# Patient Record
Sex: Female | Born: 1960 | Race: White | Hispanic: No | Marital: Single | Smoking: Light tobacco smoker
Health system: Southern US, Community
[De-identification: ages and names within clinical notes are randomized; demographics above are authoritative.]

## PROBLEM LIST (undated history)

## (undated) DIAGNOSIS — F329 Major depressive disorder, single episode, unspecified: Secondary | ICD-10-CM

## (undated) DIAGNOSIS — E785 Hyperlipidemia, unspecified: Secondary | ICD-10-CM

## (undated) DIAGNOSIS — H04129 Dry eye syndrome of unspecified lacrimal gland: Secondary | ICD-10-CM

## (undated) DIAGNOSIS — G40909 Epilepsy, unspecified, not intractable, without status epilepticus: Secondary | ICD-10-CM

## (undated) DIAGNOSIS — F319 Bipolar disorder, unspecified: Secondary | ICD-10-CM

## (undated) DIAGNOSIS — G43909 Migraine, unspecified, not intractable, without status migrainosus: Secondary | ICD-10-CM

## (undated) DIAGNOSIS — C801 Malignant (primary) neoplasm, unspecified: Secondary | ICD-10-CM

## (undated) DIAGNOSIS — R569 Unspecified convulsions: Secondary | ICD-10-CM

## (undated) DIAGNOSIS — I82409 Acute embolism and thrombosis of unspecified deep veins of unspecified lower extremity: Secondary | ICD-10-CM

## (undated) DIAGNOSIS — F419 Anxiety disorder, unspecified: Secondary | ICD-10-CM

## (undated) DIAGNOSIS — R238 Other skin changes: Secondary | ICD-10-CM

## (undated) DIAGNOSIS — R45851 Suicidal ideations: Secondary | ICD-10-CM

## (undated) DIAGNOSIS — I729 Aneurysm of unspecified site: Secondary | ICD-10-CM

## (undated) DIAGNOSIS — F32A Depression, unspecified: Secondary | ICD-10-CM

## (undated) DIAGNOSIS — R682 Dry mouth, unspecified: Secondary | ICD-10-CM

## (undated) DIAGNOSIS — K219 Gastro-esophageal reflux disease without esophagitis: Secondary | ICD-10-CM

## (undated) DIAGNOSIS — R233 Spontaneous ecchymoses: Secondary | ICD-10-CM

## (undated) DIAGNOSIS — I10 Essential (primary) hypertension: Secondary | ICD-10-CM

## (undated) DIAGNOSIS — I639 Cerebral infarction, unspecified: Secondary | ICD-10-CM

## (undated) DIAGNOSIS — R5383 Other fatigue: Secondary | ICD-10-CM

## (undated) DIAGNOSIS — E039 Hypothyroidism, unspecified: Secondary | ICD-10-CM

## (undated) HISTORY — PX: RADICAL HYSTERECTOMY: SHX2283

## (undated) HISTORY — PX: ABDOMINAL HYSTERECTOMY: SHX81

## (undated) HISTORY — DX: Gastro-esophageal reflux disease without esophagitis: K21.9

## (undated) HISTORY — DX: Anxiety disorder, unspecified: F41.9

## (undated) HISTORY — DX: Bipolar disorder, unspecified: F31.9

## (undated) HISTORY — DX: Epilepsy, unspecified, not intractable, without status epilepticus: G40.909

## (undated) HISTORY — DX: Hyperlipidemia, unspecified: E78.5

## (undated) HISTORY — DX: Depression, unspecified: F32.A

## (undated) HISTORY — DX: Migraine, unspecified, not intractable, without status migrainosus: G43.909

## (undated) HISTORY — DX: Hypothyroidism, unspecified: E03.9

## (undated) HISTORY — PX: CHOLECYSTECTOMY: SHX55

## (undated) HISTORY — DX: Major depressive disorder, single episode, unspecified: F32.9

## (undated) HISTORY — DX: Suicidal ideations: R45.851

---

## 1995-04-25 DIAGNOSIS — I639 Cerebral infarction, unspecified: Secondary | ICD-10-CM

## 1995-04-25 HISTORY — DX: Cerebral infarction, unspecified: I63.9

## 1995-04-25 HISTORY — PX: CEREBRAL ANEURYSM REPAIR: SHX164

## 2004-02-02 ENCOUNTER — Emergency Department: Payer: Self-pay | Admitting: Emergency Medicine

## 2004-02-03 ENCOUNTER — Ambulatory Visit: Payer: Self-pay | Admitting: Surgery

## 2004-02-26 ENCOUNTER — Other Ambulatory Visit: Payer: Self-pay

## 2004-02-26 ENCOUNTER — Emergency Department: Payer: Self-pay | Admitting: Internal Medicine

## 2004-03-03 ENCOUNTER — Emergency Department: Payer: Self-pay | Admitting: Emergency Medicine

## 2004-03-16 ENCOUNTER — Emergency Department: Payer: Self-pay | Admitting: Emergency Medicine

## 2004-03-16 ENCOUNTER — Other Ambulatory Visit: Payer: Self-pay

## 2006-08-20 ENCOUNTER — Ambulatory Visit: Payer: Self-pay | Admitting: *Deleted

## 2006-08-20 ENCOUNTER — Inpatient Hospital Stay (HOSPITAL_COMMUNITY): Admission: AD | Admit: 2006-08-20 | Discharge: 2006-08-27 | Payer: Self-pay | Admitting: *Deleted

## 2006-10-19 ENCOUNTER — Emergency Department (HOSPITAL_COMMUNITY): Admission: EM | Admit: 2006-10-19 | Discharge: 2006-10-19 | Payer: Self-pay | Admitting: Emergency Medicine

## 2006-10-22 ENCOUNTER — Emergency Department (HOSPITAL_COMMUNITY): Admission: EM | Admit: 2006-10-22 | Discharge: 2006-10-22 | Payer: Self-pay | Admitting: Emergency Medicine

## 2007-05-25 ENCOUNTER — Emergency Department (HOSPITAL_COMMUNITY): Admission: EM | Admit: 2007-05-25 | Discharge: 2007-05-25 | Payer: Self-pay | Admitting: Emergency Medicine

## 2007-08-10 ENCOUNTER — Emergency Department (HOSPITAL_COMMUNITY): Admission: EM | Admit: 2007-08-10 | Discharge: 2007-08-10 | Payer: Self-pay | Admitting: Emergency Medicine

## 2007-08-15 ENCOUNTER — Other Ambulatory Visit: Payer: Self-pay

## 2007-08-15 ENCOUNTER — Ambulatory Visit: Payer: Self-pay | Admitting: *Deleted

## 2007-08-16 ENCOUNTER — Inpatient Hospital Stay (HOSPITAL_COMMUNITY): Admission: RE | Admit: 2007-08-16 | Discharge: 2007-08-27 | Payer: Self-pay | Admitting: *Deleted

## 2007-09-20 ENCOUNTER — Emergency Department (HOSPITAL_COMMUNITY): Admission: EM | Admit: 2007-09-20 | Discharge: 2007-09-20 | Payer: Self-pay | Admitting: Emergency Medicine

## 2007-11-12 ENCOUNTER — Emergency Department (HOSPITAL_COMMUNITY): Admission: EM | Admit: 2007-11-12 | Discharge: 2007-11-12 | Payer: Self-pay | Admitting: Emergency Medicine

## 2007-11-22 ENCOUNTER — Emergency Department (HOSPITAL_COMMUNITY): Admission: EM | Admit: 2007-11-22 | Discharge: 2007-11-22 | Payer: Self-pay | Admitting: Occupational Medicine

## 2007-11-25 ENCOUNTER — Emergency Department (HOSPITAL_COMMUNITY): Admission: EM | Admit: 2007-11-25 | Discharge: 2007-11-25 | Payer: Self-pay | Admitting: Emergency Medicine

## 2007-12-19 ENCOUNTER — Inpatient Hospital Stay (HOSPITAL_COMMUNITY): Admission: EM | Admit: 2007-12-19 | Discharge: 2007-12-22 | Payer: Self-pay | Admitting: Emergency Medicine

## 2007-12-19 ENCOUNTER — Encounter (INDEPENDENT_AMBULATORY_CARE_PROVIDER_SITE_OTHER): Payer: Self-pay | Admitting: Neurology

## 2007-12-20 ENCOUNTER — Encounter (INDEPENDENT_AMBULATORY_CARE_PROVIDER_SITE_OTHER): Payer: Self-pay | Admitting: Neurology

## 2008-01-15 ENCOUNTER — Inpatient Hospital Stay (HOSPITAL_COMMUNITY): Admission: EM | Admit: 2008-01-15 | Discharge: 2008-01-16 | Payer: Self-pay | Admitting: Cardiovascular Disease

## 2008-01-28 ENCOUNTER — Emergency Department (HOSPITAL_COMMUNITY): Admission: EM | Admit: 2008-01-28 | Discharge: 2008-01-28 | Payer: Self-pay | Admitting: Emergency Medicine

## 2008-02-12 ENCOUNTER — Other Ambulatory Visit: Payer: Self-pay | Admitting: Emergency Medicine

## 2008-02-12 ENCOUNTER — Ambulatory Visit: Payer: Self-pay | Admitting: *Deleted

## 2008-02-12 ENCOUNTER — Inpatient Hospital Stay (HOSPITAL_COMMUNITY): Admission: AD | Admit: 2008-02-12 | Discharge: 2008-02-26 | Payer: Self-pay | Admitting: *Deleted

## 2008-02-17 ENCOUNTER — Encounter: Payer: Self-pay | Admitting: Emergency Medicine

## 2008-02-24 ENCOUNTER — Other Ambulatory Visit: Payer: Self-pay | Admitting: *Deleted

## 2008-03-06 ENCOUNTER — Other Ambulatory Visit: Payer: Self-pay | Admitting: Emergency Medicine

## 2008-03-07 ENCOUNTER — Other Ambulatory Visit: Payer: Self-pay | Admitting: Family Medicine

## 2008-03-07 ENCOUNTER — Inpatient Hospital Stay (HOSPITAL_COMMUNITY): Admission: EM | Admit: 2008-03-07 | Discharge: 2008-03-12 | Payer: Self-pay | Admitting: Psychiatry

## 2010-05-28 ENCOUNTER — Emergency Department (HOSPITAL_COMMUNITY)
Admission: EM | Admit: 2010-05-28 | Discharge: 2010-05-28 | Disposition: A | Discharge status: Home / Self Care | Payer: Medicare Other | Attending: Emergency Medicine | Admitting: Emergency Medicine

## 2010-05-28 DIAGNOSIS — H53149 Visual discomfort, unspecified: Secondary | ICD-10-CM | POA: Insufficient documentation

## 2010-05-28 DIAGNOSIS — G43909 Migraine, unspecified, not intractable, without status migrainosus: Secondary | ICD-10-CM | POA: Insufficient documentation

## 2010-05-28 DIAGNOSIS — R630 Anorexia: Secondary | ICD-10-CM | POA: Insufficient documentation

## 2010-05-28 DIAGNOSIS — R110 Nausea: Secondary | ICD-10-CM | POA: Insufficient documentation

## 2010-05-28 DIAGNOSIS — Z79899 Other long term (current) drug therapy: Secondary | ICD-10-CM | POA: Insufficient documentation

## 2010-05-28 DIAGNOSIS — G40909 Epilepsy, unspecified, not intractable, without status epilepticus: Secondary | ICD-10-CM | POA: Insufficient documentation

## 2010-06-02 ENCOUNTER — Inpatient Hospital Stay (HOSPITAL_COMMUNITY): Payer: Medicare Other

## 2010-06-02 ENCOUNTER — Inpatient Hospital Stay (HOSPITAL_COMMUNITY)
Admission: EM | Admit: 2010-06-02 | Discharge: 2010-06-04 | Disposition: A | Discharge status: Home / Self Care | Payer: Medicare Other | Source: Home / Self Care

## 2010-06-02 ENCOUNTER — Emergency Department (HOSPITAL_COMMUNITY): DRG: 313 | Payer: Medicare Other | Attending: Internal Medicine

## 2010-06-02 ENCOUNTER — Emergency Department (HOSPITAL_COMMUNITY): Payer: Medicare Other

## 2010-06-02 DIAGNOSIS — F319 Bipolar disorder, unspecified: Secondary | ICD-10-CM | POA: Diagnosis present

## 2010-06-02 DIAGNOSIS — T80818A Extravasation of other vesicant agent, initial encounter: Secondary | ICD-10-CM | POA: Diagnosis present

## 2010-06-02 DIAGNOSIS — G40909 Epilepsy, unspecified, not intractable, without status epilepticus: Secondary | ICD-10-CM | POA: Diagnosis present

## 2010-06-02 DIAGNOSIS — IMO0002 Reserved for concepts with insufficient information to code with codable children: Secondary | ICD-10-CM | POA: Diagnosis present

## 2010-06-02 DIAGNOSIS — Z8249 Family history of ischemic heart disease and other diseases of the circulatory system: Secondary | ICD-10-CM

## 2010-06-02 DIAGNOSIS — T50995A Adverse effect of other drugs, medicaments and biological substances, initial encounter: Secondary | ICD-10-CM | POA: Diagnosis present

## 2010-06-02 DIAGNOSIS — R0789 Other chest pain: Secondary | ICD-10-CM | POA: Diagnosis present

## 2010-06-02 DIAGNOSIS — E039 Hypothyroidism, unspecified: Secondary | ICD-10-CM | POA: Diagnosis present

## 2010-06-02 DIAGNOSIS — I10 Essential (primary) hypertension: Secondary | ICD-10-CM | POA: Diagnosis present

## 2010-06-02 DIAGNOSIS — E7800 Pure hypercholesterolemia, unspecified: Secondary | ICD-10-CM | POA: Diagnosis present

## 2010-06-02 DIAGNOSIS — Z86718 Personal history of other venous thrombosis and embolism: Secondary | ICD-10-CM

## 2010-06-02 HISTORY — DX: Essential (primary) hypertension: I10

## 2010-06-02 HISTORY — DX: Malignant (primary) neoplasm, unspecified: C80.1

## 2010-06-02 LAB — DIFFERENTIAL
Lymphocytes Relative: 26 % (ref 12–46)
Lymphs Abs: 2.1 10*3/uL (ref 0.7–4.0)
Neutrophils Relative %: 65 % (ref 43–77)

## 2010-06-02 LAB — CBC
HCT: 34.8 % — ABNORMAL LOW (ref 36.0–46.0)
MCV: 84.3 fL (ref 78.0–100.0)
Platelets: 275 10*3/uL (ref 150–400)
RBC: 4.13 MIL/uL (ref 3.87–5.11)
WBC: 8.1 10*3/uL (ref 4.0–10.5)

## 2010-06-02 LAB — BASIC METABOLIC PANEL
BUN: 10 mg/dL (ref 6–23)
Chloride: 103 mEq/L (ref 96–112)
Glucose, Bld: 97 mg/dL (ref 70–99)
Potassium: 3.1 mEq/L — ABNORMAL LOW (ref 3.5–5.1)

## 2010-06-02 LAB — POCT CARDIAC MARKERS
CKMB, poc: 1.3 ng/mL (ref 1.0–8.0)
Myoglobin, poc: 106 ng/mL (ref 12–200)
Troponin i, poc: <0.05 ng/mL (ref 0.00–0.09)

## 2010-06-02 LAB — PHENYTOIN LEVEL, TOTAL: Phenytoin Lvl: 6.2 ug/mL — ABNORMAL LOW (ref 10.0–20.0)

## 2010-06-03 ENCOUNTER — Observation Stay (HOSPITAL_COMMUNITY): Payer: Medicare Other

## 2010-06-03 ENCOUNTER — Encounter (HOSPITAL_COMMUNITY): Payer: Self-pay

## 2010-06-03 DIAGNOSIS — R072 Precordial pain: Secondary | ICD-10-CM

## 2010-06-03 LAB — LIPID PANEL
Cholesterol: 215 mg/dL — ABNORMAL HIGH (ref 0–200)
Total CHOL/HDL Ratio: 4.9 RATIO
VLDL: 60 mg/dL — ABNORMAL HIGH (ref 0–40)

## 2010-06-03 LAB — TSH: TSH: 1.703 u[IU]/mL (ref 0.350–4.500)

## 2010-06-03 LAB — CARDIAC PANEL(CRET KIN+CKTOT+MB+TROPI)
CK, MB: 1.4 ng/mL (ref 0.3–4.0)
Relative Index: INVALID (ref 0.0–2.5)
Relative Index: INVALID (ref 0.0–2.5)
Total CK: 120 U/L (ref 7–177)
Troponin I: 0.02 ng/mL (ref 0.00–0.06)

## 2010-06-03 MED ORDER — TECHNETIUM TO 99M ALBUMIN AGGREGATED
5.0000 | Freq: Once | INTRAVENOUS | Status: AC | PRN
  Administered 2010-06-03: 5.4 via INTRAVENOUS

## 2010-06-03 MED ORDER — XENON XE 133 GAS
10.0000 | GAS_FOR_INHALATION | Freq: Once | RESPIRATORY_TRACT | Status: AC | PRN
  Administered 2010-06-03: 16.1 via RESPIRATORY_TRACT

## 2010-06-04 LAB — DIFFERENTIAL
Basophils Absolute: 0 10*3/uL (ref 0.0–0.1)
Basophils Relative: 1 % (ref 0–1)
Neutro Abs: 2.3 10*3/uL (ref 1.7–7.7)
Neutrophils Relative %: 48 % (ref 43–77)

## 2010-06-04 LAB — CBC
Hemoglobin: 11.9 g/dL — ABNORMAL LOW (ref 12.0–15.0)
Platelets: 234 10*3/uL (ref 150–400)
RBC: 3.89 MIL/uL (ref 3.87–5.11)
WBC: 4.8 10*3/uL (ref 4.0–10.5)

## 2010-06-04 LAB — COMPREHENSIVE METABOLIC PANEL
ALT: 44 U/L — ABNORMAL HIGH (ref 0–35)
AST: 37 U/L (ref 0–37)
Albumin: 3.4 g/dL — ABNORMAL LOW (ref 3.5–5.2)
CO2: 27 mEq/L (ref 19–32)
Chloride: 109 mEq/L (ref 96–112)
GFR calc Af Amer: >60 mL/min (ref >60)
GFR calc non Af Amer: 53 mL/min — ABNORMAL LOW (ref >60)
Sodium: 145 mEq/L (ref 135–145)
Total Bilirubin: 0.4 mg/dL (ref 0.3–1.2)

## 2010-06-05 NOTE — Discharge Summary (Signed)
[place], [place] Jensen            ACCOUNT NO.:  [id number]  MEDICAL RECORD NO.:  [id number]           PATIENT TYPE:  I  LOCATION:  A310                          FACILITY:  [facility]  PHYSICIAN:  Wilson Singer, M.D.DATE OF BIRTH:  [birth year]  DATE OF ADMISSION:  06/02/2010 DATE OF DISCHARGE:  02/11/2012LH                              DISCHARGE SUMMARY   FINAL DISCHARGE DIAGNOSES: 1. Atypical chest pain, cardiac ischemia ruled out. 2. No evidence of pulmonary embolism.  CONDITION ON DISCHARGE:  Stable.  MEDICATIONS ON DISCHARGE:  Continue home medications which include Risperdal 1 mg daily, TriCor 145 mg daily, verapamil SR 360 mg daily, Celexa 40 mg  nightly, spironolactone 100 mg daily, labetalol 200 mg b.i.d., Lipitor 20 mg nightly, Lasix 40 mg daily, Ambien 10 mg nightly, Synthroid 75 mcg daily, and Dilantin 200 mg b.i.d.  NEW MEDICATION:  Levaquin 5 mg daily for 5 days.  HISTORY:  This 50 year old lady was admitted to the hospital with complaints of substernal chest pain and pressure.  She described the pain as sharp in nature but not necessarily pleuritic.  Please see initial history and physical examination by Dr. Marguerite Olea.  HOSPITAL PROGRESS:  The patient was admitted and serial cardiac enzymes were done which were negative.  Electrocardiogram showed normal sinus rhythm with incomplete right bundle-branch block, nonspecific T-wave changes.  The patient had recently had a cardiac stress test within the last couple of months ago, and this was negative.  When the patient was reviewed in the emergency room, she had an elevated D-dimer at 0.86.  A CT angiogram was attempted but the dye that was used extravasated into the left forearm, and therefore the procedure was abandoned.  Subsequently, the patient underwent a V/Q scan which showed a very low probability of an acute pulmonary embolism.  In fact her history is not consistent with this either.  She also underwent  an echocardiogram while in the hospital and this showed that she had normal ejection fraction and normal systolic function with ejection fraction of 60-65%.  Chest x-ray was also done which showed no evidence of cardiopulmonary disease.  The patient's chest pain settled down and on the day of discharge she was stable.  She did not have any further complaints of chest pain.  PHYSICAL EXAMINATION:  VITAL SIGNS:  On physical examination, temperature 98.2, blood pressure 142/75, pulse 82, saturation 96% on room air. CARDIOVASCULAR:  Heart sounds are present and normal with no pericardial rub. CHEST:  Lung fields were clear with no evidence of pleural rub. ABDOMEN:  Soft and nontender with no hepatosplenomegaly.  LABORATORY DATA:  Lab work showed TSH in the normal range of 1.703.  A lipid panel did show elevation of triglycerides at 299.  As mentioned before, cardiac markers were negative.  Electrolytes showed a normal BUN of 10 and creatinine of 1.03, hemoglobin 12.6, white blood cell count 8.1, and platelets 275.  DISPOSITION:  The patient is stable to be discharged home and examination of the left elbow area and forearm area where dye was given for the CT angiogram did not show any convincing evidence of cellulitis, but I  suppose there may be mild infection, and therefore I am going to send her home on a 5-day course of Levaquin.  She does not have a primary care physician in town at the present time because she just moved here from [place].  We will give her name to follow up with as an unassigned patient and hopefully she will do this.     Wilson Singer, M.D.     NCG/MEDQ  D:  06/04/2010  T:  06/04/2010  Job:  [id number]  Electronically Signed by Lilly Cove M.D. on 06/05/2010 06:51:19 AM

## 2010-06-14 NOTE — H&P (Signed)
NAME:  Robin Jensen, Robin Jensen NO.:  [id number]  MEDICAL RECORD NO.:  [id number]           PATIENT TYPE:  E  LOCATION:  APED                          FACILITY:  [facility]  PHYSICIAN:  Houston Siren, MD           DATE OF BIRTH:  1961-02-13  DATE OF ADMISSION:  06/02/2010 DATE OF DISCHARGE:  LH                             HISTORY & PHYSICAL   PRIMARY CARE PHYSICIAN:  Unassigned to hospitalist.  REASON FOR ADMISSION:  Chest pain.  Advance directive is full code.  HISTORY OF PRESENT ILLNESS:  This is a 50 year old female with history of bipolar disorder and other psychiatric disorders with prior suicide attempt, hypothyroidism, seizure disorder, hypercholesterolemia, polysubstance abuse, presented to the emergency room with 1-2 hours of substernal chest pain and pressure.  She had some nausea but no vomiting and her pain is nonpleuritic.  She denies any diaphoresis, shortness of breath, fever, chills or cough..  Of note, 1 week ago she was admitted to [facility] for chest pain and was seen in consultation with Cardiology who felt that her pain was noncardiac.  She also reports having a "negative stress test" several months ago.  I do not have a copy of the stress test to verify this.  She stated her pain came at rest and had no radiation.  Workup in the emergency room included a D-dimer which was slightly elevated at 0.86.  EKG showed nonspecific ST-T changes in the precordial leads.  Her chest x-ray is clear and her cardiac markers were negative.  She had normal white count and normal hemoglobin of 12.6.  She stated she had 2 episodes of what sound like a seizure where she lost her bladder and was tremulous, followed by feeling somnolence, but she said that she never lost consciousness.  Her Dilantin level was low at 6.2 and she was given 500 mg IV bolus.  She was not able to get a CT pulmonary angiogram because she has dye extravasation in her left arm.   Surgeon Dr. Lovell Sheehan was consulted.  Currently, she is comfortable, not short of breath and hospitalist was asked to admit the patient because of her chest pain and recent questionable seizure.  PAST MEDICAL HISTORY:  Bipolar disorder, posttraumatic stress syndrome, depression, hypothyroidism, history of cerebral aneurysm, cerebral clipping in 1996, hypertension, seizure disorder, history of lower extremity DVT.  SOCIAL HISTORY:  She denied current use of tobacco or alcohol products. She denied cocaine and marijuana use but it should be noted that her UDS showed presence of THC.  FAMILY HISTORY:  Father died of coronary artery disease in his 80.  She has hypertension and diabetes.  MEDICATIONS: 1. Dilantin 200 mg b.i.d. 2. Synthroid 75 mcg per day. 3. Ambien. 4. Lasix 40 mg per day. 5. Lipitor 20 mg per day. 6. Labetalol 200 mg b.i.d. 7. Spironolactone 100 mg daily. 8. Celexa 40 mg nightly. 9. Verapamil 300 mg daily. 10.TriCor 145 mg per day. 11.Risperdal 1 mg daily.  REVIEW OF SYSTEMS:  Otherwise unremarkable.  PHYSICAL EXAMINATION:  VITAL SIGNS:  Blood pressure 130/80, pulse of 80, respiratory rate  of 18, temperature 98.4. GENERAL:  She is alert and oriented, is in no apparent distress.  She appears slightly lethargic but answers questions appropriately.  HEENT: She has facial symmetry and her speech is fluent.  Tongue is midline. NECK:  Supple.  No stridor. CARDIAC:  S1 and S2, regular.  I did not hear any murmur, rub or gallop. LUNGS:  Clear.  No wheezes, rales or any evidence of consolidation. ABDOMEN:  Soft, nondistended, nontender. EXTREMITIES:  No edema.  No calf tenderness.  Good distal pulses bilaterally. SKIN:  Warm and dry. NEUROLOGIC:  Unremarkable. PSYCHIATRIC:  Unremarkable as well.  OBJECTIVE FINDINGS:  EKG; sinus rhythm at 80, axis is 0 degrees.  She has ST depression in the precordial leads from V1-V5 but no acute ST-T changes.  D-dimer 0.86.  Chest  x-ray is clear.  White count of 8.1 thousand, hemoglobin of 12.6, potassium of 3.1, creatinine 1.03, Dilantin 6.2.  IMPRESSION:  This is a 50 year old female presented with recurrent atypical chest pains.  She does have significantly increased risks for coronary artery disease.  We will admit her for rule out.  Because she has history of deep vein thrombosis, and the fact that she has elevated D- dimer albeit slightly, we will put her on full-dose Lovenox, and we will get a VQ scan tomorrow.  With respect to her cardiac medication, we will place her on aspirin, continue her beta-blocker and continue her statin. Differentials would include esophageal spasm and even Prinzmetal angina. I do not think she has acute coronary artery syndrome.  Having said that, recurrent atypical chest pains is certainly an indication for cardiac catheterization to define anatomy as well.  With this thought in mind, please consult Cardiology tomorrow.  She did have a questionable seizure today and her Dilantin level is low at 6.2.  She was given 500 mg IV bolus, and I would like to increase her total daily Dilantin from 400 to 450 mg in divided dosages.  She is otherwise stable.  I will continue all of her other medications as well.  We will give her 1 inch of Nitropaste to chest wall.  Because she has hypothyroidism, we will continue Synthroid and check a TSH.  She is a full code, will be admitted to [facility].  PROBLEM LIST: 1. Atypical chest pain, recurrent 2. Family history of coronary artery disease. 3. Seizure with history of seizure, subtherapeutic Dilantin, bipolar     disorder and other psychiatric problems, hypercholesterolemia,     hypothyroidism, history of polysubstance abuse, rule out pulmonary     embolism, left arm dye extravasation, full code.     Houston Siren, MD     PL/MEDQ  D:  06/02/2010  T:  06/02/2010  Job:  [id number]  Electronically Signed by Houston Siren  on 06/14/2010  03:09:25 AM

## 2010-07-14 ENCOUNTER — Observation Stay (HOSPITAL_COMMUNITY)
Admission: EM | Admit: 2010-07-14 | Discharge: 2010-07-16 | Disposition: A | Discharge status: Home / Self Care | Payer: Medicare Other | Attending: Internal Medicine | Admitting: Internal Medicine

## 2010-07-14 ENCOUNTER — Emergency Department (HOSPITAL_COMMUNITY): Payer: Medicare Other

## 2010-07-14 DIAGNOSIS — F319 Bipolar disorder, unspecified: Secondary | ICD-10-CM | POA: Insufficient documentation

## 2010-07-14 DIAGNOSIS — E039 Hypothyroidism, unspecified: Secondary | ICD-10-CM | POA: Insufficient documentation

## 2010-07-14 DIAGNOSIS — F3289 Other specified depressive episodes: Secondary | ICD-10-CM | POA: Insufficient documentation

## 2010-07-14 DIAGNOSIS — F411 Generalized anxiety disorder: Secondary | ICD-10-CM | POA: Insufficient documentation

## 2010-07-14 DIAGNOSIS — R0789 Other chest pain: Principal | ICD-10-CM | POA: Insufficient documentation

## 2010-07-14 DIAGNOSIS — Z79899 Other long term (current) drug therapy: Secondary | ICD-10-CM | POA: Insufficient documentation

## 2010-07-14 DIAGNOSIS — F329 Major depressive disorder, single episode, unspecified: Secondary | ICD-10-CM | POA: Insufficient documentation

## 2010-07-14 DIAGNOSIS — Z23 Encounter for immunization: Secondary | ICD-10-CM | POA: Insufficient documentation

## 2010-07-14 DIAGNOSIS — I10 Essential (primary) hypertension: Secondary | ICD-10-CM | POA: Insufficient documentation

## 2010-07-14 DIAGNOSIS — E785 Hyperlipidemia, unspecified: Secondary | ICD-10-CM | POA: Insufficient documentation

## 2010-07-14 LAB — POCT CARDIAC MARKERS
CKMB, poc: <1 ng/mL — ABNORMAL LOW (ref 1.0–8.0)
CKMB, poc: <1 ng/mL — ABNORMAL LOW (ref 1.0–8.0)
Myoglobin, poc: 144 ng/mL (ref 12–200)
Myoglobin, poc: 153 ng/mL (ref 12–200)

## 2010-07-14 LAB — BASIC METABOLIC PANEL
Calcium: 9.5 mg/dL (ref 8.4–10.5)
Creatinine, Ser: 1.01 mg/dL (ref 0.4–1.2)
GFR calc Af Amer: >60 mL/min (ref >60)
Sodium: 136 mEq/L (ref 135–145)

## 2010-07-14 LAB — URINALYSIS, ROUTINE W REFLEX MICROSCOPIC
Bilirubin Urine: NEGATIVE
Glucose, UA: NEGATIVE mg/dL
Hgb urine dipstick: NEGATIVE
Ketones, ur: NEGATIVE mg/dL
Protein, ur: NEGATIVE mg/dL

## 2010-07-14 LAB — DIFFERENTIAL
Basophils Absolute: 0 10*3/uL (ref 0.0–0.1)
Basophils Relative: 0 % (ref 0–1)
Eosinophils Absolute: 0 10*3/uL (ref 0.0–0.7)
Monocytes Relative: 8 % (ref 3–12)
Neutro Abs: 5.4 10*3/uL (ref 1.7–7.7)
Neutrophils Relative %: 64 % (ref 43–77)

## 2010-07-14 LAB — HEPATIC FUNCTION PANEL
AST: 27 U/L (ref 0–37)
Bilirubin, Direct: 0.1 mg/dL (ref 0.0–0.3)
Indirect Bilirubin: 0.3 mg/dL (ref 0.3–0.9)
Total Bilirubin: 0.4 mg/dL (ref 0.3–1.2)

## 2010-07-14 LAB — CBC
MCH: 29.7 pg (ref 26.0–34.0)
MCHC: 34.5 g/dL (ref 30.0–36.0)
Platelets: 271 10*3/uL (ref 150–400)
RBC: 4.31 MIL/uL (ref 3.87–5.11)

## 2010-07-14 LAB — RAPID URINE DRUG SCREEN, HOSP PERFORMED
Amphetamines: NOT DETECTED
Benzodiazepines: POSITIVE — AB
Cocaine: NOT DETECTED
Opiates: NOT DETECTED
Tetrahydrocannabinol: NOT DETECTED

## 2010-07-14 LAB — LIPASE, BLOOD: Lipase: 23 U/L (ref 11–59)

## 2010-07-15 ENCOUNTER — Observation Stay (HOSPITAL_COMMUNITY): Payer: Medicaid Other

## 2010-07-15 ENCOUNTER — Encounter (HOSPITAL_COMMUNITY): Payer: Self-pay

## 2010-07-15 ENCOUNTER — Observation Stay (HOSPITAL_COMMUNITY): Payer: Medicare Other

## 2010-07-15 DIAGNOSIS — R079 Chest pain, unspecified: Secondary | ICD-10-CM

## 2010-07-15 LAB — CARDIAC PANEL(CRET KIN+CKTOT+MB+TROPI)
Relative Index: 1.3 (ref 0.0–2.5)
Relative Index: 1.3 (ref 0.0–2.5)
Troponin I: 0.02 ng/mL (ref 0.00–0.06)
Troponin I: 0.02 ng/mL (ref 0.00–0.06)
Troponin I: 0.03 ng/mL (ref 0.00–0.06)

## 2010-07-15 MED ORDER — TECHNETIUM TC 99M TETROFOSMIN IV KIT
30.0000 | PACK | Freq: Once | INTRAVENOUS | Status: AC | PRN
  Administered 2010-07-15: 31 via INTRAVENOUS

## 2010-07-15 MED ORDER — TECHNETIUM TC 99M TETROFOSMIN IV KIT
10.0000 | PACK | Freq: Once | INTRAVENOUS | Status: AC | PRN
  Administered 2010-07-15: 11 via INTRAVENOUS

## 2010-07-15 MED ORDER — TECHNETIUM TC 99M MEBROFENIN IV KIT: 5.0000 | PACK | Freq: Once | INTRAVENOUS | Status: AC | PRN

## 2010-07-15 NOTE — Consult Note (Signed)
[place]  ADEA, GEISEL NO.:  [id number]  MEDICAL RECORD NO.:  [id number]           PATIENT TYPE:  LOCATION:                                 FACILITY:  PHYSICIAN:  Jonelle Sidle, MD DATE OF BIRTH:  [birth year]  DATE OF CONSULTATION: DATE OF DISCHARGE:                                CONSULTATION   ADDENDUM:  REQUESTING PHYSICIAN:  Corinna L. Lendell Caprice, MD with the [facility].  REASON FOR CONSULTATION:  Chest pain.  Please refer to the dictated Cardiology consultation note by Ms. Joni Reining, Nurse Practitioner.  In summary, Ms. [place] is a 50 year old obese woman with history of hyperlipidemia; hypothyroidism; psychiatric illness including borderline personality disorder as well as post-traumatic stress disorder, bipolar disorder and psychosis; prior documentation of polysubstance abuse; and recurring episodes of chest pain based on record review.  She has been seen at [facility] over the years based on history and physical, and the patient reports stress testing done at that facility back in 2010, reportedly nondiagnostic based on a description in the history and physical, actual report not available to me for review. She has no clearly documented history of obstructive coronary artery disease or myocardial infarction.  She is now admitted to the hospital describing chest discomfort, specifically a pressure, with radiation to the left arm, also mild shortness of breath, weakness, and nausea.  This occurred while she was sitting on her couch.  She has used sublingual nitroglycerin, not specifically with relief; however, although at this point she is symptom free.  She has been admitted for observation and cardiac markers have been normal with a peak troponin-I level of 0.02 and a peak CK-MB relative index of only 1.3.  Electrocardiogram shows somewhat low voltage with decreased anterior R-wave progression.   Urine drug screen is positive for benzodiazepines, otherwise negative.  Her chest x-ray reports no active lung disease with normal mediastinal contours and normal heart size.  We have been asked to evaluate her further.  PHYSICAL EXAMINATION:  VITAL SIGNS:  Temperature is 97.8 degrees, heart rate 77 in sinus rhythm, respirations 18, blood pressure is 132/85, oxygen saturation is 97% on 2 L nasal cannula.  Weight not recorded. GENERAL:  This is an obese woman in no acute distress, presently without chest pain. HEENT:  Conjunctivae and lids are normal.  Oropharynx with moist mucosa. NECK:  Supple.  No elevated JVP or carotid bruits.  No thyromegaly. LUNGS:  Clear without labored breathing at rest. CARDIAC:  Regular rate and rhythm.  No S3, gallop or pericardial rub. No significant systolic murmur. ABDOMEN:  Obese, nontender.  Bowel sounds present.  No guarding or rebound. EXTREMITIES:  Exhibit no frank pitting edema.  Distal pulses are 1-2+. SKIN:  Warm and dry. MUSCULOSKELETAL:  No kyphosis is noted. NEUROPSYCHIATRIC:  The patient is alert and oriented x3, affect is somewhat flat.  Moves all extremities equally.  LABORATORY DATA:  WBC is 8.5, hemoglobin 12.8, platelets 271.  Potassium 3.5, BUN 9, creatinine 1.0, AST 27, ALT 20, amylase 75, lipase 23. Urine hCG negative.  Phenytoin level low at 7.0.  Urinalysis essentially normal.  IMPRESSION: 1. Chest pain syndrome as outlined, some described features typical for angina,     however, objectively with normal cardiac markers and a nonspecific     electrocardiogram.  Limited history indicates prior workup for     chest pain, principally at other facilities, reportedly with a     nondiagnostic stress test of some type at [facility] in 2010.  We have been asked to evaluate the patient,     specifically to arrange followup stress testing.  An echocardiogram     has also been ordered. 2. Obesity. 3.  Hyperlipidemia. 4. History of psychiatric illness as outlined above. 5. Hypothyroidism, recent TSH within normal limits. 6. Reported history of hypertension. 7. Prior history of substance abuse.  RECOMMENDATIONS:  Plan was discussed with the patient.  We are scheduling an inpatient Lexiscan Myoview for today, and will also follow up on the 2-D echocardiogram that has been ordered.  Further recommendations to follow.     Jonelle Sidle, MD     SGM/MEDQ  D:  07/15/2010  T:  07/15/2010  Job:  [id number]  Electronically Signed by Nona Dell MD on 07/15/2010 03:02:03 PM

## 2010-07-16 ENCOUNTER — Observation Stay (HOSPITAL_COMMUNITY): Payer: Medicaid Other

## 2010-07-21 ENCOUNTER — Emergency Department (HOSPITAL_COMMUNITY): Payer: Medicare Other

## 2010-07-21 ENCOUNTER — Inpatient Hospital Stay (HOSPITAL_COMMUNITY)
Admission: EM | Admit: 2010-07-21 | Discharge: 2010-07-22 | DRG: 556 | Disposition: A | Discharge status: Home / Self Care | Payer: Medicare Other | Attending: Internal Medicine | Admitting: Internal Medicine

## 2010-07-21 DIAGNOSIS — K219 Gastro-esophageal reflux disease without esophagitis: Secondary | ICD-10-CM | POA: Diagnosis present

## 2010-07-21 DIAGNOSIS — G458 Other transient cerebral ischemic attacks and related syndromes: Secondary | ICD-10-CM | POA: Diagnosis present

## 2010-07-21 DIAGNOSIS — R29898 Other symptoms and signs involving the musculoskeletal system: Principal | ICD-10-CM | POA: Diagnosis present

## 2010-07-21 DIAGNOSIS — Z882 Allergy status to sulfonamides status: Secondary | ICD-10-CM

## 2010-07-21 DIAGNOSIS — F1210 Cannabis abuse, uncomplicated: Secondary | ICD-10-CM | POA: Diagnosis present

## 2010-07-21 DIAGNOSIS — E039 Hypothyroidism, unspecified: Secondary | ICD-10-CM | POA: Diagnosis present

## 2010-07-21 DIAGNOSIS — G43109 Migraine with aura, not intractable, without status migrainosus: Secondary | ICD-10-CM | POA: Diagnosis present

## 2010-07-21 DIAGNOSIS — G4700 Insomnia, unspecified: Secondary | ICD-10-CM | POA: Diagnosis present

## 2010-07-21 DIAGNOSIS — J309 Allergic rhinitis, unspecified: Secondary | ICD-10-CM | POA: Diagnosis present

## 2010-07-21 DIAGNOSIS — F064 Anxiety disorder due to known physiological condition: Secondary | ICD-10-CM | POA: Diagnosis present

## 2010-07-21 DIAGNOSIS — R0789 Other chest pain: Secondary | ICD-10-CM | POA: Diagnosis present

## 2010-07-21 DIAGNOSIS — E7800 Pure hypercholesterolemia, unspecified: Secondary | ICD-10-CM | POA: Diagnosis present

## 2010-07-21 DIAGNOSIS — R4789 Other speech disturbances: Secondary | ICD-10-CM | POA: Diagnosis present

## 2010-07-21 DIAGNOSIS — E785 Hyperlipidemia, unspecified: Secondary | ICD-10-CM | POA: Diagnosis present

## 2010-07-21 DIAGNOSIS — Z91040 Latex allergy status: Secondary | ICD-10-CM

## 2010-07-21 DIAGNOSIS — Z87891 Personal history of nicotine dependence: Secondary | ICD-10-CM

## 2010-07-21 DIAGNOSIS — F1411 Cocaine abuse, in remission: Secondary | ICD-10-CM | POA: Diagnosis present

## 2010-07-21 DIAGNOSIS — I10 Essential (primary) hypertension: Secondary | ICD-10-CM | POA: Diagnosis present

## 2010-07-21 DIAGNOSIS — G40909 Epilepsy, unspecified, not intractable, without status epilepticus: Secondary | ICD-10-CM | POA: Diagnosis present

## 2010-07-21 DIAGNOSIS — F319 Bipolar disorder, unspecified: Secondary | ICD-10-CM | POA: Diagnosis present

## 2010-07-21 DIAGNOSIS — Z880 Allergy status to penicillin: Secondary | ICD-10-CM

## 2010-07-21 DIAGNOSIS — Z7982 Long term (current) use of aspirin: Secondary | ICD-10-CM

## 2010-07-21 LAB — COMPREHENSIVE METABOLIC PANEL
ALT: 44 U/L — ABNORMAL HIGH (ref 0–35)
AST: 48 U/L — ABNORMAL HIGH (ref 0–37)
Albumin: 3.5 g/dL (ref 3.5–5.2)
Albumin: 3.3 g/dL — ABNORMAL LOW (ref 3.5–5.2)
Alkaline Phosphatase: 58 U/L (ref 39–117)
Alkaline Phosphatase: 56 U/L (ref 39–117)
CO2: 26 mEq/L (ref 19–32)
Calcium: 9 mg/dL (ref 8.4–10.5)
Chloride: 105 mEq/L (ref 96–112)
Creatinine, Ser: 0.93 mg/dL (ref 0.4–1.2)
GFR calc Af Amer: >60 mL/min (ref >60)
GFR calc non Af Amer: >60 mL/min (ref >60)
Potassium: 3.8 mEq/L (ref 3.5–5.1)
Potassium: 3.3 mEq/L — ABNORMAL LOW (ref 3.5–5.1)
Sodium: 136 mEq/L (ref 135–145)
Total Bilirubin: 0.3 mg/dL (ref 0.3–1.2)
Total Protein: 6.6 g/dL (ref 6.0–8.3)

## 2010-07-21 LAB — POCT I-STAT, CHEM 8
BUN: 7 mg/dL (ref 6–23)
Chloride: 103 mEq/L (ref 96–112)
Creatinine, Ser: 1 mg/dL (ref 0.4–1.2)
Potassium: 3.9 mEq/L (ref 3.5–5.1)
Sodium: 138 mEq/L (ref 135–145)
TCO2: 24 mmol/L (ref 0–100)

## 2010-07-21 LAB — CBC
HCT: 36.1 % (ref 36.0–46.0)
Hemoglobin: 12.4 g/dL (ref 12.0–15.0)
MCH: 29.6 pg (ref 26.0–34.0)
MCHC: 34.3 g/dL (ref 30.0–36.0)
MCV: 86.2 fL (ref 78.0–100.0)

## 2010-07-21 LAB — DIFFERENTIAL
Lymphocytes Relative: 25 % (ref 12–46)
Lymphs Abs: 1.9 10*3/uL (ref 0.7–4.0)
Monocytes Absolute: 0.6 10*3/uL (ref 0.1–1.0)
Monocytes Relative: 9 % (ref 3–12)
Neutro Abs: 4.7 10*3/uL (ref 1.7–7.7)

## 2010-07-21 LAB — PHOSPHORUS: Phosphorus: 3.1 mg/dL (ref 2.3–4.6)

## 2010-07-21 LAB — CK TOTAL AND CKMB (NOT AT ARMC)
CK, MB: 2 ng/mL (ref 0.3–4.0)
Relative Index: 0.5 (ref 0.0–2.5)
Total CK: 397 U/L — ABNORMAL HIGH (ref 7–177)

## 2010-07-22 DIAGNOSIS — R209 Unspecified disturbances of skin sensation: Secondary | ICD-10-CM

## 2010-07-22 LAB — CBC
MCV: 87.8 fL (ref 78.0–100.0)
Platelets: 233 10*3/uL (ref 150–400)
RBC: 4.03 MIL/uL (ref 3.87–5.11)
RDW: 12.8 % (ref 11.5–15.5)
WBC: 5.4 10*3/uL (ref 4.0–10.5)

## 2010-07-22 LAB — URINE DRUGS OF ABUSE SCREEN W ALC, ROUTINE (REF LAB)
Amphetamine Screen, Ur: NEGATIVE
Benzodiazepines.: POSITIVE — AB
Creatinine,U: 68.1 mg/dL
Ethyl Alcohol: <10 mg/dL (ref <10)
Marijuana Metabolite: NEGATIVE
Opiate Screen, Urine: NEGATIVE
Propoxyphene: NEGATIVE

## 2010-07-22 LAB — BASIC METABOLIC PANEL
BUN: 4 mg/dL — ABNORMAL LOW (ref 6–23)
CO2: 23 mEq/L (ref 19–32)
Chloride: 112 mEq/L (ref 96–112)
GFR calc non Af Amer: >60 mL/min (ref >60)
Glucose, Bld: 108 mg/dL — ABNORMAL HIGH (ref 70–99)
Potassium: 3.6 mEq/L (ref 3.5–5.1)
Sodium: 142 mEq/L (ref 135–145)

## 2010-07-22 LAB — LIPID PANEL
HDL: 37 mg/dL — ABNORMAL LOW (ref >39)
Total CHOL/HDL Ratio: 6 RATIO
Triglycerides: 388 mg/dL — ABNORMAL HIGH (ref <150)
VLDL: 78 mg/dL — ABNORMAL HIGH (ref 0–40)

## 2010-07-26 NOTE — Consult Note (Signed)
NAME:  Rob[place], [place] NO.:  [id number]  MEDICAL RECORD NO.:  [id number]           PATIENT TYPE:  I  LOCATION:  3015                         FACILITY:  [facility]  PHYSICIAN:  Thana Farr, MD    DATE OF BIRTH:  January 11, 1961  DATE OF CONSULTATION: DATE OF DISCHARGE:                                CONSULTATION   HISTORY:  Robin Jensen is a 50 year old female that reports that she awakened this morning not feeling well.  Was found by family members at approximately 11 o'clock with no speech.  She was having weakness on the right side as well.  EMS was called.  The patient was so weak on the right that she was unable to ambulate to the ambulance per her report. Code stroke was called.  The patient was brought in for evaluation.  The patient last seen normal time was 7 o'clock this morning.  PAST MEDICAL HISTORY: 1. Hypertension. 2. Seizure disorder. 3. Aneurysm clipping. 4. Bipolar disease. 5. Hypercholesterolemia.6. Hypothyroidism. 7. Migraines.  MEDICATIONS:  Verapamil, spironolactone, Imitrex, Zolpidem, albuterol, fenofibrate, oxcarbazepine, atorvastatin, Lasix, phenantoin, vitamin D, labetalol, polyethylene glycol, citalopram, levothyroxine and Risperdal.  SOCIAL HISTORY:  The patient smoked in the past but is no longer a smoker.  There is no history of alcohol or illicit drug abuse.  PHYSICAL EXAMINATION:  VITAL SIGNS:  Blood pressure 159/109, heart rate 78, respiratory rate 16, temperature 98.2, O2 sat 98% on room air. MENTAL STATUS TESTING:  The patient is alert and awake.  She can follow commands without difficulty.  Speech is stuttering but fluent.  On cranial nerve testing, visual fields are grossly intact.  II, III, IV and VI extraocular movements intact.  V and VII smile symmetric.  VIII grossly intact.  IX and VII positive gag.  XI bilateral shoulder shrug and XII midline tongue extension.  On motor exam, the patient is 5/5 on the left.  She is  very difficult to examine on the right.  She reports to be unable to lift either the right upper or lower extremity, yet when I attempt to lift either she is forcibly pushing the extremity down to the bed.  When I finally was able to break her strength and did ask her to extend the leg or the arm she was able to do so without difficulty. She will not resist any pressure on either extremities.  On sensory testing, the patient reports decreased pinprick and light touch on the right that is at the midline.  Deep tendon reflexes are 2+ throughout. Plantars are mute bilaterally.  On cerebellar testing, finger-to-nose and heel-to-shin is intact on the left.  The patient will not attempt on the right.  LABORATORY DATA:  Sodium 138, potassium 3.9, chloride 103, bicarb 24, BUN and creatinine 7 and 1.3 respectively.  Glucose 98.  Hemoglobin and hematocrit 12.4 and 36.1 respectively.  White blood cell count 7.4, platelet count 252.  PT/INR and PTT 11.8, 0.85 and 23 respectively.  CT shows artifact in the patient's aneurysmal clipping but no acute changes.  ASSESSMENT:  Robin Jensen is a 50 year old female that presents with initial complaints difficulty with speech and  right-sided weakness and numbness.  Symptoms have improved since their initial onset.  There is stuttering speech on exam at this point that seems functional in nature. She is outside of the time window for t-PA and therefore will not receive t-PA today.  Further workup is indicated to determine if she has truly had a stroke.  Cannot rule the possibility if this may have been a transient ischemic event.  PLAN: 1. The patient had MRI of the brain without contrast. 2. BP control. 3. We will start 81 mg aspirin daily. 4. Echo and carotid has been performed recently.  The patient was seen     at [facility] and worked up for chest pain.  The patient's presentation suggest that she is critical and that there is high probability of  further neurologic decompensation.  The patient requires high complexity decision making.  Approximately 60 minutes was spent in face-to-face care and management of the patient.          ______________________________ Thana Farr, MD     LR/MEDQ  D:  07/21/2010  T:  07/22/2010  Job:  [id number]  Electronically Signed by Thana Farr MD on 07/26/2010 06:21:54 PM

## 2010-07-27 LAB — BENZODIAZEPINE, QUANTITATIVE, URINE
Alprazolam (GC/LC/MS), ur confirm: NEGATIVE NG/ML
Lorazepam UR QT: NEGATIVE NG/ML
Nordiazepam GC/MS Conf: NEGATIVE NG/ML

## 2010-07-28 NOTE — H&P (Signed)
NAME:  Robin Jensen, Robin Jensen NO.:  [id number]  MEDICAL RECORD NO.:  [id number]           PATIENT TYPE:  I  LOCATION:  3015                         FACILITY:  [facility]  PHYSICIAN:  Rosanna Randy, MDDATE OF BIRTH:  04/03/1961  DATE OF ADMISSION:  07/21/2010 DATE OF DISCHARGE:                             HISTORY & PHYSICAL   PRIMARY CARE PHYSICIAN:  Dr. Paulita Cradle.  CHIEF COMPLAINT:  Right-sided weakness and slurred speech.  The patient is going to be followed by triad hospitalist team 4.  HISTORY OF PRESENT ILLNESS:  The patient is a 50 year old female with extensive and significant past medical history for hypertension, depression and anxiety, history of migraines, bipolar disorder, nonepileptic seizure, hypothyroidism, and hyperlipidemia who came into the hospital complaining of right side weakness and also difficulty speaking.  The patient reports that this morning when she woke up she was having a right side headache.  She was now feeling okay or not really having any difficulty.  Then when she actually went into the kitchen to start preparing breakfast for her family, she notices that she had some numbness sensation on the right side and then according to the patient, she becomes blank.  The patient's daughter-in-law who was at that moment at the house noticing that the patient was having right- sided facial droop and was also having slurred speech.  At that time, EMS was called.  Due to the patient's risk factors and the history, she was transferred to the emergency department.  The total onset of symptoms and transferring times into the emergency department was more than 5 hours.  So even the patient was initially call a code stroke, she was already out of the therapeutic window for tPA.  Nonetheless, Neurology examined on the patient.  A CT and also an MRI of the head were done, both been negative for any acute findings with mild improvement of the  patient's symptoms after about 2 hours in the emergency department.  Concerns for TIA, even that they are really low according to stroke team.  Made triad hospitalist to being contacted for admission and further evaluation/workup.  While talking with the patient, while she was admitted into the hospital, she reported that she had been having a lot of stress at home, but denies missing any of her medications.  The patient was not complaining of any headache.  She was not having chest pain.  She was not having any visual disturbance or any shortness of breath during my interview and evaluation.  The patient reports having some nausea, but denies any vomiting.  The patient also denies having any fever.  ALLERGIES:  THE PATIENT IS ALLERGIC TO PENICILLIN.  SHE IS ALLERGIC TO SULFA AND ALSO ALLERGIC TO LATEX.  RASH, SWELLING, AND ITCHING ARE THE ALLERGY REACTIONS.  PAST MEDICAL HISTORY:  Significant for hypertension, gastroesophageal reflux disease, depression, anxiety, hyperlipidemia, hypothyroidism, nonepileptic seizure disorder, allergic rhinitis, insomnia, obesity, history of cerebral aneurysm repair in 1997, migraines, noncardiac chest pain with a negative Lexiscan Myoview stress test on July 15, 2010.  MEDICATIONS: 1. Trihexyphenidyl 2 mg 1 tablet by mouth twice a day. 2. Gabapentin  300 mg one to two capsule, one in the morning, one in     noon, and 2 capsules in the evening. 3. Zyrtec 10 mg 1 tablet by mouth daily. 4. Zolpidem 5 mg 1 tablet by mouth at bedtime as needed to fall     asleep. 5. Verapamil 300 mg 1 tablet by mouth daily. 6. Valium 5 mg 1 tablet by mouth three times a day for anxiety. 7. Tylenol Extra Strength 500 mg 1 tablet by mouth every 8 hours as     needed for pain. 8. Sumatriptan injection subcutaneously 1 shot daily as needed for     headache. 9. Spironolactone 100 mg 1 tablet by mouth daily. 10.MiraLax 17 grams daily as needed for constipation. 11.Phenytoin  100 mg 2 capsules by mouth twice a day. 12.Pantoprazole 40 mg 1 tablet by mouth twice a day. 13.Levothyroxine 75 mcg 1 tablet by mouth daily. 14.Labetalol 200 mg 1 tablet by mouth daily. 15.Haldol 5 mg 1 tablet by mouth daily at bedtime. 16.Lasix 40 mg 1 tablet by mouth daily. 17.Fenofibrate 145 mg 1 tablet by mouth daily. 18.Celexa 40 mg 1 tablet by mouth daily at bedtime. 19.Vitamin D 1000 units by mouth 1 tablet by mouth daily. 20.Lipitor 120 mg 1 tablet by mouth daily. 21.Aspirin 81 mg 1 tablet by mouth daily. 22.Albuterol inhaler 1 puff daily as needed for shortness of breath.  SOCIAL HISTORY:  The patient lives in [place] with her fiancee.  She is denying any smoking or alcohol.  The patient had a history of cocaine abuse.  She is having pain for the last 18 months.  The patient reports been using marijuana intermittently.  FAMILY HISTORY:  Noncontributory.  REVIEW OF SYSTEMS:  Negative except as mentioned on HPI.  PHYSICAL EXAM:  VITAL SIGNS:  Demonstrated a temperature of 98.2, blood pressure 178/104, heart rate 82, respiratory rate 26 on labor with oxygen saturation of 99% on room air. GENERAL:  The patient was lying in bed, well-developed, well-nourished, well-hydrated, not in any acute distress. HEENT:  Head and face, normocephalic and atraumatic, symmetrical on exam.  Eyes, normal in appearance.  Extraocular movements intact. PERRLA.  No scleral icterus.  Ear, nose, mouth, and throat were normal on exam.  Mouth and pharynx, normal moist, no erythema or exudate.  Fair dentition. NECK:  Supple.  No bruits. CARDIOVASCULAR:  Regular rate and rhythm. RESPIRATORY SYSTEM:  Clear to auscultation bilaterally. ABDOMEN:  Nontender and nondistended.  Positive bowel sounds. EXTREMITIES:  No edema. NEUROLOGIC:  The patient was alert, awake, and oriented x3.  Her cranial nerves II through XII were grossly intact on my evaluation.  The patient have a muscle strength 5/5 on the  left side of her body with 2/5 on her right side.  The patient refused to elevate her right leg.  According to her, she was unable to move it.  At the moment, I actually leave her right leg.  The patient was pushing against me.  The patient have a decreased right proprioception and also decreased light touch especially on her right foot.  Rest of her neurologic exam was normal.  LABORATORY DATA:  Pertinent on this admission, the patient has bicarb of 24, ionized calcium of 114, hemoglobin 13.3, sodium 138, potassium 3.9, chloride 103, glucose 98, BUN 7, creatinine 1.0.  The patient's hematocrit was 39.  The patient also had a white blood cells demonstrated now 7.4, hemoglobin 12.4, hematocrit 36.1, platelet 252, absolute neutrophils 4.7.  PT 11.8, INR 0.85, PTT  23.  The patient had a formal BMET that actually show a sodium of 136, potassium 3.8, chloride 103, bicarb 25, blood sugar 99, BUN 7, creatinine 0.96, bilirubin 0.3, alkaline phosphatase 58, AST 49, ALT 44.  The patient have an elevated CK total of 397, normal troponin, CK-MB 2.0, relative index 0.5.  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 Taking this limitation into account, there was no evidence of intracranial hemorrhage or CT evidence of large acute infarct.  The patient also had an MRI of the brain.  Again artifact caused by the aneurysm clip in the aneurysm clip in the  right cavernous sinus region taking limitation into account, there was no acute infarct or intracranial hemorrhage noted.  There was a slight increase in number of minimal punctate nonspecific white matter type changes as noted above, paranasal sinus mucosal thickening was also seen.  ASSESSMENT AND PLAN: 1. Right-sided weakness and difficulty speaking, most likely with     these findings of negative CT and negative MRI due to bipolar     disorder and/or  complicated migraines, due to patient's risk factor     less likely diagnosis might be a transient ischemic attack.  Plan     is to admit the patient.  Continue her on aspirin.  Order PT/OT and     will also go ahead and have 2-D echo and carotid Doppler done.     Stroke Team will be following along. 2. Hypertension, which was uncontrolled after not been taking her     medication throughout the whole day.  We are going to restart her     blood pressure medications and follow response, then we will     readjust these hypertensive drugs as needed. 3. Gastroesophageal reflux disease.  Plan is to continue with PPI. 4. Depression/anxiety.  We are going to continue her home medications. 5. Hyperlipidemia.  We are going to check a lipid profile and continue     statins. 6. Hypothyroidism.  We are going to check a TSH and we are going to     continue the patient's Synthroid. 7. Nonepileptic seizure.  We are going to check a Dilantin level and     we are going to continue the patient on her phenytoin. 8. Allergic rhinitis.  We are going to continue Zyrtec. 9. Insomnia.  We are going to continue p.r.n. zolpidem.  The rest of     the patient's problems and treatments will be adjusted on daily     basis depending on the abnormalities and findings of the test that     has been ordered.     Rosanna Randy, MD     CEM/MEDQ  D:  07/21/2010  T:  07/21/2010  Job:  [id number]  cc:   Dr. Paulita Cradle  Electronically Signed by Vassie Loll MD on 07/28/2010 04:02:02 PM

## 2010-07-28 NOTE — Discharge Summary (Signed)
NAME:  Robin Jensen, ADDUCI NO.:  [id number]  MEDICAL RECORD NO.:  [id number]           PATIENT TYPE:  I  LOCATION:  3015                         FACILITY:  [facility]  PHYSICIAN:  Rosanna Randy, MDDATE OF BIRTH:  1961-01-02  DATE OF ADMISSION:  07/21/2010 DATE OF DISCHARGE:  07/22/2010                              DISCHARGE SUMMARY   PRIMARY CARE PHYSICIAN:  Dr. Paulita Cradle.  DISCHARGE DIAGNOSES: 1. Right-sided weakness and a slurred speech, questionable transient     attack versus complicated migraine. 2. Hypertension. 3. Gastroesophageal reflux disease. 4. Hyperlipidemia. 5. Hypothyroidism. 6. Bipolar disorder. 7. Allergic rhinitis. 8. Seizure disorder. 9. Depression/anxiety. 10.Migraine. 11.Chest pain, noncardiac. 12.History of cerebral aneurysm repair in 1997. 13.Insomnia.  DISCHARGE MEDICATIONS: 1. Albuterol 1 puff by mouth daily as needed for shortness of breath. 2. Lipitor 20 mg 1 tablet by mouth daily. 3. Aspirin 81 mg 1 tablet by mouth daily. 4. Vitamin D3, 1000 units 1 tablet by mouth daily. 5. Celexa 40 mg 1 tablet by mouth daily at bed time. 6. Fenofibrate 145 mg 1 tablet by mouth daily. 7. Furosemide 40 mg 1 tablet by mouth daily. 8. Gabapentin 300 mg 1 tablet by mouth in the morning, one at noon and     2 capsules in the evening. 9. Haldol 5 mg 1 tablet by mouth daily at bedtime. 10.Labetalol 200 mg 1 tablet by mouth twice a day. 11.Levothyroxine 75 mcg 1 tablet by mouth daily. 12.Phenytoin 100 mg 2 capsules by mouth twice a day. 13.MiraLax 17 g by mouth daily as needed for constipation. 14.Protonix 40 mg 1 tablet by mouth twice a day. 15.Spironolactone 100 mg 1 tablet by mouth daily. 16.Sumatriptan injection one subcutaneously daily as needed for     headaches. 17.Trihexyphenidyl 2 mg 1 tablet by mouth twice a day. 18.Tylenol Extra Strength 500 mg tablets 1 tablet by mouth every day 8     hours as needed for headache. 19.Valium  5 mg 1 tablet by mouth 3 times a day. 20.Verapamil 300 mg extended release 1 tablet by mouth daily. 21.Zolpidem 5 mg 1 tablet by mouth daily at bedtime. 22.Zyrtec 1 tablet by mouth daily.  DISPOSITION AND FOLLOWUP:  The patient is going to be discharged in stable and improved condition.  Currently, not having any difficulty speaking and also not complaining of any weakness on her right side. The patient was evaluated by Neurologic Stroke Team during this hospitalization, who in fact rule out that the patient have any stroke. The patient is going to follow with primary care physician to continue controlling her chronic problems and she had been instructed to follow a heart healthy diet to be compliant with her statins and to take an aspirin every day.  The patient is going also to be followed by the psychiatrist group in [place].  Currently, they are totally broke up, but they are planning to contact the patient on August 01, 2010, in order to schedule an appointment at that time, in order to go over patient's psychiatric illness and to adjust her medications.  The patient had received at least of the agencies that provide psychological counseling  at [facility] in order to arrange followup and to follow with them in order to help with the patient's current living stress and to assist as much as possible with everything that they can help her with.  PROCEDURE PERFORMED DURING THIS ADMISSION:  The patient had CT of the head without contrast that demonstrated no evidence of intracranial hemorrhage or evidence of large acute infarct on the CT.  The patient had also an MRI of the brain that demonstrated no acute infarct or intracranial hemorrhage.  There was a slight increase in the number of minimal punctate nonspecific white matter type changes as noted above and there was also paranasal sinus mucosal thickening.  For full reports please refer to radiology dictation of MRI of the brain and  CT of the head done on July 21, 2010.  Both studies were limited because of artifact caused by the aneurysm clip.  The patient also had carotid Dopplers performed on July 22, 2010, with no ICA stenosis and vertebral arteries with antegrade flow.  No other studies were performed during this admission.  Neurology specifically Stroke Team was consulted.  HISTORY OF PRESENT ILLNESS:  Robin Jensen is a 50 year old female with significant past medical history for hypertension, depression and anxiety, history of migraines, bipolar disorder, nonepileptic seizure, hypothyroidism, hyperlipidemia, who came into the hospital complaining of a right-sided weakness and also difficulty speaking.  The patient reports that the morning when she woke up she was having a right-sided headache and eventually failed numbness on her right side and was found by one of her family members to have a slurred speech.  The patient was transferred into the hospital for further evaluation.  A Code Stroke was called, but the patient was brought in 5 hours away from initial symptoms presentation, so she was out of her therapeutic window for TPA, at that moment, CT of the head, MRI of the head were also done and the patient was admitted for further evaluation and treatment.  On admission, the patient's vital signs demonstrated a temperature of 98.2, blood pressure 178/104, heart rate 82, respiratory rate 26, nonlabored with an oxygen saturation 99% on room air.  PERTINENT LABORATORY DATA:  Demonstrated a TSH of 2.015.  Phosphorus 3.1, magnesium 1.9, a Dilantin level of 5.4.  CMP with a sodium of 137, potassium 3.6, chloride 105, bicarb 26, blood sugar 139, BUN 4, creatinine 0.93.  Normal liver function tests.  The patient also had a lipid profile with a total cholesterol 222, triglycerides 388, HDL 37, LDL 107.  The rest of the patient's blood work done on admission were unremarkable.  HOSPITAL COURSE BY PROBLEM: 1.  The patient's questionable TIA versus complicated migraine.  Since     she had the history of migraines and woke up having headaches prior     to developing her symptoms of right-sided weakness and slurred     speech, unfortunately we are medically unable to differentiate     between 2 diagnosis and because the patient had the risk factors     for stroke, specifically hypertension and hyperlipidemia.  The plan     after reviewing the CT, MRI and carotid Dopplers is to start the     patient on aspirin 81 mg by mouth daily to have a good control of     the patient's blood pressure and also to continue statins.  The     patient is going to follow with primary care physician in order to  continue adjusting her medications for better control of this     chronic conditions. 2. The patient's hypertension.  At this point, we are going to give a     prescription of labetalol and verapamil, 2 medications that she ran     out prior coming to the hospital and because she has not seen her     primary care physician yet she had not been able to get that.  The     patient has just relocated from the [facility] and is planning     to return to a primary care physician that she had seen in the past     Dr. Bevelyn Buckles.  She is going to be following a low-sodium diet and     is going to continue taking her medications for blood pressure. 3. Gastroesophageal reflux disease.  Plan is to continue PPI. 4. Hyperlipidemia, plan is to continue statins. 5. Hypothyroidism.  Plan is to continue Synthroid. 6. The patient's bipolar disorder.  No changes have been made to her     psychiatric medications.  The patient is going to be followed up by     a psychiatrist in [place], meanwhile is going to continue taking     her medication as prescribed. 7. The patient's allergic rhinitis, we are going to continue Zyrtec. 8. The patient's nonepileptic seizure disorder.  Plan is to continue     Dilantin, even though she  is subtherapeutic on her Dilantin level     which was checked during this admission and demonstrated to be 5.4,     the decision by the neurologist is not to loading her or increased     the doses of her Dilantin at this point.  The patient is currently     not having any seizure activity. 9. The patient's depression and anxiety,  the plan is to continue home     medications. 10.Migraines.  At this point, plan is to continue using p.r.n. Tylenol     and also sumatriptan. 11.The patient noncardiac chest pain with a recent Myoview Lexiscan     and a stress test done on July 15, 2010, that demonstrated no     abnormalities on her heart, the plan is for the patient to continue     using her PPI. 12.History of cerebral aneurysm repair in 1997, is stable, no changes     seen on CT and MRI.  No further workup or treatment needed at this     point for this problem.  At discharge, the patient's vital signs, temperature 98.1, heart rate 78, respiratory rate 20, blood pressure 130/85, oxygen saturation 98% on room air.  At discharge, the patient had a CBC showing white blood cell of 5.4, hemoglobin 12.0, platelet 233,000.  Also, had a BMET with a sodium of 142, potassium 3.6, chloride 112, bicarb 23, BUN 4, creatinine 0.81, and blood sugar 108.     Rosanna Randy, MD     CEM/MEDQ  D:  07/22/2010  T:  07/23/2010  Job:  [id number]  cc:   Dr. Paulita Cradle  Electronically Signed by Vassie Loll MD on 07/28/2010 04:02:25 PM
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
## 2010-08-16 NOTE — Consult Note (Signed)
NAME:  Robin Jensen, Robin Jensen NO.:  [id number]  MEDICAL RECORD NO.:  [id number]           PATIENT TYPE:  O  LOCATION:  A316                          FACILITY:  [facility]  PHYSICIAN:  Bettey Mare. Lawrence, NPDATE OF BIRTH:  15-Aug-1960  DATE OF CONSULTATION:  07/15/2010 DATE OF DISCHARGE:                                CONSULTATION   PRIMARY CARDIOLOGIST:  Dr. Simona Huh which will be new.  PRIMARY CARE PHYSICIAN:  She does not have one.  REASON FOR CONSULTATION:  Chest pain.  HISTORY OF PRESENT ILLNESS:  This is a 50 year old obese Caucasian female with multiple admissions for chest pain at various hospitals. She has been admitted also for psychiatric admissions for suicidal ideation and depression.  The patient was most recently seen in 2009 through [facility]; however, she has been seen at [facility] most recently when she lived in [place].  The patient states that she was sitting watching television when she had substernal chest pain described as severe pressure radiating to the left arm and shoulder lasting approximately 5 minutes going away on its own.  The pain returned 30 minutes later lasting 15 minutes same radiation with diaphoresis, mild shortness of breath, and lightheadedness.  The patient called EMS who arrived and brought her to ER.  On admission to ER, the patient was found to be hypertensive with a blood pressure of 165/110. The patient was given sublingual nitroglycerin.  She also was given Zofran, morphine, and Toradol with relief of pain.  The patient's EKG did not show any acute ST-T wave elevations.  She had normal sinus rhythm with poor R-wave progression noted.  The patient has been admitted to rule out myocardial infarction, and we are asked to make further recommendations.  REVIEW OF SYSTEMS:  Positive for chest pain, shortness of breath, pain radiating to the left shoulder and down the left arm with  associated diaphoresis.  All other systems have been reviewed and are found to be negative.  Code status is full.  PAST MEDICAL HISTORY:  Obesity, recurrent chest pain with multiple admissions for same at various hospitals, nonepileptic seizures bipolar disorder, suicide attempt, psychosis, prior cocaine use, hypertension, hyperlipidemia, and hypothyroidism.  PAST SURGICAL HISTORY:  Cholecystectomy and right brain aneurysm.  SOCIAL HISTORY:  She lives in [place] times 1 month with her son. She is not employed.  She is divorced.  She quit smoking 24 years ago. She had been heavy alcohol and cocaine user and has been sober for 5 years.  FAMILY HISTORY:  Mother with hypertension.  Father deceased from an MI at age 81 with EtOH abuse.  Siblings are in good health.  CURRENT MEDICATIONS:  Albuterol 108 mcg p.r.n., atorvastatin 20 mg daily, bupropion 150 mg daily, vitamin D 1000 units daily, citalopram 40 mg nightly, fenofibrate 145 mcg daily, furosemide 40 mg day, labetalol 20 mg b.i.d., levothyroxine 75 mcg daily, oxcarbazepine 150 mg t.i.d., phenytoin, polyethylene 3350 mg p.r.n., risperidone 1 mg at bedtime, spirolactone 100 mg daily, sumatriptan p.r.n., and verapamil 300 mg daily.  ALLERGIES:  SULFA, PENICILLIN, and LASIX.  LABORATORY DATA:  Sodium 136, potassium 3.5, chloride 101,  CO2 25, BUN 9, creatinine 1.0, glucose 12.8, hematocrit 37.1, white blood cells 8.5, polys 105, troponin 0.02, 0.02 respectively.  EKG normal sinus rhythm rate of 84 beats per minute with poor R-wave progression.  Chest x-ray revealing no active disease.  PHYSICAL EXAMINATION:  VITAL SIGNS:  Blood pressure 132/85, pulse 77, respirations 18, temperature 97.8, and O2 sat 99% on 2 liters. GENERAL:  She is awake, alert, oriented, kind of listless. HEENT:  Head is normocephalic and atraumatic.  Eyes:  PERRLA. NECK:  Supple without thyromegaly.  No JVD or no carotid bruit.  The neck is  obese. CARDIOVASCULAR:  Regular rate and rhythm without murmurs, rubs, or gallops.  Pulses are 2+ and equal without bruits. LUNGS:  Clear to auscultation. ABDOMEN:  Obese, nontender with 2+ bowel sounds. EXTREMITIES:  Without clubbing, cyanosis, or edema. MUSCULOSKELETAL:  No joint deformity or effusions. NEURO:  Cranial nerves II through XII are grossly intact.  IMPRESSION: 1. Chest pain, typical features of cardiac etiology but no acute ST-T     wave changes.  Negative cardiac enzymes, pain is relieved with     morphine, Vicodin, and nitroglycerin.  Lexiscan scan Myoview if     negative could discharge home.  She has multiple cardiovascular     risk factors to include thyroid disease, hypertension, and     hypercholesterolemia along with family history. 2. Hypertension controlled on current medications. 3. Hyperlipidemia.  We will check lipids for evaluation.  PLAN:  This is a 50 year old obese Caucasian female.  We are asked to see secondary to recurrent chest pain typical for cardiac etiology concerning her symptoms expression; however, objective data does not show any acute changes or ischemic concerns, the patient is complaining of generalized weakness and fatigue.  Therefore, we will check a Lexiscan Myoview versus a walking stress test for continued assessment and evaluation.  If positive, we will plan to transfer to [facility] for cardiac catheterization.  If negative, the patient would be discharged home.  We will make further recommendations based on her test results.  On behalf the physicians and providers of [facility], we would like to thank [facility] for allowing [name] to participate in the care of this patient.     Bettey Mare. Lyman Bishop, NP     KML/MEDQ  D:  07/15/2010  T:  07/15/2010  Job:  [id number]  cc:   [facility]  Electronically Signed by Joni Reining NP on 07/25/2010 08:00:45 AM Electronically Signed by Nona Dell  MD on 08/16/2010 04:11:11 PM

## 2010-09-06 NOTE — Discharge Summary (Signed)
NAMELARINE, FIELDING NO.:  [id number]   MEDICAL RECORD NO.:  [id number]          PATIENT TYPE:  INP   LOCATION:  2901                         FACILITY:  [facility]   PHYSICIAN:  Vesta Mixer, M.D. DATE OF BIRTH:  05/20/1960   DATE OF ADMISSION:  01/15/2008  DATE OF DISCHARGE:  01/16/2008                               DISCHARGE SUMMARY   DISCHARGE DIAGNOSES:  1. Noncardiac chest pain.  2. Hypertension.  3. Anxiety.  4. Hypothyroidism.  5. Depression.   DISCHARGE MEDICATIONS:  1. Topamax 50 mg a day.  2. Cymbalta 60 mg a day.  3. Trazodone 150 mg a day.  4. Valium 10 mg three times a day as needed.  5. Synthroid 75 mcg a day.  6. Exforge 5/320 once a day.  7. Clonidine 0.1 mg twice a day.  8. Neurontin 300 mg three times a day.  9. Fioricet as needed.  10.We will add Prevacid 30 mg a day.   DISPOSITION:  The patient will see Dr. Paulita Cradle as needed.  She  likely needs a GI evaluation.  She will see Dr. Elease Hashimoto in 1-2 weeks as  needed.  We have asked her to call for an appointment.   HISTORY:  Generally, she is a 50 year old female who was transferred  from [facility] for episodes of chest pain and some EKG  changes.  Please see dictated H and P for further details.   HOSPITAL COURSE PER PROBLEMS:  1. Chest pain.  The patient had negative cardiac enzymes despite      having 12-14 hours of chest pain when she arrived.  She continued      to have negative enzymes.  Her pain was not relieved with      nitroglycerin.  Her pain was completely relieved with GI cocktail.      The patient did not have any further problems.  She does have T-      wave inversions in the anterolateral leads and it is quite possible      that these were just due to her hypertension.  We do not have an      old EKG for comparison.   The patient is stable from a cardiac standpoint.  We will see her in the  office as needed and will likely do a stress test.  We will  have her  follow up with Dr. Bevelyn Buckles.  We will start her on Prevacid 30 mg a day  for what appears to be GI-type chest pain.  All of her other medical  problems were stable.      Vesta Mixer, M.D.  Electronically Signed     PJN/MEDQ  D:  01/16/2008  T:  01/16/2008  Job:  [id number]   cc:   Paulita Cradle

## 2010-09-06 NOTE — Discharge Summary (Signed)
NAMEMarland Kitchen  Jensen, Robin Jensen NO.:  [id number]   MEDICAL RECORD NO.:  [id number]          PATIENT TYPE:  INP   LOCATION:  3305                         FACILITY:  [facility]   PHYSICIAN:  Theodosia Paling, MD    DATE OF BIRTH:  Jan 30, 1961   DATE OF ADMISSION:  02/17/2008  DATE OF DISCHARGE:                               DISCHARGE SUMMARY   ADDENDUM   HOSPITAL COURSE:  1. Altered mentation, most likely secondary to psychotropic drugs.      Higher dose of Seroquel was held, sedatives were avoided and      patient's mentation got back to her normal baseline.  Patient is      alert, oriented x3, and functioning fine at this time.  2. Hallucinations.  Patient was complaining of auditory and visual      hallucinations.  This can be addressed at [facility] by on-      call psychiatrist there.  3. UTI.  Patient was found to have urine microscopy suggestive of UTI      which she has no leukocytosis, hypertension, fever, or chills.      Therefore, patient is getting discharged on ciprofloxacin 500 mg      p.o. every 12 hours to complete 7-day course of antibiotics.   Thank you so much for this dictation.      Theodosia Paling, MD  Electronically Signed     NP/MEDQ  D:  02/17/2008  T:  02/17/2008  Job:  [id number]   cc:   Paulita Cradle, M.D.
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
## 2010-09-06 NOTE — Group Therapy Note (Signed)
NAME:  Robin Jensen, COLEBANK NO.:  [id number]   MEDICAL RECORD NO.:  [id number]          PATIENT TYPE:  OBV   LOCATION:  A308                          FACILITY:  [facility]   PHYSICIAN:  Dorris Singh, DO    DATE OF BIRTH:  November 25, 1960   DATE OF PROCEDURE:  03/07/2008  DATE OF DISCHARGE:                                 PROGRESS NOTE   HISTORY OF PRESENT ILLNESS:  The patient seen today.  States she feels  better.  She still is concerned about her safety was suicidal ideation.  ACT team has seen her and awaiting possible transfer.  T   PHYSICAL EXAMINATION:  VITAL SIGNS:  Temperature 98.6, pulse 70,  respirations 20, blood pressure 160/106.  GENERAL:  The patient is well-developed, well-nourished.  HEART:  Regular rhythm.  LUNGS:  Clear to auscultation bilaterally.  ABDOMEN:  Soft, nontender.  EXTREMITIES:  Positive pulses.   LABORATORY DATA:  Today, white count 6.8, hemoglobin 12.3, hematocrit  35.4 plate count 161, INR is normal.  Sodium 141, potassium 3.9,  chloride 108, CO2 26, glucose 102, BUN 9 and creatinine 0.75.  Her  cardiac enzymes are negative.   ASSESSMENT/PLAN:  1. Drug overdose with Valium and Seroquel.  2. Elevated blood pressure.  3. Major depression.   Will go ahead and continue current therapy.  Will review patient's blood  pressure medications and if not add amlodipine due to her elevated  diastolic pressure.  Will continue to monitor and await placement.  The  ACT team has seen her and we placed the patient in Behavioral Health.      Dorris Singh, DO  Electronically Signed     CB/MEDQ  D:  03/07/2008  T:  03/07/2008  Job:  [id number]

## 2010-09-06 NOTE — Discharge Summary (Signed)
NAMEMarland Jensen  Robin, Jensen NO.:  [id number]   MEDICAL RECORD NO.:  [id number]          PATIENT TYPE:  INP   LOCATION:  3305                         FACILITY:  [facility]   PHYSICIAN:  Theodosia Paling, MD    DATE OF BIRTH:  [birth year]   DATE OF ADMISSION:  02/17/2008  DATE OF DISCHARGE:  02/17/2008                               DISCHARGE SUMMARY   PRIMARY CARE PHYSICIAN:  Dr. Paulita Cradle.   ADMITTING HISTORY:  Please refer to the admission note dictated by Dr.  Della Goo on February 16, 2008.   DISCHARGE DIAGNOSES:  1. Altered mentation.  2. Hallucination and psychotic disorder.  3. Urinary tract infection.   DISCHARGE MEDICATIONS:  Consist of:  1. Levothyroxine 75 mcg p.o. daily.  2. Clonidine 1 mg p.o. every 12 hours, hold if systolic blood pressure      less than 120.  3. Norvasc 10 mg p.o. daily, hold for systolic blood pressure less      than 120.  4. Benazepril 10 mg p.o. daily, hold if SBP less than 100.  5. Topamax 50 mg p.o. daily.  6. Neurontin 300 mg p.o. every 8 hours.  7. Dilantin 100 mg p.o. every 12 hours.  8. Thiamine 100 mg p.o. daily.  9. Multivitamin 1 tab p.o. daily.  10.Trazodone 150 mg p.o. q.h.s. p.r.n.  11.Cymbalta 30 mg p.o. daily until February 20, 2008, then discontinue.  12.Risperdal 1 mg p.o. every 6 hours as needed for agitation.  13.Fluoxetine 20 mg p.o. daily.   MEDICATION MODIFICATION:  Recommended is for:  1. Valium 10 mg p.o. every 8 hours.  2. Seroquel 900 mg p.o. q.h.s.  3. Recommend to modify psychotropic medications as patient's altered      mentation is presumed to be secondary to above.   NEW MEDICATION ADDED:  Ciprofloxacin 500 mg p.o. every 12 hours.   IMAGING PERFORMED:  CT of the head without contrast showing no acute  intracranial event.   CONSULTATION OBTAINED:  None.   PROCEDURE PERFORMED:  None.   DISPOSITION:  Patient is going back to [facility] for management  of her psychotic disorders.   Her medications for hallucination and  psychotic disorder need to be readjusted according to the on-call  psychiatrist there.   Thank you so much for this dictation.   TOTAL TIME SPENT ON DISCHARGE OF THIS PATIENT:  35 minutes.      Theodosia Paling, MD  Electronically Signed     NP/MEDQ  D:  02/17/2008  T:  02/17/2008  Job:  [id number]   cc:   Paulita Cradle, M.D.
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
## 2010-09-06 NOTE — Discharge Summary (Signed)
NAME:  Robin Jensen, Robin Jensen            ACCOUNT NO.:  [id number]   MEDICAL RECORD NO.:  [id number]          PATIENT TYPE:  INP   LOCATION:  3710                         FACILITY:  [facility]   PHYSICIAN:  Pramod P. Pearlean Brownie, MD    DATE OF BIRTH:  [birth year]   DATE OF ADMISSION:  12/19/2007  DATE OF DISCHARGE:  12/22/2007                               DISCHARGE SUMMARY   ADMISSION DIAGNOSIS:  Stroke.   DISCHARGE DIAGNOSES:  1. Complicated migraine episode.  2. 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  3. Remote history of left Bell's palsy.  4. History of migraine headaches.   HOSPITAL COURSE:  Robin Jensen is a 50 year old Caucasian lady who was  admitted with symptoms of 3 days of severe headache, blurred vision in  the right eye, and some left facial droop.  Robin Jensen also had some right-  sided weakness, which was transient.  Robin Jensen is a poor historian and exact  course of Robin Jensen symptoms progression was unclear.  Robin Jensen was admitted for  workup of possible TIA.  Admission CT scan showed no acute abnormality.  It showed aneurysm clip in the right carotid area.  An MRI scan of the  brain was subsequently obtained, which did not show an acute stroke but  showed significant susceptibility artifact in the right terminal carotid  region, which  masked the appearance of the distal right internal  carotid artery and proximal middle cerebral artery on the MRA films.  CT  angiogram was then subsequently done to clarify this, which showed  persistent flow in the terminal ICA and proximal middle cerebral artery  with an aneurysm clip, which was then placed without any regrowth of  aneurysm in that area.  The patient's headache was initially treated  with IV Depacon without much relief.  Subsequently, Robin Jensen was treated with  Toradol, which seemed to help better.  Robin Jensen was started on Depakote ER  for headache prophylaxis.  The patient on the day of discharge felt much  better.   Robin Jensen was headache-free but Robin Jensen did inform me that Robin Jensen had tried  Depakote in the past for headache prophylaxis without much relief, and  this was changed to Topamax.  Robin Jensen was being followed by a neurologist,  Dr. Gerilyn Pilgrim for Robin Jensen migraines in [place],  hence Robin Jensen was advised to  follow up with him for further instructions.  Rest of home medications  were unchanged.  At the time of discharge, Robin Jensen was stable and symptom  free.   MEDICATIONS:  1. Fioricet 1 or 2 tablets as needed for headaches.  2. Topamax 50 mg a day.  3. Cymbalta 60 mg a day.  4. Dilantin 400 mg a day.  5. Trazodone 150 at bedtime.  6. Valium 10 mg 2 times a day.  7. Levothyroxine 50 mcg daily.  8. Exforge 350 mg daily.  9. Clonidine 0.1 mg twice a day.  10.Neurontin 300 three time a day.   Rest of the lab work done in the hospital stay included normal metabolic  panel.  Hemoglobin A1c was normal at 5.3.  Lipid profile showed  cholesterol 206, triglycerides 186, and HDL of 35, and LDL of 134.  Homocysteine was normal at 5.7.  UA showed no evidence of infection.   The patient was discharged home in a stable condition and advised to  follow up with Robin Jensen primary physician, Paulita Cradle in [place],  in  a few weeks and with Robin Jensen neurologist, Dr. Gerilyn Pilgrim in a month.           ______________________________  Sunny Schlein. Pearlean Brownie, MD     PPS/MEDQ  D:  12/22/2007  T:  12/23/2007  Job:  [id number]
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
## 2010-09-06 NOTE — Consult Note (Signed)
NAME:  Robin Jensen, Robin Jensen NO.:  [id number]   MEDICAL RECORD NO.:  [id number]          PATIENT TYPE:  OBV   LOCATION:  3710                         FACILITY:  [facility]   PHYSICIAN:  Melvyn Novas, M.D.  DATE OF BIRTH:  [birth year]   DATE OF CONSULTATION:  12/19/2007  DATE OF DISCHARGE:                                 CONSULTATION   Code Stroke evaluation in the [facility].   This is a 50 year old female patient, Caucasian, right-handed, who was  seen by Dr. Preston Fleeting here in the ER, and complains about vision deficits,  numbness, headache, and a variety of nonneurologic symptoms as well.  She noted headaches which she attributes to cause her insomnia, and she  has had trouble with bladder spasms and burning urination.  The patient is fully oriented.  Also, she does vary with the answers  during the stroke exam quite frequently.  The chief complaint of focal  weakness could not be confirmed on this exam.   The patient has the following past medical illness history.  She  presented with 3-4 days of severe headaches, has taken 4 Tylenol up to 6  times a day and after 3 days of not finding relief, got Percocet or  Vicodin through a friend, which helped her to sleep a couple of hours.  She states that she has not slept well, that she has not eaten well, and  that she felt her vision changing temporarily yesterday and today, awoke  with blurring.  While she was noted here to have complained initially about a visual  field cut, to me, she only reports right-sided visual blurring while her  left eye seems to work fine and she does have saccadic eye movements.  She denies any field cut, diplopia.  Now, she has no rigor.  No Kernig  or Brudzinski sign.  No fever.   PHYSICAL EXAMINATION:  GENERAL:  The patient is well-developed, well-  nourished, well-hydrated in no acute distress.  HEENT:  Presents normocephalic and atraumatic but with a poor dental  status.  Her lower teeth  are lost.  Her eyes show pupils that react  equal to light, but she has a slightly more pronounced left-sided  ptosis.  No scleral icterus.  Her extraocular movements are saccadic in  the horizontal plane.  Up and downward gaze are not impaired.  She  complains of no ear pain, does not respond to pressure on the tragus.  There is no nasal discharge.  NECK:  She does not have lymph node swelling or goiter.  Her neck is  supple.  There is no carotid bruit.  CARDIAC:  No cardiac murmur.  She has a regular rate and rhythm.  LUNGS:  Her respiratory shows bilaterally wheezing and mild rhonchi.  She does not cooperate fully with the exam, does not take a deep breath  on command, but her chest movements appear normal.  ABDOMEN:  She has tenderness in the suprapubic area and complains about  a distended bladder and states that she feels constantly as if she has  to urinate.  There is also a burning  and a spastic component to her  lower abdominal pain.  EXTREMITIES:  The patient provided full range of motion.  No edema.  She  has multiple tattoos on her extremities.  She appears poorly groomed.   SENSORY EXAMINATION:  The patient had, since her aneurysm bleeds in 1997  or 1996, had some trouble with on and off numbness in the contralateral  right body.  This by itself would not be unusual for her, she states.   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  She  states she is not allowed to have an MRI.  Stroke risk factors include  further hypertension, obesity.  She has gained significant weight on  psychotropic drugs.  She has a history of previous aneurysm and  therefore, is more prone to have other vascular malformations.  She has  complained about obstructive sleep apnea, and she has a sedentary  lifestyle.   MEDICATIONS:  1.  Cymbalta 60 mg once a day.  2. Dilantin 100 mg capsules 2 twice a day.  3. Trazodone 150 mg at bedtime.  4. Diazepam 10 mg 3 times a day.  5. Invega oral 6 mg every morning.  6. Levothyroxine 50 mcg once a day.  7. Exforge oral 250 mg once a day.  8. Clonidine 0.1 mg twice a day by mouth.  9. Gabapentin by mouth 300 mg 3 times a day.  10.Fioricet oral as needed.  11.The patient has recently started on VESIcare by her primary care      physician, Dr. Christell Constant at [facility] and Paulita Cradle, his nurse practitioner.   The patient is cooperating with the exam excellently, but her  information remains spotty.  Her review of systems was given, and she  repeated multiple times the same complaints during her review of systems  obtainment.   ALLERGIES:  She is allergic to PENICILLIN and SULFA.   FAMILY HISTORY:  She has a cancer history in her family but does not  state what kind of tumors, female cancer.   SOCIAL HISTORY:  The patient is widowed since 60.  She is a former substance abuser but is not using alcohol or drugs now.  She lives with her son and apparently with her son's wife and children.  Tobacco use is still endorsed.  She is not a drinker, and she denies any illegal drug use in the last 6  years.   Temperature 97 degrees Fahrenheit, blood pressure 180/100, heart rate is  71, respiratory rate is 19-20 and varies, O2 saturation is 95% on room  air.  The patient's height and weight were not measured but followed her  instructions, she is supposedly 5 feet 6 inches tall and has a weight of  170 pounds.   The patient underwent multiple tests including a CT scan of the brain,  which showed the aneurysm clip but no other abnormalities.  Certainly,  no evidence of a stroke at this time.  Her laboratory results for the  stroke panel came back.  Prothrombin time 12.5, INR 0.9, hemoglobin  12.4, hematocrit 36.8, MCV 87.6, INR 0.9, lymphocytes 51, monocytes  11,  eosinophil 1%, platelet count 171,000.  The patient's blood chemistry  has not been printed or has not returned yet.   CONCLUSION:  I suspect that the patient, in spite  of a low white blood  cell count, has a urinary tract infection.  Her severe headaches are not  associated with the bleed.  There is no meningeal sign, and I doubt that  she had a new stroke.  I would like to see if we can contact her  previous neurosurgeon's office and see if the aneurysm clip is truly MRI  incompatible.  At this time, I would like to discharge the patient, who  feels that she cannot go home because of:  1. A possible urinary tract infection.  2. Because her vision is still not back to normal, and she has      headache.   She certainly does not need to be on the Stroke Service but arrived at  the ER as a Code Stroke.  We will request the Hospitalist Service  working with the [facility] in [place] to admit the  patient and follow as consultants.  At this time, I just recommend  treatment for acute headaches.  I will give the patient 2 g of Depakote  IV rapid infusion.  She will get an extra Valium for complaint of  anxiety and spasms.  I have also asked for old medical records and for  the [facility] medical records if they still exist.  Again, the patient  was very helpful in telling me that Dr. Deatra Canter from [facility] operated on her in 1967 and that the surgery was done at  [facility], that her physician was a member of the  [facility].  She agreed to have further neurological  care through that office.      Melvyn Novas, M.D.  Electronically Signed     CD/MEDQ  D:  12/19/2007  T:  12/20/2007  Job:  [id number]

## 2010-09-06 NOTE — H&P (Signed)
NAME:  Robin Jensen, Robin Jensen NO.:  [id number]   MEDICAL RECORD NO.:  [id number]          PATIENT TYPE:  OBV   LOCATION:  A308                          FACILITY:  [facility]   PHYSICIAN:  Dorris Singh, DO    DATE OF BIRTH:  January 08, 1961   DATE OF ADMISSION:  03/06/2008  DATE OF DISCHARGE:  LH                              HISTORY & PHYSICAL   CHIEF COMPLAINT:  Suicide attempt.   PRIMARY CARE PHYSICIAN:  Dr. Rudi Jensen.   HISTORY OF PRESENTING ILLNESS:  She was brought in via Patent examiner.  Basically, the patient stated that she was just discharged from  [facility] in [place] where she has a psychiatrist.  She woke  up this morning and did not want to live, so she went ahead and took up  to 5 Seroquel and 6 Valium.  At this point in time, 911 was called.  She  states an extensive history of depression and several multiple  hospitalizations regarding depression as well, and she is seeking  inpatient treatment today.  There is some concern due to the Seroquel  and some changes on EKG and will monitor for up to 24 hours.   Her past medical history is significant for bipolar disorder, cerebral  aneurysm, GERD,  hyperlipidemia, hypertension, hypothyroidism, migraine  headaches, schizoaffective disorder and seizure disorder.   SURGICAL HISTORY:  She has had a partial hysterectomy, gallbladder  surgery and cerebral aneurysm clipped in 1997.  Former drug user,  nonsmoker, nondrinker.   Her medications are as follows:  1. Topamax 50 mg once a day.  2. Levothyroxine 75 mcg once a day.  3. Clonidine 0.1 mg twice a day.  4. Neurontin 30 mg 3 times a day.  5. Fioricet as needed.  6. Benazepril 10 mg p.o. once daily.  7. Seroquel XR 300 mg; the patient takes 2 at bedtime.  Seroquel 100      mg 1 p.o. at bedtime.  8. Protonix 40 mg once a day.  9. Norvasc 10 mg once a day.  10.Dilantin Kapseal oral 100 mg twice a day.  11.Multivitamins 1 tablet once a day.  12.Thiamine HCl 100 mg once a day.  13.Librium specialized dosing.  14.Trazodone 150 mg as needed.  15.Tylenol as needed.  16.Maalox as needed.  17.Milk of magnesia as needed.  18.Dulcolax as needed.  19.Librium 25 mg as needed.  20.Phenergan 25 mg as needed p.o. and injection as needed.  21.Diazepam 10 mg 3 times a day.  22.Phenytoin 200 mg twice a day.  23.Fluoxetine 20 mg once a day.  24.She also takes glycerin suppositories as needed.  25.Imodium as needed.   REVIEW OF SYSTEMS:  CONSTITUTIONAL:  Negative.  EYES:  No changes in  vision.  EARS, NOSE, MOUTH AND THROAT:  No changes in hearing or tasting  or sore throat.  HEART:  No chest pain or palpitations.  RESPIRATORY:  No shortness of breath.  GASTROINTESTINAL:  No nausea, vomiting,  abdominal pain.  GENITOURINARY:  No dysuria or hesitancy.  MUSCULOSKELETAL:  No myalgias or arthralgias.  PSYCHIATRIC:  Positive  depression.  HEMATOLOGIC:  No anemia.  NEUROLOGIC:  No loss of  consciousness.   Her vitals are as follows:  Blood pressure 120/60, pulse rate 78,  respirations 20, pulse oximetry 100%.  GENERAL:  The patient is a well-developed, well-nourished Caucasian  female who is in no acute distress.  EYES:  Are PERRL.  EOMI.  NOSE:  Turbinates are moist.  MOUTH:  Teeth  are in poor repair.  There is positive charcoal remnants.  NECK:  Supple.  No lymphadenopathy.  HEART:  Regular rate and rhythm.  S1-S2 present.  LUNGS:  Clear to auscultation bilaterally.  ABDOMEN:  Soft, nontender, nondistended.  EXTREMITIES:  Positive pulses.  No ecchymosis, edema or cyanosis.   LABORATORY DATA:  She has a Dilantin level of 6.9.  She also has alcohol  level less than 5.  Her metabolic panel:  Sodium 140, potassium 3.6,  chloride 107, carbon dioxide 25, glucose 97, BUN 12, creatinine 0.79.  She is only positive for benzodiazepines.  Her urine drug screen:  White  count 8.5, hemoglobin 15.5, hematocrit 38.9, platelet count of 185.    ASSESSMENT/PLAN:  1. Intentional overdose of Seroquel and Valium.  2. Major depression.   PLAN:  Will be to admit the patient to the service of [facility].  Will  place her on general medicine floor for observation.  Will repeat her  EKG in the morning.  She seems stable now and will ask the ACT team to  see.  The patient is interested in inpatient therapy.  Will place her on  her medications except for benzodiazepines and Seroquel.  Will continue  to monitor her progress.  I suspect the patient should be able to be  discharged within the next 23 hours.  We will change therapy as needed  if it needs to change.      Dorris Singh, DO  Electronically Signed     CB/MEDQ  D:  03/06/2008  T:  03/06/2008  Job:  [id number]

## 2010-09-06 NOTE — Discharge Summary (Signed)
NAME:  Robin Jensen, Robin Jensen NO.:  [id number]   MEDICAL RECORD NO.:  [id number]          PATIENT TYPE:  OBV   LOCATION:  A308                          FACILITY:  [facility]   PHYSICIAN:  Dorris Singh, DO    DATE OF BIRTH:  May 05, 1960   DATE OF ADMISSION:  03/06/2008  DATE OF DISCHARGE:  11/14/2009LH                               DISCHARGE SUMMARY   ADMISSION DIAGNOSES:  1. Intentional overdose of Seroquel and Valium.  2. Major depression.   DISCHARGE DIAGNOSES:  1. Intentional overdose of Seroquel and Valium resolved.  2. Major depression.  3. Suicide ideation.   CONSULTS:  ACT team.   TESTING THAT WAS DONE:  Include none.  No radiology testing.   Her H and P was done by Dr. Elige Radon.  Please refer to history of present  illness.  The patient was admitted to the service of [facility] and she  was monitored and given IV fluids.  A repeat of all blood work was done  this morning.  The patient was deemed to be stable.  Her EKG that had  changes yesterday, the previous EKG had some ST changes and today those  ST changes are within normal limits.  Also, the patient has had an issue  of elevated blood pressure.  Her blood pressures have been taken prior  to her antihypertensive administration.   PHYSICAL EXAMINATION:  VITAL SIGNS:  Her temperature is 98.6, pulse is  70, respirations 20, blood pressure 118/86.  GENERAL:  The patient is well-developed, well-nourished, in no acute  distress.  HEART:  Regular rate and rhythm.  LUNGS:  Clear to auscultation bilaterally.  ABDOMEN:  Soft, nontender, distended.   CURRENT LABS:  Values are as follows:  White count of 6.8, hemoglobin  12.3, hematocrit 35.4, platelet count 173, INR is normal.  Sodium 141,  potassium 3.9, chloride 108, CO2 26, glucose 102, BUN 9 and creatinine  0.75.  Her cardiac enzymes are within normal limits.   The medications that she will be sent to [facility] on  include:   1. Topamax 50 mg  once a day.  2. Levothyroxine 75 mg once a day.  3. Codeine 0.1 mg twice a day.  4. Neurontin 300 mg three times a day.  5. Fioricet, there is no dose given.  6. Benazepril 10 mg once a day.  7. Seroquel we have held that, however, she is on 300 mg two at      bedtime and 100 mg one at bedtime.  8. Protonix 40 mg once a day.  9. Norvasc 10 mg once a day.  10.Dilantin capsule 100 mg twice a day.  11.Multivitamin.  12.Also, thiamine 100 mg once a day.  13.Librium 25 mg as needed.  14.Trazodone 150 mg as needed.  15.Tylenol 650 mg needed.  16.Maalox 30 mL as needed.  17.Milk of Mag 30 mL as needed.  18.Dulcolax 5 mg as needed.  19.Dulcolax rectal 10 mg as needed.  20.Laxative rectal as needed.  21.Phenergan injection 25 mg as needed.  22.Phenergan 25 mg oral as needed.  23.Imodium 2-4 mg as needed.  24.Diazepam 10 mg 3 times a day.  25.Ventolin 200 mg twice a day.  26.Fluoxetine 20 mg once a day.   While the patient has been here we actually have withheld her Seroquel  and her Valium that she has been admitted.   CONDITION:  Is stable.   DISPOSITION:  Will be to [facility].  ACT  team has seen her and they have recommended inpatient psychiatric  services for her which the patient is also requesting.  I spent greater  than 30 minutes on this discharge summary.      Dorris Singh, DO  Electronically Signed     CB/MEDQ  D:  03/07/2008  T:  03/07/2008  Job:  [id number]
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
## 2010-09-06 NOTE — H&P (Signed)
NAME:  Robin Jensen, SHAMBAUGH NO.:  [id number]   MEDICAL RECORD NO.:  [id number]          PATIENT TYPE:  IPS   LOCATION:  0300                          FACILITY:  [facility]   PHYSICIAN:  Della Goo, M.D. DATE OF BIRTH:  1960-07-18   DATE OF ADMISSION:  02/17/2008  DATE OF DISCHARGE:                              HISTORY & PHYSICAL   PRIMARY CARE PHYSICIAN:  Unassigned.   CHIEF COMPLAINT:  Decreased responsiveness   HISTORY OF PRESENT ILLNESS:  This is a 50 year-old female who was sent  to the emergency department for evaluation from the [facility] secondary to decreased responsiveness.  She was admitted to  [facility] on October 21 for a voluntary admission for increased  auditory hallucinations and agitation for over 30 days.  Her symptoms  have been worsening and she was also having suicidal thoughts at that  time.  The patient [facility] and was sent from the center on October 25  secondary to decreasing responsiveness and an increased somnolence.   In the emergency department.  The patient was found to be obtunded.  She  also initially was checked and with mild hypotension.  Blood pressure 92  systolic.  She underwent an initial evaluation by the emergency  department physician which included laboratory studies and CT scanning  of the head.  Results of the CT scan of the head revealed no acute  disease process or acute intracranial abnormality.  A urinalysis also  had been sent and was found to be positive for leukocyte esterase and  nitrites.  The patient was started on antibiotic therapy and referred  for admission.   PAST MEDICAL HISTORY:  1. Hypertension.  2. Seizure disorder.  3. Migraine headaches.  4. History of severe depression.  5. Bipolar disorder.  6. History of a cerebral aneurysm repair in 1997.  7. Hypothyroidism.   MEDICATIONS AT THE TIME OF TRANSFER:  1.  Seroquel XR 900 mg p.o. q.h.s.  2. Cymbalta 30 mg p.o. daily.  3. Topamax 50 mg p.o. daily.  4. Valium 10 mg p.o. t.i.d.  5. Gabapentin 300 mg p.o. t.i.d.  6. Levothyroxine 75 mcg one p.o. daily.  7. Norvasc 10 mg one p.o. daily.  8. Clonidine 0.1 mg p.o. b.i.d.  9. Fioricet one tablet p.o. q.4 h p.r.n. severe migraine headaches.  10.Benazepril 10 mg p.o. daily.  11.Prozac 20 mg p.o. daily as the Cymbalta is weaned off, the Cymbalta      will be 30 mg daily for one week then discontinued.  12.Risperdal 1 mg p.o. q.6 h p.r.n.  13.Dilantin 100 mg p.o. b.i.d.  14.Trazodone 150 mg q.h.s.   ALLERGIES:  SULFA, PENICILLIN AND LATEX.   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  Currently a nonsmoker, nondrinker, former history of drug abuse.   FAMILY HISTORY:  Negative for coronary artery disease, type 2 diabetes  mellitus, hypertension or cancer.   REVIEW OF SYSTEMS:  Pertinents are mentioned above.   PHYSICAL EXAMINATION FINDINGS:  GENERAL:  This is  a morbidly obese 89-  year-old female who is currently obtunded but in no acute distress.  VITAL SIGNS:  Temperature 98.0, blood pressure 131/70, heart rate 76,  respirations 16, O2 saturation 95-97%.  HEENT:  Examination normocephalic, atraumatic.  Pupils are sluggish but  reactive to light.  Extraocular movements are intact.  Funduscopic  benign.  Oropharynx is clear.  NECK:  Supple, full range of motion.  No thyromegaly, adenopathy or  jugular venous distention.  CARDIOVASCULAR:  Regular rate and rhythm.  No murmurs, gallops or rubs.  LUNGS:  Clear to auscultation bilaterally.  ABDOMEN:  Positive bowel sounds, soft, nontender, nondistended.  EXTREMITIES: Without cyanosis, clubbing or edema.  NEUROLOGIC:  The patient is obtunded.  Her speech is slurred, however  there are no focal deficits on examination.  She is able to move all  four extremities.   LABORATORY STUDIES:  White blood cell count 5.2,  hemoglobin 11.4,  hematocrit 36.7, platelets 167.  Urinalysis positive urine nitrites,  large leukocyte esterase.  Sodium 138, potassium 3.9, chloride 110,  carbon dioxide 25, BUN 21, creatinine 0.87, glucose 110.  Lithium level  less than 0.25.  CT scan of the head without contrast negative for any  acute intracranial abnormalities.  Urine drug screen positive for  barbiturates and benzodiazepines.   ASSESSMENT:  A 50 year old female being admitted with:  1. Altered mental status most probably secondary to overmedication.  2. Urinary tract infection and possible early sepsis.  3. Transient hypotension.  4. Psych disorder.  5. Hypothyroidism.   PLAN:  The patient will be admitted to an area for cardiac monitoring.  The patient will be placed on IV antibiotic therapy of ciprofloxacin  along with IV fluids for fluid resuscitation.  Her psychiatric  medications will be placed on hold until and psychiatric evaluation has  been ordered for further adjustment in her medications.  Of note, the  patient  began to improve in her awareness and alertness as time went on in the  emergency department.  The patient will be placed on DVT and GI  prophylaxis and her electrolytes will be monitored and a TSH level will  be checked.  Further workup will ensue pending the patient's clinical  course.      Della Goo, M.D.  Electronically Signed     HJ/MEDQ  D:  02/18/2008  T:  02/18/2008  Job:  [id number]
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
## 2010-09-09 NOTE — Discharge Summary (Signed)
NAME:  Robin Jensen, KIGER NO.:  [id number]   MEDICAL RECORD NO.:  [id number]          PATIENT TYPE:  IPS   LOCATION:  0403                          FACILITY:  [facility]   PHYSICIAN:  [name], M.D. DATE OF BIRTH:  1960/10/27   DATE OF ADMISSION:  08/20/2006  DATE OF DISCHARGE:  08/27/2006                               DISCHARGE SUMMARY   The patient was on the adult unit.   IDENTIFYING INFORMATION:  [name] who was  admitted on an involuntary basis on August 20, 2006.   HISTORY OF PRESENT ILLNESS:  The patient's history was mainly gathered  from the petition papers.  The paper states she was suicidal with a  history of past attempts.  They state she has been off her meds for 2  weeks, recently in the hospital for 11 days for suicidal thoughts and  hallucinations.  She has a history of drug and alcohol use.  She has  been sober times 1 month according to her.  She had a plan to overdose.  This is the first [facility] admission for this patient. She has a history of  overdose on Seroquel and pain meds in the past.  She has a history of  depression.  She has seizure disorder, hypertension and hypothyroidism.  She is on numerous meds (see hospital course).  She is allergic to  penicillin and sulfa drugs and latex.   PHYSICAL FINDINGS:  The patient was a middle-aged female in no acute  medical or physical distress.  The patient's lithium level was less than  0.25.  CBC was within normal limits, CMET within normal limits, TSH  7.884, phenytoin level was 8(10 to 20).  Urine drug screen was negative.   HOSPITAL COURSE:  Upon admission, the patient was placed on Norvasc 10  mg p.o. daily, Deplin 7.5 mg p.o. daily, Crestor 10 mg p.o. daily,  Dilantin 300 mg p.o. q.h.s., hydrochlorothiazide 25 mg p.o. daily,  Seroquel 600 mg p.o. q.h.s., lithium carbonate 300 mg p.o. b.i.d.,  Cymbalta 60 mg p.o. daily. She was also placed on Ambien 10 mg p.o.  q.h.s.  p.r.n., may repeat times one if needed.   On August 21, 2006, the patient was placed on Seroquel 100 mg p.o. q.4  hours p.r.n. and Ativan 2 mg p.o. q.6 hours p.r.n.  On Aug 24, 2006,  lithium carbonate was increased to 300 mg p.o. b.i.d.  A repeat lithium  level done on Aug 26, 2006 was 0.6 and (0.8 to 1.4) an a.m. phenytoin  level was 5.4 (10 to 20).  The Ambien was discontinued and she was  started on Vistaril 50 mg p.o. q.h.s., may repeat times one p.r.n..  On  Aug 25, 2006 the Vistaril was discontinued and she was started on  trazodone 50 mg p.o. q.h.s. p.r.n.  On Aug 26, 2006, the patient was  started on Cogentin 1 mg now and q.8 hours p.r.n. EPS.  The patient  tolerated these medications well with no significant side effects.   Upon first meeting the patient on August 21, 2006, she admitted to a  history of alcohol and cocaine/crack abuse but none since November 7th.  She does state that she uses marijuana occasionally.  She is supposed to  be on Dilantin 300 mg q. a.m. as well as Seroquel, lithium, Depakote and  Cymbalta.  She had not stayed on these.  Her medications were continued  at Seroquel 600 mg p.o. q.h.s., lithium carbonate 300 mg p.o. b.i.d.,  Cymbalta 60 mg daily, Deplin 7.5 mg daily, Ambien 10 mg p.o. q.h.s.  p.r.n.  On Aug 23, 2006, the patient stated that she was anxious and  depressed because she could not go home.  She states suicidal ideation  comes and goes.  She appeared to be responding to internal stimuli and  was distracted as we talked.  There was no change in her medications.  On Aug 24, 2006, the patient discussed the family conflict that has been  bothering her at home.  She states she also has to take care of one  grandchild who lives with her.  Her appetite was poor, sleep was poor.  Lithium carbonate was increased from 300 mg p.o. daily to 300 mg p.o.  b.i.d.  Ambien was discontinued and Vistaril started due to continued  insomnia. On Aug 25, 2006, the patient  continued to have trouble  sleeping.  The Vistaril was discontinued and she was placed on trazodone  50 mg p.o. q.h.s.  On Aug 25, 2005, the patient was found to have a  dilantin level of 5.4 (10 to 20) and a lithium level of 0.6 (0.8 to  1.4).   On Aug 27, 2006, the patient's mental status had improved markedly.  She  had good eye contact.  Speech was still somewhat soft and slow but  improved from admission status.  Psychomotor activity was within normal  limits.  Mood was euthymic.  Affect wide range.  There was no suicidal  or homicidal ideation.  No thoughts of self-injurious behavior.  No  paranoia or delusions.  Thoughts were logical and goal-directed.  Thought content:  No predominant theme.  The cognitive was grossly  within normal limits.  The patient was felt safe to be discharged home  today.  Her husband had also agreed that he felt she was ready.   DISCHARGE DIAGNOSES:  AXIS I:  Mood disorder NOS.  Polysubstance  dependence.  AXIS II:  None.  AXIS III:  Hypertension, seizures, hypothyroidism.  AXIS IV:  Severe (problems with primary support group, problems related  to social environment.  Housing problem.  Other psychosocial problems -  burden of psychiatric illness and medical problems).  AXIS V:  GAF upon discharge was 48.  GAF upon admission was 30.  GAF  highest past year 24.   DISCHARGE/PLAN:  There were no specific activity level or dietary  restrictions.  Post hospital care plans:  The patient will be seen by  [facility] on Tuesday May 6th at 9 o'clock a.m. for  medication management.  She will be seen by her counselor, Garnet Koyanagi, on  Tuesday May 6th at 11 o'clock a.m.   DISCHARGE MEDICATIONS:  Norvasc 10 mg daily, Crestor 10 mg daily,  Dilantin 300 mg p.o. q.h.s.,. hydrochlorothiazide 25 mg daily, Seroquel  300 mg 2 pills at bedtime, lithium carbonate 300 mg twice daily, Cymbalta 60 mg daily, Deplin 7.5 mg daily, trazodone 50 mg at bedtime,  may  repeat times one if needed.      [name], M.D.  Electronically Signed  BHS/MEDQ  D:  08/27/2006  T:  08/28/2006  Job:  [id number]

## 2010-10-25 ENCOUNTER — Encounter (HOSPITAL_COMMUNITY): Payer: Self-pay

## 2011-01-12 LAB — BASIC METABOLIC PANEL
BUN: 6
CO2: 28
Chloride: 105
Potassium: 4

## 2011-01-12 LAB — LITHIUM LEVEL: Lithium Lvl: 1.48 — ABNORMAL HIGH

## 2011-01-12 LAB — CBC
HCT: 34.7 — ABNORMAL LOW
MCHC: 34.5
MCV: 88.5
Platelets: 245
RBC: 3.92
WBC: 7.1

## 2011-01-17 LAB — RAPID URINE DRUG SCREEN, HOSP PERFORMED
Amphetamines: NOT DETECTED
Cocaine: NOT DETECTED
Tetrahydrocannabinol: POSITIVE — AB

## 2011-01-17 LAB — CBC
HCT: 33.3 — ABNORMAL LOW
Hemoglobin: 11.8 — ABNORMAL LOW
MCHC: 35.3
RDW: 13.1

## 2011-01-17 LAB — DIFFERENTIAL
Basophils Absolute: 0
Basophils Relative: 1
Eosinophils Relative: 3
Lymphocytes Relative: 32
Monocytes Absolute: 0.5
Monocytes Relative: 6

## 2011-01-17 LAB — TSH
TSH: 20.927 — ABNORMAL HIGH
TSH: 26.806 — ABNORMAL HIGH

## 2011-01-17 LAB — BASIC METABOLIC PANEL
CO2: 28
Glucose, Bld: 86
Potassium: 3.6
Sodium: 135

## 2011-01-17 LAB — COMPREHENSIVE METABOLIC PANEL
BUN: 11
Calcium: 9.6
Glucose, Bld: 154 — ABNORMAL HIGH
Sodium: 136
Total Protein: 6.3

## 2011-01-17 LAB — LITHIUM LEVEL
Lithium Lvl: 1.06
Lithium Lvl: 1.64 — ABNORMAL HIGH

## 2011-01-17 LAB — T4, FREE: Free T4: 0.71 — ABNORMAL LOW

## 2011-01-17 LAB — T3, FREE: T3, Free: 2.3 (ref 2.3–4.2)

## 2011-01-17 LAB — PHENYTOIN LEVEL, TOTAL: Phenytoin Lvl: 5 — ABNORMAL LOW

## 2011-01-18 LAB — CBC
MCV: 88.1
Platelets: 185
RBC: 4.45
WBC: 6.7

## 2011-01-18 LAB — BASIC METABOLIC PANEL
Chloride: 105
Creatinine, Ser: 0.79
GFR calc Af Amer: >60
GFR calc non Af Amer: >60

## 2011-01-20 LAB — RAPID URINE DRUG SCREEN, HOSP PERFORMED
Amphetamines: NOT DETECTED
Amphetamines: NOT DETECTED
Barbiturates: POSITIVE — AB
Cocaine: NOT DETECTED
Cocaine: NOT DETECTED
Opiates: NOT DETECTED
Opiates: NOT DETECTED
Tetrahydrocannabinol: NOT DETECTED
Tetrahydrocannabinol: POSITIVE — AB

## 2011-01-20 LAB — DIFFERENTIAL
Basophils Absolute: 0
Eosinophils Relative: 1
Lymphocytes Relative: 47 — ABNORMAL HIGH
Lymphocytes Relative: 37
Lymphs Abs: 3.1
Lymphs Abs: 3
Monocytes Absolute: 0.5
Monocytes Absolute: 0.5
Monocytes Relative: 8
Monocytes Relative: 6
Neutro Abs: 2.8
Neutro Abs: 4.6
Neutrophils Relative %: 43

## 2011-01-20 LAB — CBC
HCT: 41
Hemoglobin: 12
Hemoglobin: 13.7
RBC: 4.05
RBC: 4.71
RDW: 13.7
WBC: 6.5
WBC: 8.2

## 2011-01-20 LAB — URINALYSIS, ROUTINE W REFLEX MICROSCOPIC
Bilirubin Urine: NEGATIVE
Bilirubin Urine: NEGATIVE
Glucose, UA: NEGATIVE
Hgb urine dipstick: NEGATIVE
Hgb urine dipstick: NEGATIVE
Protein, ur: NEGATIVE
Specific Gravity, Urine: <1.005 — ABNORMAL LOW
Urobilinogen, UA: 0.2
pH: 5.5

## 2011-01-20 LAB — HEPATIC FUNCTION PANEL
ALT: 26
AST: 25
Albumin: 4
Total Bilirubin: 0.5

## 2011-01-20 LAB — ETHANOL
Alcohol, Ethyl (B): <5
Alcohol, Ethyl (B): <5

## 2011-01-20 LAB — BASIC METABOLIC PANEL
Calcium: 9.4
GFR calc Af Amer: >60
GFR calc non Af Amer: >60
Potassium: 3.7
Sodium: 139

## 2011-01-20 LAB — PHENYTOIN LEVEL, TOTAL: Phenytoin Lvl: <2.5 — ABNORMAL LOW

## 2011-01-23 LAB — DIFFERENTIAL
Basophils Relative: 1
Eosinophils Absolute: 0.1
Eosinophils Relative: 1
Lymphocytes Relative: 42
Lymphs Abs: 2.3
Monocytes Absolute: 0.5
Monocytes Relative: 8
Neutro Abs: 2.6
Neutrophils Relative %: 47

## 2011-01-23 LAB — CBC
HCT: 37.8
Hemoglobin: 12.8
Hemoglobin: 13.2
MCHC: 34.8
MCV: 87.7
Platelets: 164
Platelets: 183
RBC: 4.29
RBC: 4.32
WBC: 9.7
WBC: 5.5

## 2011-01-23 LAB — BASIC METABOLIC PANEL
BUN: 19
CO2: 31
Calcium: 9.3
Chloride: 103
Creatinine, Ser: 0.9
Creatinine, Ser: 0.96
GFR calc Af Amer: >60
GFR calc non Af Amer: >60
Glucose, Bld: 87
Potassium: 3.7
Sodium: 139

## 2011-01-23 LAB — COMPREHENSIVE METABOLIC PANEL
AST: 57 — ABNORMAL HIGH
Albumin: 3.5
Chloride: 105
Creatinine, Ser: 0.83
GFR calc Af Amer: >60
Potassium: 3.9
Sodium: 137
Total Bilirubin: 0.6

## 2011-01-23 LAB — POCT CARDIAC MARKERS: Troponin i, poc: <0.05

## 2011-01-23 LAB — RAPID URINE DRUG SCREEN, HOSP PERFORMED
Benzodiazepines: POSITIVE — AB
Cocaine: NOT DETECTED
Tetrahydrocannabinol: NOT DETECTED

## 2011-01-23 LAB — CK TOTAL AND CKMB (NOT AT ARMC)
Relative Index: 1
Relative Index: INVALID
Total CK: 86

## 2011-01-23 LAB — APTT: aPTT: 27

## 2011-01-23 LAB — PHENYTOIN LEVEL, TOTAL
Phenytoin Lvl: 7.4 — ABNORMAL LOW
Phenytoin Lvl: 8.7 — ABNORMAL LOW

## 2011-01-23 LAB — ETHANOL: Alcohol, Ethyl (B): <5

## 2011-01-24 LAB — RAPID URINE DRUG SCREEN, HOSP PERFORMED
Amphetamines: NOT DETECTED
Barbiturates: NOT DETECTED
Benzodiazepines: POSITIVE — AB
Benzodiazepines: POSITIVE — AB
Opiates: NOT DETECTED
Opiates: NOT DETECTED
Tetrahydrocannabinol: NOT DETECTED

## 2011-01-24 LAB — CBC
HCT: 33.7 — ABNORMAL LOW
HCT: 33.9 — ABNORMAL LOW
HCT: 38.9
Hemoglobin: 11.8 — ABNORMAL LOW
Hemoglobin: 12.3
Hemoglobin: 13.5
MCHC: 34.6
MCHC: 34.8
MCV: 88.2
MCV: 87.8
Platelets: 167
Platelets: 189
RBC: 4.02
RBC: 4.43
RDW: 14
RDW: 14
RDW: 13.7
WBC: 5.2

## 2011-01-24 LAB — CK TOTAL AND CKMB (NOT AT ARMC)
CK, MB: 1.4
Total CK: 75

## 2011-01-24 LAB — DIFFERENTIAL
Eosinophils Absolute: 0.2
Eosinophils Absolute: 0.1
Eosinophils Relative: 3
Lymphocytes Relative: 34
Lymphocytes Relative: 46
Lymphs Abs: 2.7
Lymphs Abs: 2.9
Monocytes Absolute: 0.6
Monocytes Absolute: 0.7
Monocytes Relative: 11
Neutro Abs: 2.7
Neutro Abs: 4.7
Neutrophils Relative %: 56

## 2011-01-24 LAB — COMPREHENSIVE METABOLIC PANEL
ALT: 23
BUN: 12
CO2: 25
Calcium: 9.3
Creatinine, Ser: 0.79
GFR calc non Af Amer: >60
Glucose, Bld: 97
Total Protein: 6.8

## 2011-01-24 LAB — BASIC METABOLIC PANEL
BUN: 21
BUN: 9
CO2: 26
Calcium: 8.6
Calcium: 8.7
GFR calc non Af Amer: >60
Glucose, Bld: 102 — ABNORMAL HIGH
Potassium: 3.9
Sodium: 141

## 2011-01-24 LAB — CARDIAC PANEL(CRET KIN+CKTOT+MB+TROPI)
CK, MB: 1.1
CK, MB: 1.1
Total CK: 74
Total CK: 91
Troponin I: 0.01

## 2011-01-24 LAB — URINALYSIS, ROUTINE W REFLEX MICROSCOPIC
Glucose, UA: NEGATIVE
Hgb urine dipstick: NEGATIVE
Ketones, ur: NEGATIVE
Nitrite: POSITIVE — AB
Protein, ur: NEGATIVE
Specific Gravity, Urine: 1.019
Specific Gravity, Urine: 1.007
Urobilinogen, UA: 0.2
pH: 6
pH: 7

## 2011-01-24 LAB — SALICYLATE LEVEL: Salicylate Lvl: <4

## 2011-01-24 LAB — CULTURE, BLOOD (ROUTINE X 2)

## 2011-01-24 LAB — PHENYTOIN LEVEL, TOTAL: Phenytoin Lvl: 6.9 — ABNORMAL LOW

## 2011-01-24 LAB — TROPONIN I: Troponin I: 0.01

## 2011-01-24 LAB — URINE MICROSCOPIC-ADD ON

## 2011-01-24 LAB — APTT: aPTT: 34

## 2012-07-07 ENCOUNTER — Other Ambulatory Visit: Payer: Self-pay | Admitting: *Deleted

## 2012-07-07 DIAGNOSIS — Z780 Asymptomatic menopausal state: Secondary | ICD-10-CM

## 2012-07-11 ENCOUNTER — Telehealth: Payer: Self-pay

## 2012-07-11 NOTE — Telephone Encounter (Signed)
Called pt to triage for colonoscopy. Needs OV due to meds. Scheduled with [facility] at 11:00 on 07/30/2012.

## 2012-07-16 ENCOUNTER — Telehealth: Payer: Self-pay

## 2012-07-16 NOTE — Telephone Encounter (Signed)
Patient went to mobile mammogram visit today .  They deferred patient to facility due to L breast pain    Need a order in EPIC for Diagnostic mammogram to [facility] in [place]

## 2012-07-16 NOTE — Telephone Encounter (Signed)
Robin Jensen I dont know what to do

## 2012-07-18 ENCOUNTER — Other Ambulatory Visit: Payer: Self-pay | Admitting: Nurse Practitioner

## 2012-07-18 DIAGNOSIS — N644 Mastodynia: Secondary | ICD-10-CM

## 2012-07-18 NOTE — Telephone Encounter (Signed)
Go ahead and order this how you did Georgina Pillion .  They think they have the problem figured out

## 2012-07-30 ENCOUNTER — Telehealth: Payer: Self-pay | Admitting: Gastroenterology

## 2012-07-30 ENCOUNTER — Ambulatory Visit: Payer: Self-pay | Admitting: Gastroenterology

## 2012-07-30 NOTE — Telephone Encounter (Signed)
Pt was a no show

## 2012-08-07 ENCOUNTER — Encounter: Payer: Self-pay | Admitting: Gastroenterology

## 2012-08-07 ENCOUNTER — Ambulatory Visit (INDEPENDENT_AMBULATORY_CARE_PROVIDER_SITE_OTHER): Payer: Medicare Other | Admitting: Gastroenterology

## 2012-08-07 ENCOUNTER — Telehealth: Payer: Self-pay | Admitting: Nurse Practitioner

## 2012-08-07 VITALS — BP 160/110 | HR 82 | Temp 97.4°F | Ht 61.0 in | Wt 222.6 lb

## 2012-08-07 DIAGNOSIS — R7989 Other specified abnormal findings of blood chemistry: Secondary | ICD-10-CM

## 2012-08-07 DIAGNOSIS — I10 Essential (primary) hypertension: Secondary | ICD-10-CM | POA: Insufficient documentation

## 2012-08-07 DIAGNOSIS — R109 Unspecified abdominal pain: Secondary | ICD-10-CM

## 2012-08-07 DIAGNOSIS — Z800 Family history of malignant neoplasm of digestive organs: Secondary | ICD-10-CM

## 2012-08-07 DIAGNOSIS — R945 Abnormal results of liver function studies: Secondary | ICD-10-CM | POA: Insufficient documentation

## 2012-08-07 DIAGNOSIS — K5900 Constipation, unspecified: Secondary | ICD-10-CM

## 2012-08-07 MED ORDER — POLYETHYLENE GLYCOL 3350 17 GM/SCOOP PO POWD: 17.0000 g | Freq: Every day | ORAL | Status: DC

## 2012-08-07 MED ORDER — PEG 3350-KCL-NA BICARB-NACL 420 G PO SOLR: 4000.0000 mL | ORAL | Status: DC

## 2012-08-07 NOTE — Patient Instructions (Addendum)
For constipation, please try MiraLax, 1 capful daily as needed. Prescription sent to your pharmacy. We have scheduled you for a colonoscopy with Dr. Darrick Penna. Please see separate instructions. Please call if you're abdominal pain does not improve. Go to the Emergency Department if your pain worsens or you develop fever.  Please call your family doctor today and let them know your blood pressure is uncontrolled. BP 160/110.

## 2012-08-07 NOTE — Assessment & Plan Note (Signed)
Abnormal AST and ALT in 2012 on multiple occasions. Current labs unavailable to me at this time. Patient is high risk for viral hepatitis. I offered her lab work for screening purposes but she is not interested at this time.

## 2012-08-07 NOTE — Assessment & Plan Note (Signed)
Blood pressure not adequately controlled today. I have asked her to discuss with her PCP this afternoon.

## 2012-08-07 NOTE — Assessment & Plan Note (Signed)
Add MiraLax 17 g daily as needed for constipation.

## 2012-08-07 NOTE — Telephone Encounter (Signed)
NTBS.

## 2012-08-07 NOTE — Progress Notes (Signed)
Cc PCP 

## 2012-08-07 NOTE — Assessment & Plan Note (Signed)
Recommend colonoscopy for high-risk screening purposes. Given her psychiatric disease, polypharmacy I recommended that she have deep sedation in the OR.  I have discussed the risks, alternatives, benefits with regards to but not limited to the risk of reaction to medication, bleeding, infection, perforation and the patient is agreeable to proceed. Written consent to be obtained.

## 2012-08-07 NOTE — Progress Notes (Signed)
Primary Care Physician:  Bennie Pierini, FNP  Primary Gastroenterologist:  Jonette Eva, MD   Chief Complaint  Patient presents with  . Colonoscopy    HPI:  Robin Jensen is a 52 y.o. female here to schedule her first ever colonoscopy. She complains of chronic constipation. About one BM per week. Strains alot. Not on any medication for it. No melena, brbpr. No abdominal pain, vomiting, heartburn, dysphagia, weight loss. Last seizure last year. Right middle abdominal pain unrelated to meals for the past one to 2 weeks. States she was out of her psychiatric medications for a couple weeks because she missed an appointment with her psychiatrist, she just resumed them 3 days ago. She denies any suicidal ideation at this time.   H/O abnormal AST/ALT in 2012. Patient with remote h/o IV and intranasal drug use. Offered to screen her for Hepatitis B/C but she refused.  Current Outpatient Prescriptions  Medication Sig Dispense Refill  . ARIPiprazole (ABILIFY) 5 MG tablet Take 5 mg by mouth daily.      Marland Kitchen atorvastatin (LIPITOR) 40 MG tablet Take 40 mg by mouth daily.      . furosemide (LASIX) 40 MG tablet Take 40 mg by mouth daily.      Marland Kitchen labetalol (NORMODYNE) 200 MG tablet Take 200 mg by mouth 2 (two) times daily.      Marland Kitchen levothyroxine (SYNTHROID, LEVOTHROID) 75 MCG tablet Take 75 mcg by mouth daily before breakfast.      . mirtazapine (REMERON) 30 MG tablet Take 30 mg by mouth at bedtime.      . phenytoin (DILANTIN) 200 MG ER capsule Take 100 mg by mouth 2 (two) times daily. Take two capsules in am 2 in pm      . thiothixene (NAVANE) 10 MG capsule Take 10 mg by mouth at bedtime as needed.      . trihexyphenidyl (ARTANE) 5 MG tablet Take 5 mg by mouth 2 (two) times daily with a meal.      . Verapamil HCl CR 300 MG CP24 Take 300 mg by mouth daily.      Marland Kitchen zolpidem (AMBIEN) 10 MG tablet Take 10 mg by mouth at bedtime as needed for sleep.       No current facility-administered medications for  this visit.    Allergies as of 08/07/2012 - Review Complete 08/07/2012  Allergen Reaction Noted  . Penicillins Shortness Of Breath and Rash 06/03/2010  . Latex  06/03/2010  . Sulfa antibiotics  06/03/2010    Past Medical History  Diagnosis Date  . Cancer     uterine  . Hypertension   . Bipolar 1 disorder   . Hyperlipidemia   . GERD (gastroesophageal reflux disease)   . Hypothyroid   . Seizure disorder   . Migraine   . Anxiety   . Depression   . Suicidal ideation     attempted overdoses in past    Past Surgical History  Procedure Laterality Date  . Radical hysterectomy    . Cholecystectomy    . Cerebral aneurysm repair  1997    Family History  Problem Relation Age of Onset  . Colon cancer Father     less than age 71  . Colon cancer Paternal Uncle     85s  . Colon cancer Paternal Grandfather     History   Social History  . Marital Status: Divorced    Spouse Name: N/A    Number of Children: 2  . Years of Education:  N/A   Occupational History  . Not on file.   Social History Main Topics  . Smoking status: Never Smoker   . Smokeless tobacco: Not on file  . 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 Drug Use: No     Comment: H/O marijuana and cocaine, in 1980s  . Sexually Active: Not on file   Other Topics Concern  . Not on file   Social History Narrative   ** Merged History Encounter **          ROS:  General: Negative for anorexia, weight loss, fever, chills, fatigue, weakness. Eyes: Negative for vision changes.  ENT: Negative for hoarseness, difficulty swallowing , nasal congestion. CV: Negative for chest pain, angina, palpitations, dyspnea on exertion, peripheral edema.  Respiratory: Negative for dyspnea at rest, dyspnea on exertion, cough, sputum, wheezing.  GI: See history of present illness. GU:  Negative for dysuria, hematuria, urinary incontinence, urinary frequency, nocturnal urination.  MS: Negative for joint pain, low back  pain.  Derm: Negative for rash or itching.  Neuro: Negative for weakness, abnormal sensation, seizure, frequent headaches, memory loss, confusion.  Psych: Chronic anxiety, depression. Denies suicidal ideation, hallucinations.  Endo: Negative for unusual weight change.  Heme: Negative for bruising or bleeding. Allergy: Negative for rash or hives.    Physical Examination:  BP 160/110  Pulse 82  Temp(Src) 97.4 F (36.3 C) (Oral)  Ht 5\' 1"  (1.549 m)  Wt 222 lb 9.6 oz (100.971 kg)  BMI 42.08 kg/m2   General: Well-nourished, well-developed in no acute distress.  Head: Normocephalic, atraumatic.   Eyes: Conjunctiva pink, no icterus. Mouth: Oropharyngeal mucosa moist and pink , no lesions erythema or exudate. Neck: Supple without thyromegaly, masses, or lymphadenopathy.  Lungs: Clear to auscultation bilaterally.  Heart: Regular rate and rhythm, no murmurs rubs or gallops.  Abdomen: Bowel sounds are normal, mild right mid abdominal tenderness to deep palpation, nondistended, no hepatosplenomegaly or masses, no abdominal bruits or    hernia , no rebound or guarding.   Rectal: Deferred Extremities: No lower extremity edema. No clubbing or deformities.  Neuro: Alert and oriented x 4 , grossly normal neurologically.  Skin: Warm and dry, no rash or jaundice.   Psych: Alert and cooperative, normal mood and affect.

## 2012-08-07 NOTE — Assessment & Plan Note (Signed)
One-week history of right-sided abdominal pain likely related to constipation. Doubt we are dealing with appendicitis. Discussed warning signs including increasing abdominal pain, fever, vomiting. If any of these develop, she should go to the emergency department.

## 2012-08-07 NOTE — Telephone Encounter (Signed)
Please advise 

## 2012-08-08 ENCOUNTER — Telehealth: Payer: Self-pay | Admitting: Nurse Practitioner

## 2012-08-08 NOTE — Telephone Encounter (Signed)
Pt aware and appt made for 4/23 at 8:15

## 2012-08-08 NOTE — Telephone Encounter (Signed)
See previous telephone encounter.

## 2012-08-09 ENCOUNTER — Encounter (HOSPITAL_COMMUNITY): Payer: Self-pay | Admitting: Pharmacy Technician

## 2012-08-14 ENCOUNTER — Ambulatory Visit (INDEPENDENT_AMBULATORY_CARE_PROVIDER_SITE_OTHER): Payer: Medicare Other | Admitting: Nurse Practitioner

## 2012-08-14 ENCOUNTER — Encounter: Payer: Self-pay | Admitting: Nurse Practitioner

## 2012-08-14 VITALS — BP 152/92 | HR 76 | Temp 98.5°F | Ht 61.0 in | Wt 229.0 lb

## 2012-08-14 DIAGNOSIS — R569 Unspecified convulsions: Secondary | ICD-10-CM

## 2012-08-14 DIAGNOSIS — E785 Hyperlipidemia, unspecified: Secondary | ICD-10-CM

## 2012-08-14 DIAGNOSIS — I10 Essential (primary) hypertension: Secondary | ICD-10-CM

## 2012-08-14 DIAGNOSIS — G4700 Insomnia, unspecified: Secondary | ICD-10-CM

## 2012-08-14 DIAGNOSIS — I671 Cerebral aneurysm, nonruptured: Secondary | ICD-10-CM

## 2012-08-14 DIAGNOSIS — E039 Hypothyroidism, unspecified: Secondary | ICD-10-CM | POA: Insufficient documentation

## 2012-08-14 MED ORDER — LOSARTAN POTASSIUM 50 MG PO TABS: 50.0000 mg | ORAL_TABLET | Freq: Every day | ORAL | Status: DC

## 2012-08-14 NOTE — Patient Instructions (Signed)

## 2012-08-14 NOTE — Progress Notes (Signed)
Subjective:    Patient ID: Robin Jensen, female    DOB: 1960/12/23, 52 y.o.   MRN: [id number]  Hypertension This is a chronic problem. The current episode started more than 1 year ago. The problem has been gradually worsening since onset. The problem is uncontrolled. Pertinent negatives include no chest pain, headaches, neck pain, palpitations, peripheral edema, PND or shortness of breath. There are no associated agents to hypertension. Risk factors for coronary artery disease include dyslipidemia, obesity, stress and post-menopausal state. Past treatments include beta blockers and calcium channel blockers. The current treatment provides mild improvement. Compliance problems include diet and exercise.  Hypertensive end-organ damage includes a thyroid problem.  Hyperlipidemia This is a chronic problem. The current episode started more than 1 year ago. The problem is uncontrolled. Recent lipid tests were reviewed and are variable. Exacerbating diseases include hypothyroidism. Pertinent negatives include no chest pain, focal sensory loss, leg pain, myalgias or shortness of breath. Current antihyperlipidemic treatment includes statins. The current treatment provides moderate improvement of lipids. Compliance problems include adherence to diet and adherence to exercise.  Risk factors for coronary artery disease include obesity, post-menopausal and hypertension.  Migraines Has had migraines since she had aneurysm clamped in 1997. Takes topamax which helps. Seizures Scondary to brian surgery for cerebral aneurysm. Last seizure was over a year ago. Takes dilantin. Does n't see neurologist anymore. Insomnia Takes Remus Loffler which works well. Feels rested upon awakening Hypothyroidism Levothyroxine qd. No c/o fatigue. Bipolar  Goes to Psych- Doing OK- Dose of Navane increased yesterday   Review of Systems  Constitutional: Negative.   HENT: Positive for ear pain (left). Negative for neck pain.    Eyes: Negative.   Respiratory: Negative.  Negative for shortness of breath.   Cardiovascular: Negative.  Negative for chest pain, palpitations and PND.  Musculoskeletal: Negative for myalgias.  Neurological: Negative for headaches.  Psychiatric/Behavioral: Negative.        Objective:   Physical Exam  Constitutional: She is oriented to person, place, and time. She appears well-developed and well-nourished.  HENT:  Nose: Nose normal.  Mouth/Throat: Oropharynx is clear and moist.  Eyes: EOM are normal.  Neck: Trachea normal, normal range of motion and full passive range of motion without pain. Neck supple. No JVD present. Carotid bruit is not present. No thyromegaly present.  Cardiovascular: Normal rate, regular rhythm, normal heart sounds and intact distal pulses.  Exam reveals no gallop and no friction rub.   No murmur heard. Pulmonary/Chest: Effort normal and breath sounds normal.  Abdominal: Soft. Bowel sounds are normal. She exhibits no distension and no mass. There is no tenderness.  Musculoskeletal: Normal range of motion.  Lymphadenopathy:    She has no cervical adenopathy.  Neurological: She is alert and oriented to person, place, and time. She has normal reflexes.  Skin: Skin is warm and dry.  Psychiatric: She has a normal mood and affect. Her behavior is normal. Judgment and thought content normal.   BP 152/92  Pulse 76  Temp(Src) 98.5 F (36.9 C) (Oral)  Ht 5\' 1"  (1.549 m)  Wt 229 lb (103.874 kg)  BMI 43.29 kg/m2        Assessment & Plan:  Aneurysm, cerebral, nonruptured  Unspecified hypothyroidism  Other and unspecified hyperlipidemia  Seizures  Insomnia  Essential hypertension, benign - Plan: losartan (COZAAR) 50 MG tablet  Continue current meds Add Losartan 50mg  1 PO Qd to meds Labs reviewed at appointment Low fat diet and exercise Keep check of BP  at home Keep F/U appointments with Psych  Mary-Margaret Daphine Deutscher, FNP

## 2012-08-15 ENCOUNTER — Other Ambulatory Visit: Payer: Self-pay

## 2012-08-15 ENCOUNTER — Encounter (HOSPITAL_COMMUNITY): Payer: Self-pay

## 2012-08-15 ENCOUNTER — Encounter (HOSPITAL_COMMUNITY)
Admission: RE | Admit: 2012-08-15 | Discharge: 2012-08-15 | Disposition: A | Discharge status: Home / Self Care | Payer: Medicare Other | Source: Ambulatory Visit | Attending: Gastroenterology | Admitting: Gastroenterology

## 2012-08-15 HISTORY — DX: Unspecified convulsions: R56.9

## 2012-08-15 HISTORY — DX: Cerebral infarction, unspecified: I63.9

## 2012-08-15 HISTORY — DX: Acute embolism and thrombosis of unspecified deep veins of unspecified lower extremity: I82.409

## 2012-08-15 LAB — BASIC METABOLIC PANEL
Calcium: 8.9 mg/dL (ref 8.4–10.5)
GFR calc non Af Amer: 60 mL/min — ABNORMAL LOW (ref >90)
Glucose, Bld: 95 mg/dL (ref 70–99)
Sodium: 138 mEq/L (ref 135–145)

## 2012-08-15 LAB — HEMOGLOBIN AND HEMATOCRIT, BLOOD: HCT: 34.4 % — ABNORMAL LOW (ref 36.0–46.0)

## 2012-08-15 NOTE — Pre-Procedure Instructions (Signed)
Patients blood pressure 160/100 right and 172/102 left. She states she ahs been to PCP several times in last few days due to Hypertension. Also states they added 1 med to her list for blood pressure that she just started taking yesterday. Stressed with patient importance of taking BP meds and she states she will take them consistently and does take her meds that way at home.

## 2012-08-15 NOTE — Patient Instructions (Addendum)
DEEMA JUNCAJ  08/15/2012   Your procedure is scheduled on:  08/20/2012  Report to [facility] at  800  AM.  Call this number if you have problems the morning of surgery: [phone]   Remember:   Do not eat food or drink liquids after midnight.   Take these medicines the morning of surgery with A SIP OF WATER: cozaar,abilify,valium,labetolol,synthroid,remeron,dialntin,topamax,artane,navane,verapamil.   Do not wear jewelry, make-up or nail polish.  Do not wear lotions, powders, or perfumes.   Do not shave 48 hours prior to surgery. Men may shave face and neck.  Do not bring valuables to the hospital.  Contacts, dentures or bridgework may not be worn into surgery.  Leave suitcase in the car. After surgery it may be brought to your room.  For patients admitted to the hospital, checkout time is 11:00 AM the day of discharge.   Patients discharged the day of surgery will not be allowed to drive  home.  Name and phone number of your driver: family  Special Instructions: N/A   Please read over the following fact sheets that you were given: Pain Booklet, Coughing and Deep Breathing, MRSA Information, Surgical Site Infection Prevention, Anesthesia Post-op Instructions and Care and Recovery After Surgery Colonoscopy A colonoscopy is an exam to evaluate your entire colon. In this exam, your colon is cleansed. A long fiberoptic tube is inserted through your rectum and into your colon. The fiberoptic scope (endoscope) is a long bundle of enclosed and very flexible fibers. These fibers transmit light to the area examined and send images from that area to your caregiver. Discomfort is usually minimal. You may be given a drug to help you sleep (sedative) during or prior to the procedure. This exam helps to detect lumps (tumors), polyps, inflammation, and areas of bleeding. Your caregiver may also take a small piece of tissue (biopsy) that will be examined under a microscope. LET YOUR CAREGIVER  KNOW ABOUT:   Allergies to food or medicine.  Medicines taken, including vitamins, herbs, eyedrops, over-the-counter medicines, and creams.  Use of steroids (by mouth or creams).  Previous problems with anesthetics or numbing medicines.  History of bleeding problems or blood clots.  Previous surgery.  Other health problems, including diabetes and kidney problems.  Possibility of pregnancy, if this applies. BEFORE THE PROCEDURE   A clear liquid diet may be required for 2 days before the exam.  Ask your caregiver about changing or stopping your regular medications.  Liquid injections (enemas) or laxatives may be required.  A large amount of electrolyte solution may be given to you to drink over a short period of time. This solution is used to clean out your colon.  You should be present 60 minutes prior to your procedure or as directed by your caregiver. AFTER THE PROCEDURE   If you received a sedative or pain relieving medication, you will need to arrange for someone to drive you home.  Occasionally, there is a little blood passed with the first bowel movement. Do not be concerned. FINDING OUT THE RESULTS OF YOUR TEST Not all test results are available during your visit. If your test results are not back during the visit, make an appointment with your caregiver to find out the results. Do not assume everything is normal if you have not heard from your caregiver or the medical facility. It is important for you to follow up on all of your test results. HOME CARE INSTRUCTIONS   It is  not unusual to pass moderate amounts of gas and experience mild abdominal cramping following the procedure. This is due to air being used to inflate your colon during the exam. Walking or a warm pack on your belly (abdomen) may help.  You may resume all normal meals and activities after sedatives and medicines have worn off.  Only take over-the-counter or prescription medicines for pain, discomfort,  or fever as directed by your caregiver. Do not use aspirin or blood thinners if a biopsy was taken. Consult your caregiver for medicine usage if biopsies were taken. SEEK IMMEDIATE MEDICAL CARE IF:   You have a fever.  You pass large blood clots or fill a toilet with blood following the procedure. This may also occur 10 to 14 days following the procedure. This is more likely if a biopsy was taken.  You develop abdominal pain that keeps getting worse and cannot be relieved with medicine. Document Released: 04/07/2000 Document Revised: 07/03/2011 Document Reviewed: 11/21/2007 [facility] Patient Information 2013 [place], [place]. PATIENT INSTRUCTIONS POST-ANESTHESIA  IMMEDIATELY FOLLOWING SURGERY:  Do not drive or operate machinery for the first twenty four hours after surgery.  Do not make any important decisions for twenty four hours after surgery or while taking narcotic pain medications or sedatives.  If you develop intractable nausea and vomiting or a severe headache please notify your doctor immediately.  FOLLOW-UP:  Please make an appointment with your surgeon as instructed. You do not need to follow up with anesthesia unless specifically instructed to do so.  WOUND CARE INSTRUCTIONS (if applicable):  Keep a dry clean dressing on the anesthesia/puncture wound site if there is drainage.  Once the wound has quit draining you may leave it open to air.  Generally you should leave the bandage intact for twenty four hours unless there is drainage.  If the epidural site drains for more than 36-48 hours please call the anesthesia department.  QUESTIONS?:  Please feel free to call your physician or the hospital operator if you have any questions, and they will be happy to assist you.

## 2012-08-16 ENCOUNTER — Telehealth: Payer: Self-pay | Admitting: Nurse Practitioner

## 2012-08-16 NOTE — Telephone Encounter (Signed)
Please advise 

## 2012-08-19 NOTE — Telephone Encounter (Signed)
Please send letter for f/u.  

## 2012-08-20 ENCOUNTER — Ambulatory Visit (HOSPITAL_COMMUNITY): Payer: Medicare Other | Admitting: Anesthesiology

## 2012-08-20 ENCOUNTER — Encounter: Payer: Self-pay | Admitting: General Practice

## 2012-08-20 ENCOUNTER — Ambulatory Visit (HOSPITAL_COMMUNITY)
Admission: RE | Admit: 2012-08-20 | Discharge: 2012-08-20 | Disposition: A | Discharge status: Home / Self Care | Payer: Medicare Other | Source: Ambulatory Visit | Attending: Gastroenterology | Admitting: Gastroenterology

## 2012-08-20 ENCOUNTER — Encounter (HOSPITAL_COMMUNITY)
Admission: RE | Disposition: A | Discharge status: Home / Self Care | Payer: Self-pay | Source: Ambulatory Visit | Attending: Gastroenterology

## 2012-08-20 ENCOUNTER — Encounter (HOSPITAL_COMMUNITY): Payer: Self-pay | Admitting: *Deleted

## 2012-08-20 ENCOUNTER — Encounter (HOSPITAL_COMMUNITY): Payer: Self-pay | Admitting: Anesthesiology

## 2012-08-20 DIAGNOSIS — Z1211 Encounter for screening for malignant neoplasm of colon: Secondary | ICD-10-CM | POA: Insufficient documentation

## 2012-08-20 DIAGNOSIS — K648 Other hemorrhoids: Secondary | ICD-10-CM | POA: Insufficient documentation

## 2012-08-20 DIAGNOSIS — Z79899 Other long term (current) drug therapy: Secondary | ICD-10-CM | POA: Insufficient documentation

## 2012-08-20 DIAGNOSIS — Z800 Family history of malignant neoplasm of digestive organs: Secondary | ICD-10-CM

## 2012-08-20 DIAGNOSIS — I10 Essential (primary) hypertension: Secondary | ICD-10-CM | POA: Insufficient documentation

## 2012-08-20 DIAGNOSIS — Z01812 Encounter for preprocedural laboratory examination: Secondary | ICD-10-CM | POA: Insufficient documentation

## 2012-08-20 HISTORY — PX: COLONOSCOPY WITH PROPOFOL: SHX5780

## 2012-08-20 SURGERY — COLONOSCOPY WITH PROPOFOL
Anesthesia: Monitor Anesthesia Care

## 2012-08-20 MED ORDER — HYDRALAZINE HCL 20 MG/ML IJ SOLN
INTRAMUSCULAR | Status: DC | PRN
  Administered 2012-08-20 (×2): 5 mg via INTRAVENOUS

## 2012-08-20 MED ORDER — PROPOFOL 10 MG/ML IV EMUL
INTRAVENOUS | Status: AC
  Filled 2012-08-20: qty 40

## 2012-08-20 MED ORDER — LABETALOL HCL 5 MG/ML IV SOLN
INTRAVENOUS | Status: AC
  Filled 2012-08-20: qty 4

## 2012-08-20 MED ORDER — WATER FOR IRRIGATION, STERILE IR SOLN
Status: DC | PRN
  Administered 2012-08-20: 1000 mL

## 2012-08-20 MED ORDER — FENTANYL CITRATE 0.05 MG/ML IJ SOLN
INTRAMUSCULAR | Status: DC | PRN
  Administered 2012-08-20: 25 ug via INTRAVENOUS
  Administered 2012-08-20: 50 ug via INTRAVENOUS
  Administered 2012-08-20: 25 ug via INTRAVENOUS

## 2012-08-20 MED ORDER — ONDANSETRON HCL 4 MG/2ML IJ SOLN
INTRAMUSCULAR | Status: AC
  Filled 2012-08-20: qty 2

## 2012-08-20 MED ORDER — HYDRALAZINE HCL 20 MG/ML IJ SOLN
INTRAMUSCULAR | Status: AC
  Filled 2012-08-20: qty 1

## 2012-08-20 MED ORDER — MIDAZOLAM HCL 2 MG/2ML IJ SOLN
INTRAMUSCULAR | Status: AC
  Filled 2012-08-20: qty 2

## 2012-08-20 MED ORDER — MIDAZOLAM HCL 2 MG/2ML IJ SOLN
1.0000 mg | INTRAMUSCULAR | Status: AC | PRN
  Administered 2012-08-20 (×3): 2 mg via INTRAVENOUS

## 2012-08-20 MED ORDER — FENTANYL CITRATE 0.05 MG/ML IJ SOLN: 25.0000 ug | INTRAMUSCULAR | Status: DC | PRN

## 2012-08-20 MED ORDER — FENTANYL CITRATE 0.05 MG/ML IJ SOLN
INTRAMUSCULAR | Status: AC
  Filled 2012-08-20: qty 2

## 2012-08-20 MED ORDER — ONDANSETRON HCL 4 MG/2ML IJ SOLN
4.0000 mg | Freq: Once | INTRAMUSCULAR | Status: AC
  Administered 2012-08-20: 4 mg via INTRAVENOUS

## 2012-08-20 MED ORDER — PROPOFOL INFUSION 10 MG/ML OPTIME
INTRAVENOUS | Status: DC | PRN
  Administered 2012-08-20: 100 ug/kg/min via INTRAVENOUS
  Administered 2012-08-20: 125 ug/kg/min via INTRAVENOUS

## 2012-08-20 MED ORDER — LABETALOL HCL 5 MG/ML IV SOLN
10.0000 mg | INTRAVENOUS | Status: DC | PRN
  Administered 2012-08-20: 10 mg via INTRAVENOUS

## 2012-08-20 MED ORDER — GLYCOPYRROLATE 0.2 MG/ML IJ SOLN
INTRAMUSCULAR | Status: AC
  Filled 2012-08-20: qty 1

## 2012-08-20 MED ORDER — STERILE WATER FOR IRRIGATION IR SOLN
Status: DC | PRN
  Administered 2012-08-20: 10:00:00

## 2012-08-20 MED ORDER — LACTATED RINGERS IV SOLN
INTRAVENOUS | Status: DC
  Administered 2012-08-20: 09:00:00 via INTRAVENOUS

## 2012-08-20 MED ORDER — GLYCOPYRROLATE 0.2 MG/ML IJ SOLN
0.2000 mg | Freq: Once | INTRAMUSCULAR | Status: AC
  Administered 2012-08-20: 0.2 mg via INTRAVENOUS

## 2012-08-20 MED ORDER — ONDANSETRON HCL 4 MG/2ML IJ SOLN: 4.0000 mg | Freq: Once | INTRAMUSCULAR | Status: DC | PRN

## 2012-08-20 MED ORDER — LABETALOL HCL 5 MG/ML IV SOLN
10.0000 mg | Freq: Once | INTRAVENOUS | Status: AC
  Administered 2012-08-20: 10 mg via INTRAVENOUS

## 2012-08-20 SURGICAL SUPPLY — 13 items
ELECT REM PT RETURN 9FT ADLT (ELECTROSURGICAL)
ELECTRODE REM PT RTRN 9FT ADLT (ELECTROSURGICAL) IMPLANT
FLOOR PAD 36X40 (MISCELLANEOUS) ×2
FORCEPS BIOP RAD 4 LRG CAP 4 (CUTTING FORCEPS) IMPLANT
LUBRICANT JELLY 4.5OZ STERILE (MISCELLANEOUS) ×2 IMPLANT
MANIFOLD NEPTUNE II (INSTRUMENTS) ×2 IMPLANT
PAD FLOOR 36X40 (MISCELLANEOUS) ×1 IMPLANT
SNARE SHORT THROW 13M SML OVAL (MISCELLANEOUS) IMPLANT
SYR 50ML LL SCALE MARK (SYRINGE) ×2 IMPLANT
TRAP SPECIMEN MUCOUS 40CC (MISCELLANEOUS) IMPLANT
TUBING ENDO SMARTCAP PENTAX (MISCELLANEOUS) ×2 IMPLANT
TUBING IRRIGATION ENDOGATOR (MISCELLANEOUS) ×2 IMPLANT
WATER STERILE IRR 1000ML POUR (IV SOLUTION) ×2 IMPLANT

## 2012-08-20 NOTE — Anesthesia Postprocedure Evaluation (Signed)
Anesthesia Post Note  Patient: Robin Jensen  Procedure(s) Performed: Procedure(s) (LRB): COLONOSCOPY WITH PROPOFOL (N/A)  Anesthesia type: MAC  Patient location: PACU  Post pain: Pain level controlled  Post assessment: Post-op Vital signs reviewed, Patient's Cardiovascular Status Stable, Respiratory Function Stable, Patent Airway, No signs of Nausea or vomiting and Pain level controlled  Last Vitals:  Filed Vitals:   08/20/12 1006  BP: 109/52  Pulse: 95  Temp: 36.9 C  Resp: 19    Post vital signs: Reviewed and stable  Level of consciousness: awake and alert   Complications: No apparent anesthesia complications

## 2012-08-20 NOTE — Transfer of Care (Signed)
Immediate Anesthesia Transfer of Care Note  Patient: Robin Jensen  Procedure(s) Performed: Procedure(s) (LRB): COLONOSCOPY WITH PROPOFOL (N/A)  Patient Location: PACU  Anesthesia Type: MAC  Level of Consciousness: awake  Airway & Oxygen Therapy: Patient Spontanous Breathing.   Post-op Assessment: Report given to PACU RN, Post -op Vital signs reviewed and stable and Patient moving all extremities  Post vital signs: Reviewed and stable  Complications: No apparent anesthesia complications

## 2012-08-20 NOTE — H&P (Signed)
Primary Care Physician:  Rudi Heap, MD Primary Gastroenterologist:  Dr. Darrick Penna  Pre-Procedure History & Physical: HPI:  Robin Jensen is a 52 y.o. female here for COLON CANCER SCREENING.  Past Medical History  Diagnosis Date  . Cancer     uterine  . Hypertension   . Bipolar 1 disorder   . Hyperlipidemia   . GERD (gastroesophageal reflux disease)   . Hypothyroid   . Seizure disorder   . Migraine   . Anxiety   . Depression   . Suicidal ideation     attempted overdoses in past  . Stroke 1997    No deficits  . Seizures     last year last seizure-had brain aneurysm. ON meds  . DVT of leg (deep venous thrombosis)     history of    Past Surgical History  Procedure Laterality Date  . Radical hysterectomy    . Cholecystectomy    . Cerebral aneurysm repair  1997    Prior to Admission medications   Medication Sig Start Date End Date Taking? Authorizing Provider  ARIPiprazole (ABILIFY) 5 MG tablet Take 5 mg by mouth daily.   Yes Historical Provider, MD  atorvastatin (LIPITOR) 40 MG tablet Take 40 mg by mouth daily.   Yes Historical Provider, MD  diazepam (VALIUM) 5 MG tablet Take 5 mg by mouth 3 (three) times daily.   Yes Historical Provider, MD  furosemide (LASIX) 40 MG tablet Take 40 mg by mouth daily.   Yes Historical Provider, MD  labetalol (NORMODYNE) 200 MG tablet Take 200 mg by mouth 2 (two) times daily.   Yes Historical Provider, MD  levothyroxine (SYNTHROID, LEVOTHROID) 75 MCG tablet Take 75 mcg by mouth daily before breakfast.   Yes Historical Provider, MD  losartan (COZAAR) 50 MG tablet Take 1 tablet (50 mg total) by mouth daily. 08/14/12  Yes Mary-Margaret Daphine Deutscher, FNP  mirtazapine (REMERON) 30 MG tablet Take 30 mg by mouth at bedtime.   Yes Historical Provider, MD  phenytoin (DILANTIN) 100 MG ER capsule Take 200 mg by mouth 2 (two) times daily.   Yes Historical Provider, MD  polyethylene glycol powder (GLYCOLAX/MIRALAX) powder Take 17 g by mouth daily. 08/07/12   Yes Tiffany Kocher, PA-C  polyethylene glycol-electrolytes (TRILYTE) 420 G solution Take 4,000 mLs by mouth as directed. 08/07/12  Yes West Bali, MD  thiothixene (NAVANE) 10 MG capsule Take 20 mg by mouth at bedtime as needed.    Yes Historical Provider, MD  topiramate (TOPAMAX) 50 MG tablet Take 50 mg by mouth daily.   Yes Historical Provider, MD  trihexyphenidyl (ARTANE) 5 MG tablet Take 5 mg by mouth 2 (two) times daily with a meal.   Yes Historical Provider, MD  Verapamil HCl CR 300 MG CP24 Take 300 mg by mouth daily.   Yes Historical Provider, MD  zolpidem (AMBIEN) 10 MG tablet Take 10 mg by mouth at bedtime as needed for sleep.   Yes Historical Provider, MD   Allergies as of 08/07/2012 - Review Complete 08/07/2012  Allergen Reaction Noted  . Penicillins Shortness Of Breath and Rash 06/03/2010  . Latex  06/03/2010  . Sulfa antibiotics  06/03/2010   Family History  Problem Relation Age of Onset  . Colon cancer Father     less than age 30  . Colon cancer Paternal Uncle     88s  . Colon cancer Paternal Grandfather     History   Social History  . Marital Status: Divorced  Spouse Name: N/A    Number of Children: 2  . Years of Education: N/A   Occupational History  . Not on file.   Social History Main Topics  . Smoking status: Former Smoker    Quit date: 08/15/2005  . Smokeless tobacco: Not on file  . 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 Drug Use: No     Comment: H/O marijuana and cocaine, in 1980s  . Sexually Active: Yes    Birth Control/ Protection: Surgical   Other Topics Concern  . Not on file   Social History Narrative   ** Merged History Encounter **       Review of Systems: See HPI, otherwise negative ROS  Physical Exam: BP 193/125  Pulse 80  Temp(Src) 98 F (36.7 C) (Oral)  Resp 21  SpO2 93% General:   Alert,  pleasant and cooperative in NAD Head:  Normocephalic and atraumatic. Neck:  Supple; Lungs:  Clear throughout to  auscultation.    Heart:  Regular rate and rhythm. Abdomen:  Soft, nontender and nondistended. Normal bowel sounds, without guarding, and without rebound.   Neurologic:  Alert and  oriented x4;  grossly normal neurologically.  Impression/Plan:    FAMILY Hx COLON CA-FATHER HAD COLON CA AGE < 60.  Plan:  1. TCS TODAY

## 2012-08-20 NOTE — Anesthesia Preprocedure Evaluation (Signed)
Anesthesia Evaluation  Patient identified by MRN, date of birth, ID band Patient awake    Reviewed: Allergy & Precautions, H&P , NPO status , Patient's Chart, lab work & pertinent test results, reviewed documented beta blocker date and time   History of Anesthesia Complications Negative for: history of anesthetic complications  Airway Mallampati: III TM Distance: <3 FB Neck ROM: Limited    Dental  (+) Partial Lower, Missing, Poor Dentition and Chipped   Pulmonary former smoker,  breath sounds clear to auscultation        Cardiovascular hypertension, Pt. on medications Rhythm:Regular Rate:Normal     Neuro/Psych  Headaches, Seizures -, Well Controlled,  PSYCHIATRIC DISORDERS Anxiety Depression Cerebral aneurysm clipped CVA    GI/Hepatic GERD-  Medicated and Controlled,  Endo/Other  Hypothyroidism   Renal/GU      Musculoskeletal   Abdominal   Peds  Hematology   Anesthesia Other Findings   Reproductive/Obstetrics                           Anesthesia Physical Anesthesia Plan  ASA: III  Anesthesia Plan: MAC   Post-op Pain Management:    Induction: Intravenous  Airway Management Planned: Simple Face Mask  Additional Equipment:   Intra-op Plan:   Post-operative Plan:   Informed Consent: I have reviewed the patients History and Physical, chart, labs and discussed the procedure including the risks, benefits and alternatives for the proposed anesthesia with the patient or authorized representative who has indicated his/her understanding and acceptance.     Plan Discussed with:   Anesthesia Plan Comments:         Anesthesia Quick Evaluation

## 2012-08-20 NOTE — Anesthesia Procedure Notes (Signed)
Procedure Name: MAC Date/Time: 08/20/2012 9:25 AM Performed by: Franco Nones Pre-anesthesia Checklist: Patient identified, Emergency Drugs available, Suction available, Timeout performed and Patient being monitored Patient Re-evaluated:Patient Re-evaluated prior to inductionOxygen Delivery Method: Non-rebreather mask

## 2012-08-20 NOTE — Telephone Encounter (Signed)
LETTER MAILED

## 2012-08-20 NOTE — Op Note (Signed)
[facility] [address] [place], [address]   COLONOSCOPY PROCEDURE REPORT  PATIENT: Robin, Jensen  MR#: [id number] BIRTHDATE: 1961-01-30 , 51  yrs. old GENDER: Female ENDOSCOPIST: Jonette Eva, MD REFERRED XB:[id number] Christell Constant, M.D. PROCEDURE DATE:  08/20/2012 PROCEDURE:   Colonoscopy, screening INDICATIONS:Patient's immediate family history of colon cancer-FATHER AGE < 60. MEDICATIONS: MAC sedation, administered by CRNA  DESCRIPTION OF PROCEDURE:    Physical exam was performed.  Informed consent was obtained from the patient after explaining the benefits, risks, and alternatives to procedure.  The patient was connected to monitor and placed in left lateral position. Continuous oxygen was provided by nasal cannula and IV medicine administered through an indwelling cannula.  After administration of sedation and rectal exam, the patients rectum was intubated and the     colonoscope was advanced under direct visualization to the ileum.  The scope was removed slowly by carefully examining the color, texture, anatomy, and integrity mucosa on the way out.  The patient was recovered in endoscopy and discharged home in satisfactory condition.       COLON FINDINGS: The mucosa appeared normal in the terminal ileum.  , A normal appearing cecum, ileocecal valve, and appendiceal orifice were identified.  The ascending, hepatic flexure, transverse, splenic flexure, descending, sigmoid colon and rectum appeared unremarkable.  No polyps, DIVERTICULOSIS, or cancers were seen.  , and Moderate sized internal hemorrhoids were found.  PREP QUALITY: good. CECAL W/D TIME: 12 minutes  COMPLICATIONS: None  ENDOSCOPIC IMPRESSION: 1.   Normal mucosa in the terminal ileum 2.   Normal colon 3.   Moderate sized internal hemorrhoids   RECOMMENDATIONS: HIGH FIBER/LOW FAT DIET LOSE WEIGHT TCS IN 5 YEARS WITH PROPOFOL       _______________________________ eSignedJonette Eva, MD 08/20/2012 10:21 AM     PATIENT NAME:  Robin Jensen MR#: [id number]

## 2012-08-21 ENCOUNTER — Other Ambulatory Visit: Payer: Self-pay

## 2012-08-21 ENCOUNTER — Ambulatory Visit: Payer: Self-pay

## 2012-08-22 ENCOUNTER — Encounter (HOSPITAL_COMMUNITY): Payer: Self-pay | Admitting: Gastroenterology

## 2012-08-28 ENCOUNTER — Ambulatory Visit (INDEPENDENT_AMBULATORY_CARE_PROVIDER_SITE_OTHER): Payer: Medicare Other | Admitting: Pharmacist

## 2012-08-28 ENCOUNTER — Ambulatory Visit (INDEPENDENT_AMBULATORY_CARE_PROVIDER_SITE_OTHER): Payer: Medicare Other

## 2012-08-28 VITALS — Ht 61.25 in | Wt 225.0 lb

## 2012-08-28 DIAGNOSIS — Z13820 Encounter for screening for osteoporosis: Secondary | ICD-10-CM

## 2012-08-28 DIAGNOSIS — Z780 Asymptomatic menopausal state: Secondary | ICD-10-CM

## 2012-08-28 NOTE — Patient Instructions (Addendum)
Increase calcium intake to 1200mg  daiy (best to get through diet - see handout)  Increase weight bearing exercise    Fall Prevention and Home Safety Falls cause injuries and can affect all age groups. It is possible to use preventive measures to significantly decrease the likelihood of falls. There are many simple measures which can make your home safer and prevent falls. OUTDOORS  Repair cracks and edges of walkways and driveways.  Remove high doorway thresholds.  Trim shrubbery on the main path into your home.  Have good outside lighting.  Clear walkways of tools, rocks, debris, and clutter.  Check that handrails are not broken and are securely fastened. Both sides of steps should have handrails.  Have leaves, snow, and ice cleared regularly.  Use sand or salt on walkways during winter months.  In the garage, clean up grease or oil spills. BATHROOM  Install night lights.  Install grab bars by the toilet and in the tub and shower.  Use non-skid mats or decals in the tub or shower.  Place a plastic non-slip stool in the shower to sit on, if needed.  Keep floors dry and clean up all water on the floor immediately.  Remove soap buildup in the tub or shower on a regular basis.  Secure bath mats with non-slip, double-sided rug tape.  Remove throw rugs and tripping hazards from the floors. BEDROOMS  Install night lights.  Make sure a bedside light is easy to reach.  Do not use oversized bedding.  Keep a telephone by your bedside.  Have a firm chair with side arms to use for getting dressed.  Remove throw rugs and tripping hazards from the floor. KITCHEN  Keep handles on pots and pans turned toward the center of the stove. Use back burners when possible.  Clean up spills quickly and allow time for drying.  Avoid walking on wet floors.  Avoid hot utensils and knives.  Position shelves so they are not too high or low.  Place commonly used objects within  easy reach.  If necessary, use a sturdy step stool with a grab bar when reaching.  Keep electrical cables out of the way.  Do not use floor polish or wax that makes floors slippery. If you must use wax, use non-skid floor wax.  Remove throw rugs and tripping hazards from the floor. STAIRWAYS  Never leave objects on stairs.  Place handrails on both sides of stairways and use them. Fix any loose handrails. Make sure handrails on both sides of the stairways are as long as the stairs.  Check carpeting to make sure it is firmly attached along stairs. Make repairs to worn or loose carpet promptly.  Avoid placing throw rugs at the top or bottom of stairways, or properly secure the rug with carpet tape to prevent slippage. Get rid of throw rugs, if possible.  Have an electrician put in a light switch at the top and bottom of the stairs. OTHER FALL PREVENTION TIPS  Wear low-heel or rubber-soled shoes that are supportive and fit well. Wear closed toe shoes.  When using a stepladder, make sure it is fully opened and both spreaders are firmly locked. Do not climb a closed stepladder.  Add color or contrast paint or tape to grab bars and handrails in your home. Place contrasting color strips on first and last steps.  Learn and use mobility aids as needed. Install an electrical emergency response system.  Turn on lights to avoid dark areas. Replace light bulbs  that burn out immediately. Get light switches that glow.  Arrange furniture to create clear pathways. Keep furniture in the same place.  Firmly attach carpet with non-skid or double-sided tape.  Eliminate uneven floor surfaces.  Select a carpet pattern that does not visually hide the edge of steps.  Be aware of all pets. OTHER HOME SAFETY TIPS  Set the water temperature for 120 F (48.8 C).  Keep emergency numbers on or near the telephone.  Keep smoke detectors on every level of the home and near sleeping areas. Document  Released: 03/31/2002 Document Revised: 10/10/2011 Document Reviewed: 06/30/2011 [facility] Patient Information 2013 [place], [place].

## 2012-08-28 NOTE — Progress Notes (Signed)
Patient ID: Robin Jensen, female   DOB: 10-30-1960, 52 y.o.   MRN: [id number]  [facility] Current Height: Height: 5' 1.25" (155.6 cm)      Max Lifetime Height:  5' 1.25" Current Weight: Weight: 225 lb (102.059 kg)       Ethnicity:Caucasian   HPI: Does pt already have a diagnosis of:  Osteopenia?  No Osteoporosis?  No  Back Pain?  Yes       Kyphosis?  No Prior fracture?  No Med(s) for Osteoporosis/Osteopenia:  none Med(s) previously tried for Osteoporosis/Osteopenia:  none                                                             PMH: Age at menopause:  Surgical 1983 - partial hysterectomy Hysterectomy?  Yes Oophorectomy?  No HRT? No Steroid Use?  No Thyroid med?  Yes History of cancer?  Yes  - uterine cancer History of digestive disorders (ie Crohn's)?  Yes - chronic constipation Current or previous eating disorders?  Yes  - anorexia Last Vitamin D Result:  32 (02/2012) Last GFR Result:  61 (07/02/2012)   FH/SH: Family history of osteoporosis?  No Parent with history of hip fracture?  No Family history of breast cancer?  Yes -maternal aunt Exercise?  No Caffeine?  Yes  - soda Smoking?  No Alcohol?  No    Calcium Assessment Calcium Intake  # of servings/day  Calcium mg  Milk (8 oz) none  x  300  = 0  Yogurt (4 oz) none x  200 = 0  Cheese (1 oz) none x  200 = 0  Other Calcium sources   250mg   Ca supplement none = 0   Estimated calcium intake per day 250mg     DEXA Results Date of Test T-Score for AP Spine L1-L4 T-Score for neck of Left Hip T-Score for Neck of Right Hip  08/28/2012 +2.9 -0.3 -0.3                   Assessment: Normal BMD - on medication associated with high risk of osteoporosis - dilantin/phenytoin and levothyroxine  Low calcium intake  Recommendations:  1.  recommend calcium 1200mg  daily either through supplementation or diet.  If she uses supplementation suggested for calcium with magnesium (may help prevent constipation  associated with calcium supplements) and start with 1 tablet and increase as tolerated up to 2 tablets daily. 3.  recommend weight bearing exercise - 30 minutes at least 4 days per week.   4.  Counseled and educated about fall risk and prevention.  Recheck DEXA:  2 years  Time spent counseling patient:  15 minutes

## 2012-09-06 ENCOUNTER — Telehealth: Payer: Self-pay | Admitting: Nurse Practitioner

## 2012-09-06 DIAGNOSIS — B351 Tinea unguium: Secondary | ICD-10-CM

## 2012-09-06 NOTE — Telephone Encounter (Signed)
Need to know what appointment is for so I can put in computer

## 2012-09-06 NOTE — Telephone Encounter (Signed)
Toenails turned and growing the wrong way- needs podiatrist to fix the problem Please send in referral

## 2012-09-06 NOTE — Telephone Encounter (Signed)
referral done.

## 2012-09-07 NOTE — Telephone Encounter (Signed)
pt aware referral done

## 2012-09-24 ENCOUNTER — Other Ambulatory Visit: Payer: Self-pay

## 2012-09-24 MED ORDER — LABETALOL HCL 200 MG PO TABS: ORAL_TABLET | ORAL | Status: DC

## 2012-09-27 ENCOUNTER — Encounter: Payer: Self-pay | Admitting: Family Medicine

## 2012-09-27 ENCOUNTER — Ambulatory Visit (INDEPENDENT_AMBULATORY_CARE_PROVIDER_SITE_OTHER): Payer: Medicare Other | Admitting: Family Medicine

## 2012-09-27 VITALS — BP 145/103 | HR 74 | Temp 97.9°F | Ht 61.25 in | Wt 227.8 lb

## 2012-09-27 DIAGNOSIS — R0789 Other chest pain: Secondary | ICD-10-CM

## 2012-09-27 DIAGNOSIS — R350 Frequency of micturition: Secondary | ICD-10-CM

## 2012-09-27 LAB — POCT UA - MICROSCOPIC ONLY
Crystals, Ur, HPF, POC: NEGATIVE
Yeast, UA: NEGATIVE

## 2012-09-27 LAB — POCT URINALYSIS DIPSTICK
Blood, UA: NEGATIVE
Ketones, UA: NEGATIVE
Protein, UA: NEGATIVE
Spec Grav, UA: 1.01

## 2012-09-27 MED ORDER — TOLTERODINE TARTRATE ER 2 MG PO CP24: 2.0000 mg | ORAL_CAPSULE | Freq: Every day | ORAL | Status: DC

## 2012-09-27 MED ORDER — LOSARTAN POTASSIUM 100 MG PO TABS: 100.0000 mg | ORAL_TABLET | Freq: Every day | ORAL | Status: DC

## 2012-09-27 NOTE — Patient Instructions (Addendum)
Urinary Frequency °The number of times a normal person urinates depends upon how much liquid they take in and how much liquid they are losing. If the temperature is hot and there is high humidity then the person will sweat more and usually breathe a little more frequently. These factors decrease the amount of frequency of urination that would be considered normal. °The amount you drink is easily determined, but the amount of fluid lost is sometimes more difficult to calculate.  °Fluid is lost in two ways: °· Sensible fluid loss is usually measured by the amount of urine that you get rid of. Losses of fluid can also occur with diarrhea. °· Insensible fluid loss is more difficult to measure. It is caused by evaporation. Insensible loss of fluid occurs through breathing and sweating. It usually ranges from a little less than a quart to a little more than a quart of fluid a day. °In normal temperatures and activity levels the average person may urinate 4 to 7 times in a 24-hour period. Needing to urinate more often than that could indicate a problem. If one urinates 4 to 7 times in 24 hours and has large volumes each time, that could indicate a different problem from one who urinates 4 to 7 times a day and has small volumes. The time of urinating is also an important. Most urinating should be done during the waking hours. Getting up at night to urinate frequently can indicate some problems. °CAUSES  °The bladder is the organ in your lower abdomen that holds urine. Like a balloon, it swells some as it fills up. Your nerves sense this and tell you it is time to head for the bathroom. There are a number of reasons that you might feel the need to urinate more often than usual. They include: °· Urinary tract infection. This is usually associated with other signs such as burning when you urinate. °· In men, problems with the prostate (a walnut-size gland that is located near the tube that carries urine out of your body).  There are two reasons why the prostate can cause an increased frequency of urination: °· An enlarged prostate that does not let the bladder empty well. If the bladder only half empties when you urinate then it only has half the capacity to fill before you have to urinate again. °· The nerves in the bladder become more hypersensitive with an increased size of the prostate even if the bladder empties completely. °· Pregnancy. °· Obesity. Excess weight is more likely to cause a problem for women more than for men. °· Bladder stones or other bladder problems. °· Caffeine. °· Alcohol. °· Medications. For example, drugs that help the body get rid of extra fluid (diuretics) increase urine production. Some other medicines must be taken with lots of fluids. °· Muscle or nerve weakness. This might be the result of a spinal cord injury, a stroke, multiple sclerosis or Parkinson's disease. °· Long-standing diabetes can decrease the sensation of the bladder. This loss of sensation makes it harder to sense the bladder needs to be emptied. Over a period of years the bladder is stretched out by constant overfilling. This weakens the bladder muscles so that the bladder does not empty well and has less capacity to fill with new urine. °· Interstitial cystitis (also called painful bladder syndrome). This condition develops because the tissues that line the insider of the bladder are inflamed (inflammation is the body's way of reacting to injury or infection). It causes pain   and frequent urination. It occurs in women more often than in men. °DIAGNOSIS  °· To decide what might be causing your urinary frequency, your healthcare provider will probably: °· Ask about symptoms you have noticed. °· Ask about your overall health. This will include questions about any medications you are taking. °· Do a physical examination. °· Order some tests. These might include: °· A blood test to check for diabetes or other health issues that could be  contributing to the problem. °· Urine testing. This could measure the flow of urine and the pressure on the bladder. °· A test of your neurological system (the brain, spinal cord and nerves). This is the system that senses the need to urinate. °· A bladder test to check whether it is emptying completely when you urinate. °· Cytoscopy. This test uses a thin tube with a tiny camera on it. It offers a look inside your urethra and bladder to see if there are problems. °· Imaging tests. You might be given a contrast dye and then asked to urinate. X-rays are taken to see how your bladder is working. °TREATMENT  °It is important for you to be evaluated to determine if the amount or frequency that you have is unusual or abnormal. If it is found to be abnormal the cause should be determined and this can usually be found out easily. Depending upon the cause treatment could include medication, stimulation of the nerves, or surgery. °There are not too many things that you can do as an individual to change your urinary frequency. It is important that you balance the amount of fluid intake needed to compensate for your activity and the temperature. Medical problems will be diagnosed and taken care of by your physician. There is no particular bladder training such as Kegel's exercises that you can do to help urinary frequency. This is an exercise this is usually done for people who have leaking of urine when they laugh cough or sneeze. °HOME CARE INSTRUCTIONS  °· Take any medications your healthcare provider prescribed or suggested. Follow the directions carefully. °· Practice any lifestyle changes that are recommended. These might include: °· Drinking less fluid or drinking at different times of the day. If you need to urinate often during the night, for example, you may need to stop drinking fluids early in the evening. °· Cutting down on caffeine or alcohol. They both can make you need to urinate more often than normal.  Caffeine is found in coffee, tea and sodas. °· Losing weight, if that is recommended. °· Keep a journal or a log. You might be asked to record how much you drink and when and when you feel the need to urinate. This will also help evaluate how well the treatment provided by your physician is working. °SEEK MEDICAL CARE IF:  °· Your need to urinate often gets worse. °· You feel increased pain or irritation when you urinate. °· You notice blood in your urine. °· You have questions about any medications that your healthcare provider recommended. °· You notice blood, pus or swelling at the site of any test or treatment procedure. °· You develop a fever of more than 100.5° F (38.1° C). °SEEK IMMEDIATE MEDICAL CARE IF:  °You develop a fever of more than 102.0° F (38.9° C). °Document Released: 02/04/2009 Document Revised: 07/03/2011 Document Reviewed: 02/04/2009 °[employer]® Patient Information ©2014 [employer]. ° °

## 2012-09-27 NOTE — Progress Notes (Signed)
Subjective:    Patient ID: Robin Jensen, female    DOB: 1961/04/14, 52 y.o.   MRN: [id number]  Urinary Frequency  This is a chronic problem. The current episode started in the past 7 days. The problem occurs every urination. The problem has been unchanged. The quality of the pain is described as burning. The pain is mild. There has been no fever. Associated symptoms include frequency and urgency. She has tried nothing for the symptoms.  Shortness of Breath This is a chronic problem. The current episode started more than 1 year ago. The problem occurs constantly. The problem has been unchanged. Associated symptoms include chest pain, headaches, leg pain, leg swelling, orthopnea, PND, a sore throat and syncope. Pertinent negatives include no claudication. The symptoms are aggravated by any activity, exercise, lying flat, emotional upset and pollens. She has tried nothing for the symptoms. Her past medical history is significant for DVT.  Hypertension This is a chronic problem. The current episode started more than 1 year ago. The problem has been gradually improving since onset. The problem is uncontrolled. Associated symptoms include anxiety, chest pain, headaches, malaise/fatigue, orthopnea, peripheral edema, PND and shortness of breath. There are no associated agents to hypertension. Risk factors for coronary artery disease include dyslipidemia, obesity and stress. Past treatments include angiotensin blockers, beta blockers, calcium channel blockers and diuretics. The current treatment provides no improvement. There are no compliance problems.    Snoring - Patient states she was told she needs sleep study due to excessive snoring when she was hospitalized for her depression and suicide attempt.  She did not follow up for this.  She was at [facility] and states they were going to order this.  She c/o fatigue and insomnia.  She has uncontrolled hypertension.   Review of Systems   Constitutional: Positive for malaise/fatigue.  HENT: Positive for sore throat.   Respiratory: Positive for shortness of breath.   Cardiovascular: Positive for chest pain, orthopnea, leg swelling, syncope and PND. Negative for claudication.  Genitourinary: Positive for urgency and frequency.  Neurological: Positive for headaches.       Objective:   Physical Exam  Constitutional: She appears well-developed and well-nourished.  HENT:  Head: Normocephalic and atraumatic.  Eyes: Pupils are equal, round, and reactive to light.  Neck: Normal range of motion.  Cardiovascular: Normal rate, regular rhythm and normal heart sounds.   Abdominal: Soft. There is tenderness.   Results for orders placed in visit on 09/27/12  POCT URINALYSIS DIPSTICK      Result Value Range   Color, UA straw     Clarity, UA clear     Glucose, UA neg     Bilirubin, UA neg     Ketones, UA neg     Spec Grav, UA 1.010     Blood, UA neg     pH, UA 7.5     Protein, UA neg     Urobilinogen, UA negative     Nitrite, UA neg     Leukocytes, UA Negative    POCT UA - MICROSCOPIC ONLY      Result Value Range   WBC, Ur, HPF, POC occ     RBC, urine, microscopic occ     Bacteria, U Microscopic few     Mucus, UA trace     Epithelial cells, urine per micros occ     Crystals, Ur, HPF, POC neg     Casts, Ur, LPF, POC neg     Yeast, UA  neg          Assessment & Plan:  Frequent urination - Plan: POCT urinalysis dipstick, POCT UA - Microscopic Only, tolterodine (DETROL LA) 2 MG 24 hr capsule  Other chest pain - Plan: Ambulatory referral to Cardiology  Hypertension - Increase cozaar to 100mg  po qd and follow up in 2 weeks.  Shortness of Breath - Multifactorial, since she has c/o occasional chest pain will r/o cardiac etiology. Discuss at follow up.  Recommend weight loss.  Insomnia - Continue ambien, discussed she keep a sleep diary, her snoring may by due to OSAS and may require sleep study.  Discuss at next  visit.  Psychiatric Illness - Continue to follow up with psychiatry.

## 2012-10-03 ENCOUNTER — Other Ambulatory Visit: Payer: Self-pay | Admitting: Family Medicine

## 2012-10-03 ENCOUNTER — Telehealth: Payer: Self-pay | Admitting: Family Medicine

## 2012-10-03 ENCOUNTER — Other Ambulatory Visit: Payer: Self-pay | Admitting: *Deleted

## 2012-10-03 MED ORDER — LEVOTHYROXINE SODIUM 75 MCG PO TABS: 75.0000 ug | ORAL_TABLET | Freq: Every day | ORAL | Status: AC

## 2012-10-03 MED ORDER — VERAPAMIL HCL ER 300 MG PO CP24: 300.0000 mg | ORAL_CAPSULE | Freq: Every day | ORAL | Status: DC

## 2012-10-03 MED ORDER — LOSARTAN POTASSIUM-HCTZ 100-25 MG PO TABS: 1.0000 | ORAL_TABLET | Freq: Every day | ORAL | Status: DC

## 2012-10-03 MED ORDER — LEVOTHYROXINE SODIUM 75 MCG PO TABS: 75.0000 ug | ORAL_TABLET | Freq: Every day | ORAL | Status: DC

## 2012-10-03 NOTE — Telephone Encounter (Signed)
New bp med sent to pharmacy

## 2012-10-03 NOTE — Telephone Encounter (Signed)
Patient aware.

## 2012-10-11 ENCOUNTER — Ambulatory Visit (INDEPENDENT_AMBULATORY_CARE_PROVIDER_SITE_OTHER): Payer: Medicare Other | Admitting: Family Medicine

## 2012-10-11 VITALS — BP 135/99 | HR 67 | Temp 97.6°F | Wt 227.4 lb

## 2012-10-11 DIAGNOSIS — R531 Weakness: Secondary | ICD-10-CM

## 2012-10-11 DIAGNOSIS — R079 Chest pain, unspecified: Secondary | ICD-10-CM

## 2012-10-11 DIAGNOSIS — R5383 Other fatigue: Secondary | ICD-10-CM

## 2012-10-11 DIAGNOSIS — I10 Essential (primary) hypertension: Secondary | ICD-10-CM

## 2012-10-11 DIAGNOSIS — R471 Dysarthria and anarthria: Secondary | ICD-10-CM

## 2012-10-11 DIAGNOSIS — R5381 Other malaise: Secondary | ICD-10-CM

## 2012-10-11 NOTE — Progress Notes (Signed)
  Subjective:    Patient ID: Robin Jensen, female    DOB: October 04, 1960, 52 y.o.   MRN: [id number]  HPI This 52 y.o. female presents for evaluation of hypertension.  Patient is here for follow up on her hypertension.  She had her losartan 100mg  increased a few days ago to losartan HCT 100/25 due to her elevated bp in her psychiatrist office.  She has been having chest pain on and off and she has been referred to Cardiology for chest pains. She states that yesterday she developed left sided chest pain and developed left upper and lower extremity numbness.  She states she also has had difficulty with her speech and this developed along with her sx's yesterday and she has this problem at present and her speech is stuttered during interview.      Review of Systems Complains chest pain, SOB, and left sided weakness and numbness  Denies HA, dizziness, vision change, N/V, diarrhea, constipation, dysuria, urinary urgency or frequency, myalgias, arthralgias or rash.      Objective:   Physical Exam Vital signs noted  Well developed well nourished  female.  HEENT - Head atraumatic Normocephalic                Eyes - PERRLA, Conjuctiva - clear Sclera- Clear EOMI                Ears - EAC's Wnl TM's Wnl Gross Hearing WNL                Nose - Nares patent                 Throat - oropharanx wnl Respiratory - Lungs CTA bilateral Cardiac - RRR S1 and S2 without murmur GI - Abdomen soft Nontender and bowel sounds active x 4 Extremities - No edema. Neuro - Speech dysarthric, face asymmetry with smile, left side droops, weak left grip.        Assessment & Plan:  Unspecified essential - Doing better, recommend follow up after hospital, continue current regimen.  Chest pain - Recommend go to ED via EMS and she wants to go via POV.  Dysarthria - Could be having a stroke and recommend she go to ED via EMS and she refuses and wants to go via POV and will call family to take her to hospital.     Weakness - Could be having a stroke and recommended she go to ED ASAP.  She is refusing a transport via Ems and is going to go via POV and is calling her family now.

## 2012-10-11 NOTE — Patient Instructions (Signed)
Ischemic Stroke A stroke (cerebrovascular accident) is the sudden death of brain tissue. It is a medical emergency. A stroke can cause permanent loss of brain function. This can cause problems with different parts of your body. A transient ischemic attack (TIA) is different because it does not cause permanent damage. A TIA is a short-lived problem of poor blood flow affecting a part of the brain. A TIA is also a serious problem because having a TIA greatly increases the chances of having a stroke. When symptoms first develop, you cannot know if the problem might be a stroke or TIA. CAUSES  A stroke is caused by a decrease of oxygen supply to an area of your brain. It is usually the result of a small blood clot or collection of cholesterol or fat (plaque) that blocks blood flow in the brain. A stroke can also be caused by blocked or damaged carotid arteries.  RISK FACTORS  High blood pressure (hypertension).  High cholesterol.  Diabetes mellitus.  Heart disease.  The build up of plaque in the blood vessels (peripheral artery disease or atherosclerosis).  The build up of plaque in the blood vessels providing blood and oxygen to the brain (carotid artery stenosis).  An abnormal heart rhythm (atrial fibrillation).  Obesity.  Smoking.  Taking oral contraceptives (especially in combination with smoking).  Physical inactivity.  A diet high in fats, salt (sodium), and calories.  Alcohol use.  Use of illegal drugs (especially cocaine and methamphetamine).  Being female.  Being African American.  Being over the age of 55.  Family history of stroke.  Previous history of blood clots, stroke, TIA, or heart attack.  Sickle cell disease. SYMPTOMS  These symptoms usually develop suddenly, or may be newly present upon awakening from sleep:  Sudden weakness or numbness of the face, arm, or leg, especially on one side of the body.  Sudden trouble walking or difficulty moving arms or  legs.  Sudden confusion.  Sudden personality changes.  Trouble speaking (aphasia) or understanding.  Difficulty swallowing.  Sudden trouble seeing in one or both eyes.  Double vision.  Dizziness.  Loss of balance or coordination.  Sudden severe headache with no known cause.  Trouble reading or writing. DIAGNOSIS  Your caregiver can often determine the presence or absence of a stroke based on your symptoms, history, and physical exam. Computed tomography (CT) of the brain is usually performed to confirm the stroke, determine causes, and determine stroke severity. Other tests may be done to find the cause of the stroke. These tests may include:  Electrocardiography.  Continuous heart monitoring.  Echocardiography.  Carotid ultrasonography.  Magnetic resonance imaging (MRI).  A scan of the brain circulation.  Blood tests. PREVENTION  The risk of a stroke can be decreased by appropriately treating high blood pressure, high cholesterol, diabetes, heart disease, and obesity and by quitting smoking, limiting alcohol, and staying physically active. TREATMENT  Time is of the essence. It is important to seek treatment within 3 4 hours of the start of symptoms because you may receive a medicine to dissolve the clot (thrombolytic) that cannot be given after that time. Even if you do not know when your symptoms began, get treatment as soon as possible. After the 4 hour window has passed, treatment may include rest, oxygen, intravenous (IV) fluids, and medicines to thin the blood (anticoagulants). Treatment of stroke depends on the duration, severity, and cause of your symptoms. Medicines and diet may be used to address diabetes, high blood pressure,   and other risk factors. Physical, speech, and occupational therapists will assess you and work to improve any functions impaired by the stroke. Measures will be taken to prevent short-term and long-term complications, including infection from  breathing foreign material into the lungs (aspiration pneumonia), blood clots in the legs, bedsores, and falls. Rarely, surgery may be needed to remove large blood clots or to open up blocked arteries. HOME CARE INSTRUCTIONS   Take all medicines prescribed by your caregiver. Follow the directions carefully. Medicines may be used to control risk factors for a stroke. Be sure you understand all your medicine instructions.  You may be told to take aspirin or the anticoagulant warfarin. Warfarin needs to be taken exactly as instructed.  Too much and too little warfarin are both dangerous. Too much warfarin increases the risk of bleeding. Too little warfarin continues to allow the risk for blood clots. While taking warfarin, you will need to have regular blood tests to measure your blood clotting time. These blood tests usually include both the PT and INR tests. The PT and INR results allow your caregiver to adjust your dose of warfarin. The dose can change for many reasons. It is critically important that you take warfarin exactly as prescribed, and that you have your PT and INR levels drawn exactly as directed.  Many foods, especially foods high in vitamin K can interfere with warfarin and affect the PT and INR results. Foods high in vitamin K include spinach, kale, broccoli, cabbage, collard and turnip greens, brussels sprouts, peas, cauliflower, seaweed, and parsley as well as beef and pork liver, green tea, and soybean oil. You should eat a consistent amount of foods high in vitamin K. Avoid major changes in your diet, or notify your caregiver before changing your diet. Arrange a visit with a dietitian to answer your questions.  Many medicines can interfere with warfarin and affect the PT and INR results. You must tell your caregiver about any and all medicines you take, this includes all vitamins and supplements. Be especially cautious with aspirin and anti-inflammatory medicines. Do not take or  discontinue any prescribed or over-the-counter medicine except on the advice of your caregiver or pharmacist.  Warfarin can have side effects, such as excessive bruising or bleeding. You will need to hold pressure over cuts for longer than usual. Your caregiver or pharmacist will discuss other potential side effects.  Avoid sports or activities that may cause injury or bleeding.  Be mindful when shaving, flossing your teeth, or handling sharp objects.  Alcohol can change the body's ability to handle warfarin. It is best to avoid alcoholic drinks or consume only very small amounts while taking warfarin. Notify your caregiver if you change your alcohol intake.  Notify your dentist or other caregivers before procedures.  If swallow studies have determined that your swallowing reflex is present, you should eat healthy foods. A diet that includes 5 or more servings of fruits and vegetables a day may reduce the risk of stroke. Foods may need to be a special consistency (soft or pureed), or small bites may need to be taken in order to avoid aspirating or choking. Certain diets may be prescribed to address high blood pressure, high cholesterol, diabetes, or obesity.  A low-sodium, low-saturated fat, low-trans fat, low-cholesterol diet is recommended to manage high blood pressure.  A low-saturated fat, low-trans fat, low-cholesterol, and high-fiber diet may control cholesterol levels.  A controlled-carbohydrate, controlled-sugar diet is recommended to manage diabetes.  A reduced-calorie, low-sodium, low-saturated fat, low-trans   fat, low-cholesterol diet is recommended to manage obesity.  Maintain a healthy weight.  Stay physically active. It is recommended that you get at least 30 minutes of activity on most or all days.  Do not smoke.  Limit alcohol use even if you are not taking warfarin. Moderate alcohol use is considered to be:  No more than 2 drinks each day for men.  No more than 1 drink  each day for nonpregnant women.  Stop drug abuse.  Home safety. A safe home environment is important to reduce the risk of falls. Your caregiver may arrange for specialists to evaluate your home. Having grab bars in the bedroom and bathroom is often important. Your caregiver may arrange for equipment to be used at home, such as raised toilets and a seat for the shower.  Physical, occupational, and speech therapy. Ongoing therapy may be needed to maximize your recovery after a stroke. If you have been advised to use a walker or a cane, use it at all times. Be sure to keep your therapy appointments.  Follow all instructions for follow-up with your caregiver. This is very important. This includes any referrals, physical therapy, rehabilitation, and lab tests. Proper follow up can prevent another stroke from occurring. SEEK MEDICAL CARE IF:  You have personality changes.  You have difficulty swallowing.  You are seeing double.  You have dizziness.  You have a fever.  You have skin breakdown. SEEK IMMEDIATE MEDICAL CARE IF:  Any of these symptoms may represent a serious problem that is an emergency. Do not wait to see if the symptoms will go away. Get medical help right away. Call your local emergency services (911 in [place] Do not drive yourself to the hospital.  You have sudden weakness or numbness of the face, arm, or leg, especially on one side of the body.  You have sudden trouble walking or difficulty moving arms or legs.  You have sudden confusion.  You have trouble speaking (aphasia) or understanding.  You have sudden trouble seeing in one or both eyes.  You have a loss of balance or coordination.  You have a sudden, severe headache with no known cause.  You have new chest pain or an irregular heartbeat.  You have a partial or total loss of consciousness.   Document Released: 04/10/2005 Document Revised: 03/27/2012 Document Reviewed: 11/19/2011 [employer] Patient  Information 2014 [employer].  

## 2012-10-23 ENCOUNTER — Ambulatory Visit: Payer: Medicare Other | Admitting: Cardiology

## 2012-10-28 ENCOUNTER — Other Ambulatory Visit: Payer: Self-pay

## 2012-10-28 MED ORDER — FUROSEMIDE 40 MG PO TABS: 40.0000 mg | ORAL_TABLET | Freq: Every day | ORAL | Status: DC

## 2012-11-06 NOTE — Progress Notes (Signed)
REVIEWED.  APR 2014 [facility]

## 2012-12-10 ENCOUNTER — Telehealth: Payer: Self-pay | Admitting: Family Medicine

## 2012-12-10 NOTE — Telephone Encounter (Signed)
APPT MADE

## 2012-12-13 ENCOUNTER — Ambulatory Visit (INDEPENDENT_AMBULATORY_CARE_PROVIDER_SITE_OTHER): Payer: Medicare Other | Admitting: Family Medicine

## 2012-12-13 ENCOUNTER — Encounter: Payer: Self-pay | Admitting: Family Medicine

## 2012-12-13 VITALS — BP 109/75 | HR 81 | Temp 96.8°F | Ht 61.0 in | Wt 220.6 lb

## 2012-12-13 DIAGNOSIS — L299 Pruritus, unspecified: Secondary | ICD-10-CM

## 2012-12-13 DIAGNOSIS — R609 Edema, unspecified: Secondary | ICD-10-CM

## 2012-12-13 MED ORDER — HYDROXYZINE HCL 25 MG PO TABS: 25.0000 mg | ORAL_TABLET | Freq: Three times a day (TID) | ORAL | Status: DC | PRN

## 2012-12-13 MED ORDER — METOLAZONE 2.5 MG PO TABS: ORAL_TABLET | ORAL | Status: DC

## 2012-12-13 NOTE — Progress Notes (Signed)
  Subjective:    Patient ID: Robin Jensen, female    DOB: May 22, 1960, 52 y.o.   MRN: [id number]  HPI This 52 y.o. female presents for evaluation of swelling in her lower extremities.  She is accompanied By a friend.  She states she has started on prozac which is being rx'd by her psychiatrist.  She c/o itching And has been scratching and itching her forearms which is not new.   Review of Systems C/o edema and pruritis No chest pain, SOB, HA, dizziness, vision change, N/V, diarrhea, constipation, dysuria, urinary urgency or frequency, myalgias, arthralgias or rash.     Objective:   Physical Exam Vital signs noted  Well developed well nourished anxious female.  HEENT - Head atraumatic Normocephalic                Eyes - PERRLA, Conjuctiva - clear Sclera- Clear EOMI                Ears - EAC's Wnl TM's Wnl Gross Hearing WNL                Nose - Nares patent                 Throat - oropharanx wnl Respiratory - Lungs CTA bilateral Cardiac - RRR S1 and S2 without murmur GI - Abdomen soft Nontender and bowel sounds active x 4 Extremities - Pretibial edema 2 plus bilateral Neuro - Grossly intact. 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      Assessment & Plan:  Edema - Plan: metolazone (ZAROXOLYN) 2.5 MG tablet one half hour prior to am lasix for 2-3 days prn swelling. Keep legs elevated and follow up next week.  Itch - Plan: hydrOXYzine (ATARAX/VISTARIL) 25 MG tablet, instructed patient to try to avoid picking and scratching at  Arms.

## 2012-12-13 NOTE — Patient Instructions (Signed)
Edema  Edema is an abnormal build-up of fluids in tissues. Because this is partly dependent on gravity (water flows to the lowest place), it is more common in the legs and thighs (lower extremities). It is also common in the looser tissues, like around the eyes. Painless swelling of the feet and ankles is common and increases as a person ages. It may affect both legs and may include the calves or even thighs. When squeezed, the fluid may move out of the affected area and may leave a dent for a few moments.  CAUSES    Prolonged standing or sitting in one place for extended periods of time. Movement helps pump tissue fluid into the veins, and absence of movement prevents this, resulting in edema.   Varicose veins. The valves in the veins do not work as well as they should. This causes fluid to leak into the tissues.   Fluid and salt overload.   Injury, burn, or surgery to the leg, ankle, or foot, may damage veins and allow fluid to leak out.   Sunburn damages vessels. Leaky vessels allow fluid to go out into the sunburned tissues.   Allergies (from insect bites or stings, medications or chemicals) cause swelling by allowing vessels to become leaky.   Protein in the blood helps keep fluid in your vessels. Low protein, as in malnutrition, allows fluid to leak out.   Hormonal changes, including pregnancy and menstruation, cause fluid retention. This fluid may leak out of vessels and cause edema.   Medications that cause fluid retention. Examples are sex hormones, blood pressure medications, steroid treatment, or anti-depressants.   Some illnesses cause edema, especially heart failure, kidney disease, or liver disease.   Surgery that cuts veins or lymph nodes, such as surgery done for the heart or for breast cancer, may result in edema.  DIAGNOSIS   Your caregiver is usually easily able to determine what is causing your swelling (edema) by simply asking what is wrong (getting a history) and examining you (doing  a physical). Sometimes x-rays, EKG (electrocardiogram or heart tracing), and blood work may be done to evaluate for underlying medical illness.  TREATMENT   General treatment includes:   Leg elevation (or elevation of the affected body part).   Restriction of fluid intake.   Prevention of fluid overload.   Compression of the affected body part. Compression with elastic bandages or support stockings squeezes the tissues, preventing fluid from entering and forcing it back into the blood vessels.   Diuretics (also called water pills or fluid pills) pull fluid out of your body in the form of increased urination. These are effective in reducing the swelling, but can have side effects and must be used only under your caregiver's supervision. Diuretics are appropriate only for some types of edema.  The specific treatment can be directed at any underlying causes discovered. Heart, liver, or kidney disease should be treated appropriately.  HOME CARE INSTRUCTIONS    Elevate the legs (or affected body part) above the level of the heart, while lying down.   Avoid sitting or standing still for prolonged periods of time.   Avoid putting anything directly under the knees when lying down, and do not wear constricting clothing or garters on the upper legs.   Exercising the legs causes the fluid to work back into the veins and lymphatic channels. This may help the swelling go down.   The pressure applied by elastic bandages or support stockings can help reduce ankle swelling.     A low-salt diet may help reduce fluid retention and decrease the ankle swelling.   Take any medications exactly as prescribed.  SEEK MEDICAL CARE IF:   Your edema is not responding to recommended treatments.  SEEK IMMEDIATE MEDICAL CARE IF:    You develop shortness of breath or chest pain.   You cannot breathe when you lay down; or if, while lying down, you have to get up and go to the window to get your breath.   You are having increasing  swelling without relief from treatment.   You develop a fever over 102 F (38.9 C).   You develop pain or redness in the areas that are swollen.   Tell your caregiver right away if you have gained 3 lb/1.4 kg in 1 day or 5 lb/2.3 kg in a week.  MAKE SURE YOU:    Understand these instructions.   Will watch your condition.   Will get help right away if you are not doing well or get worse.  Document Released: 04/10/2005 Document Revised: 10/10/2011 Document Reviewed: 11/27/2007  [employer] Patient Information 2014 [employer].

## 2012-12-18 ENCOUNTER — Ambulatory Visit (INDEPENDENT_AMBULATORY_CARE_PROVIDER_SITE_OTHER): Payer: Medicare Other | Admitting: Cardiology

## 2012-12-18 ENCOUNTER — Encounter: Payer: Self-pay | Admitting: Cardiology

## 2012-12-18 VITALS — BP 98/60 | HR 74 | Ht 61.0 in | Wt 216.0 lb

## 2012-12-18 DIAGNOSIS — R079 Chest pain, unspecified: Secondary | ICD-10-CM

## 2012-12-18 MED ORDER — LOSARTAN POTASSIUM 50 MG PO TABS: 50.0000 mg | ORAL_TABLET | Freq: Every day | ORAL | Status: DC

## 2012-12-18 NOTE — Patient Instructions (Addendum)
Decrease losartan to 50 mg a day. Continue all other medications as listed.  Your physician has requested that you have a dobutamine echocardiogram. For further information please visit [email or web address] Please follow instruction sheet as given.  Follow up will be based on these results.

## 2012-12-18 NOTE — Progress Notes (Signed)
HPI The patient has no prior cardiac history. She was seen by our practice in 2012 with atypical chest pain. She had a negative stress perfusion study at that time.  She presents now with chest discomfort has been going on for about a month.  She says that it is happening multiple times a day.  It is a sharp discomfort in the left side. It happens at rest and goes away spontaneously. She did get short of breath today with this and at times has gotten nauseated. She thinks this is the same as the discomfort in 2012. However, this discomfort went away for a long time. She does a little  Light housework and does not bring on the symptoms with this. She does get dyspneic with exertion. She doesn't describe PND or orthopnea. She doesn't describe palpitations, for syncope or syncope. She does think the discomfort is getting slightly more pronounced.  Allergies  Allergen Reactions  . Penicillins Shortness Of Breath and Rash  . Latex   . Sulfa Antibiotics     Current Outpatient Prescriptions  Medication Sig Dispense Refill  . ARIPiprazole (ABILIFY) 5 MG tablet Take 5 mg by mouth daily.      Marland Kitchen atorvastatin (LIPITOR) 40 MG tablet Take 40 mg by mouth daily.      . diazepam (VALIUM) 5 MG tablet Take 5 mg by mouth 3 (three) times daily.      Marland Kitchen FLUoxetine (PROZAC) 40 MG capsule 40 mg daily.       . furosemide (LASIX) 40 MG tablet Take 1 tablet (40 mg total) by mouth daily.  30 tablet  5  . hydrOXYzine (ATARAX/VISTARIL) 25 MG tablet Take 1 tablet (25 mg total) by mouth 3 (three) times daily as needed for itching.  30 tablet  0  . labetalol (NORMODYNE) 200 MG tablet 2 tablets BID  120 tablet  3  . levothyroxine (SYNTHROID, LEVOTHROID) 75 MCG tablet Take 1 tablet (75 mcg total) by mouth daily before breakfast.  30 tablet  8  . losartan (COZAAR) 100 MG tablet 100 mg daily.       . metolazone (ZAROXOLYN) 2.5 MG tablet Take 1/2 hour prior to lasix for 2-3 days prn swelling  3 tablet  3  . mirtazapine (REMERON)  30 MG tablet Take 30 mg by mouth at bedtime.      . phenytoin (DILANTIN) 100 MG ER capsule Take 200 mg by mouth 2 (two) times daily.      . polyethylene glycol powder (GLYCOLAX/MIRALAX) powder Take 17 g by mouth daily.  527 g  3  . polyethylene glycol-electrolytes (TRILYTE) 420 G solution Take 4,000 mLs by mouth as directed.  4000 mL  0  . prazosin (MINIPRESS) 2 MG capsule 2 mg.       . terbinafine (LAMISIL) 250 MG tablet 250 mg.       . tolterodine (DETROL LA) 2 MG 24 hr capsule Take 1 capsule (2 mg total) by mouth daily.  30 capsule  5  . topiramate (TOPAMAX) 50 MG tablet Take 50 mg by mouth daily.      . trihexyphenidyl (ARTANE) 5 MG tablet Take 5 mg by mouth 2 (two) times daily with a meal.      . Verapamil HCl CR 300 MG CP24 Take 1 capsule (300 mg total) by mouth daily.  30 each  5  . zolpidem (AMBIEN) 10 MG tablet Take 10 mg by mouth at bedtime as needed for sleep.      Marland Kitchen  losartan-hydrochlorothiazide (HYZAAR) 100-25 MG per tablet Take 1 tablet by mouth daily.  90 tablet  3  . thiothixene (NAVANE) 10 MG capsule Take 20 mg by mouth at bedtime as needed.        No current facility-administered medications for this visit.    Past Medical History  Diagnosis Date  . Cancer     uterine  . Hypertension   . Bipolar 1 disorder   . Hyperlipidemia   . GERD (gastroesophageal reflux disease)   . Hypothyroid   . Seizure disorder   . Migraine   . Anxiety   . Depression   . Suicidal ideation     attempted overdoses in past  . Stroke 1997    No deficits  . Seizures     last year last seizure-had brain aneurysm. ON meds  . DVT of leg (deep venous thrombosis)     history of    Past Surgical History  Procedure Laterality Date  . Radical hysterectomy    . Cholecystectomy    . Cerebral aneurysm repair  1997  . Colonoscopy with propofol N/A 08/20/2012    Procedure: COLONOSCOPY WITH PROPOFOL;  Surgeon: West Bali, MD;  Location: [facility];  Service: Endoscopy;  Laterality: N/A;  In cecum  at 0944; Total Withdrawal Time= 12 minutes    ROS:  Snores.  Otherwise as stated in the HPI and negative for all other systems.  PHYSICAL EXAM BP 98/60  Pulse 74  Ht 5\' 1"  (1.549 m)  Wt 216 lb (97.977 kg)  BMI 40.83 kg/m2 GENERAL:  Well appearing HEENT:  Pupils equal round and reactive, fundi not visualized, oral mucosa unremarkable NECK:  No jugular venous distention, waveform within normal limits, carotid upstroke brisk and symmetric, no bruits, no thyromegaly LYMPHATICS:  No cervical, inguinal adenopathy LUNGS:  Clear to auscultation bilaterally BACK:  No CVA tenderness CHEST:  Unremarkable HEART:  PMI not displaced or sustained,S1 and S2 within normal limits, no S3, no S4, no clicks, no rubs, no murmurs ABD:  Flat, positive bowel sounds normal in frequency in pitch, no bruits, no rebound, no guarding, no midline pulsatile mass, no hepatomegaly, no splenomegaly EXT:  2 plus pulses throughout, mild bilateral nonpitting lower extremity edema, no cyanosis no clubbing SKIN:  No rashes no nodules, , excoriated rash on her hands NEURO:  Cranial nerves II through XII grossly intact, motor grossly intact throughout PSYCH:  Cognitively intact, oriented to person place and time  EKG:  Sinus rhythm, rate 74, axis within normal limits, intervals with prolonged QT, low voltage in the chest leads, nonspecific diffuse T wave changes. 12/18/2012  ASSESSMENT AND PLAN  CHEST PAIN:  This is somewhat atypical. However, she has significant risk factors. I will screen her with a dobutamine echocardiogram. She would not be a little walk on a treadmill. I think the dobutamine echo so that we can also get some images of her right ventricle as she does have some lower extremity edema.  PROLONGED QT:  This may be related to medications. Caution should be used in prescribing any further QT prolonging drugs. She has had no symptoms consistent with prolonged QT syndrome.  HYPERTENSION:  Given the low blood  pressure I will reduce her Cozaar 50 mg daily.

## 2012-12-20 ENCOUNTER — Ambulatory Visit: Payer: Medicare Other | Admitting: Family Medicine

## 2012-12-26 ENCOUNTER — Other Ambulatory Visit: Payer: Self-pay | Admitting: Gastroenterology

## 2013-01-01 ENCOUNTER — Other Ambulatory Visit (HOSPITAL_COMMUNITY): Payer: Self-pay

## 2013-01-01 ENCOUNTER — Ambulatory Visit: Payer: Medicare Other | Admitting: Family Medicine

## 2013-01-03 ENCOUNTER — Other Ambulatory Visit: Payer: Self-pay | Admitting: Family Medicine

## 2013-01-06 ENCOUNTER — Ambulatory Visit (HOSPITAL_BASED_OUTPATIENT_CLINIC_OR_DEPARTMENT_OTHER): Payer: Medicare Other | Admitting: Radiology

## 2013-01-06 ENCOUNTER — Other Ambulatory Visit (HOSPITAL_COMMUNITY): Payer: Medicare Other | Admitting: *Deleted

## 2013-01-06 ENCOUNTER — Ambulatory Visit (HOSPITAL_COMMUNITY): Payer: Medicare Other | Attending: Cardiology

## 2013-01-06 DIAGNOSIS — Z87891 Personal history of nicotine dependence: Secondary | ICD-10-CM | POA: Insufficient documentation

## 2013-01-06 DIAGNOSIS — R0989 Other specified symptoms and signs involving the circulatory and respiratory systems: Secondary | ICD-10-CM

## 2013-01-06 DIAGNOSIS — I10 Essential (primary) hypertension: Secondary | ICD-10-CM | POA: Insufficient documentation

## 2013-01-06 DIAGNOSIS — Z6841 Body Mass Index (BMI) 40.0 and over, adult: Secondary | ICD-10-CM | POA: Insufficient documentation

## 2013-01-06 DIAGNOSIS — R079 Chest pain, unspecified: Secondary | ICD-10-CM

## 2013-01-06 DIAGNOSIS — E669 Obesity, unspecified: Secondary | ICD-10-CM | POA: Insufficient documentation

## 2013-01-06 DIAGNOSIS — R072 Precordial pain: Secondary | ICD-10-CM

## 2013-01-06 MED ORDER — SODIUM CHLORIDE 0.9 % IV SOLN
40.0000 ug/kg | Freq: Once | INTRAVENOUS | Status: AC
  Administered 2013-01-06: 40 ug/kg/min via INTRAVENOUS

## 2013-01-06 MED ORDER — ATROPINE SULFATE 0.1 MG/ML IJ SOLN
0.5000 mg | Freq: Once | INTRAMUSCULAR | Status: AC
  Administered 2013-01-06: 0.5 mg via INTRAVENOUS

## 2013-01-06 NOTE — Progress Notes (Signed)
Stress Echocardiogram performed.  

## 2013-01-07 ENCOUNTER — Ambulatory Visit (INDEPENDENT_AMBULATORY_CARE_PROVIDER_SITE_OTHER): Payer: Medicare Other | Admitting: Family Medicine

## 2013-01-07 ENCOUNTER — Encounter: Payer: Self-pay | Admitting: Family Medicine

## 2013-01-07 VITALS — BP 123/84 | HR 78 | Temp 98.6°F | Ht 61.0 in | Wt 227.2 lb

## 2013-01-07 DIAGNOSIS — R0609 Other forms of dyspnea: Secondary | ICD-10-CM

## 2013-01-07 DIAGNOSIS — R5383 Other fatigue: Secondary | ICD-10-CM

## 2013-01-07 DIAGNOSIS — R0989 Other specified symptoms and signs involving the circulatory and respiratory systems: Secondary | ICD-10-CM

## 2013-01-07 DIAGNOSIS — R5381 Other malaise: Secondary | ICD-10-CM

## 2013-01-07 DIAGNOSIS — R0683 Snoring: Secondary | ICD-10-CM

## 2013-01-07 DIAGNOSIS — G4710 Hypersomnia, unspecified: Secondary | ICD-10-CM

## 2013-01-07 LAB — POCT CBC
Granulocyte percent: 72.1 %G (ref 37–80)
HCT, POC: 32.6 % — AB (ref 37.7–47.9)
Hemoglobin: 11.2 g/dL — AB (ref 12.2–16.2)
Lymph, poc: 1.6 (ref 0.6–3.4)
MCH, POC: 29 pg (ref 27–31.2)
MCHC: 34.4 g/dL (ref 31.8–35.4)
MCV: 84.2 fL (ref 80–97)
MPV: 7.6 fL (ref 0–99.8)
POC Granulocyte: 5.1 (ref 2–6.9)
POC LYMPH PERCENT: 22 %L (ref 10–50)
Platelet Count, POC: 218 10*3/uL (ref 142–424)
RBC: 3.9 M/uL — AB (ref 4.04–5.48)
RDW, POC: 13.4 %
WBC: 7.1 10*3/uL (ref 4.6–10.2)

## 2013-01-07 NOTE — Patient Instructions (Signed)

## 2013-01-07 NOTE — Progress Notes (Signed)
  Subjective:    Patient ID: Robin Jensen, female    DOB: [birth year], 52 y.o.   MRN: [id number]  HPI This 52 y.o. female presents for evaluation of follow up appointment. She is seeing [facility].  She has been having problems with sleep at night and snoring And possible sleep apnea.  She has been has hx of hypothyroidism, hyperlipidemia, Hypertension, obesity, and psychiatric illness.  She has been seeing psychiatry and she is Feeling a lot better.  Review of Systems C/o snoring and excessive daytime sleepiness. No chest pain, SOB, HA, dizziness, vision change, N/V, diarrhea, constipation, dysuria, urinary urgency or frequency, myalgias, arthralgias or rash.     Objective:   Physical Exam Vital signs noted  Well developed well nourished female.  HEENT - Head atraumatic Normocephalic                Eyes - PERRLA, Conjuctiva - clear Sclera- Clear EOMI                Ears - EAC's Wnl TM's Wnl Gross Hearing WNL                Nose - Nares patent                 Throat - oropharanx wnl Respiratory - Lungs CTA bilateral Cardiac - RRR S1 and S2 without murmur GI - Abdomen obese soft Nontender and bowel sounds active x 4 Extremities - Generalized edema bilateral lower extremities and few Areas of excoriation from itching and much improved since last visit.       Assessment & Plan:  Snoring - Plan: Ambulatory referral to Pulmonology  Hypersomnolence - Plan: Ambulatory referral to Pulmonology  Other malaise and fatigue - Plan: Ambulatory referral to Pulmonology, POCT CBC, [id number]

## 2013-01-08 LAB — CMP14+EGFR
ALT: 18 IU/L (ref 0–32)
AST: 32 IU/L (ref 0–40)
Albumin/Globulin Ratio: 1.3 (ref 1.1–2.5)
Albumin: 3.6 g/dL (ref 3.5–5.5)
Alkaline Phosphatase: 116 IU/L (ref 39–117)
BUN/Creatinine Ratio: 12 (ref 9–23)
BUN: 11 mg/dL (ref 6–24)
CO2: 32 mmol/L — ABNORMAL HIGH (ref 18–29)
Calcium: 9.1 mg/dL (ref 8.7–10.2)
Chloride: 83 mmol/L — ABNORMAL LOW (ref 97–108)
Creatinine, Ser: 0.95 mg/dL (ref 0.57–1.00)
GFR calc Af Amer: 80 mL/min/{1.73_m2} (ref >59)
GFR calc non Af Amer: 69 mL/min/{1.73_m2} (ref >59)
Globulin, Total: 2.8 g/dL (ref 1.5–4.5)
Glucose: 93 mg/dL (ref 65–99)
Potassium: 3.5 mmol/L (ref 3.5–5.2)
Sodium: 130 mmol/L — ABNORMAL LOW (ref 134–144)
Total Bilirubin: 0.2 mg/dL (ref 0.0–1.2)
Total Protein: 6.4 g/dL (ref 6.0–8.5)

## 2013-01-20 ENCOUNTER — Telehealth: Payer: Self-pay | Admitting: *Deleted

## 2013-01-20 NOTE — Telephone Encounter (Signed)
Patient notified

## 2013-01-20 NOTE — Telephone Encounter (Signed)
Message copied by Baltazar Apo on Mon Jan 20, 2013  9:54 AM ------      Message from: Deatra Canter      Created: Thu Jan 09, 2013  1:08 PM       Need to make sure not drinking excessive H2O. She needs to concentrate fluids and not drink free H20 because NA is low, repeat Bmp in a month. ------

## 2013-01-30 ENCOUNTER — Other Ambulatory Visit: Payer: Self-pay | Admitting: *Deleted

## 2013-01-31 ENCOUNTER — Telehealth: Payer: Self-pay | Admitting: Family Medicine

## 2013-02-03 ENCOUNTER — Other Ambulatory Visit: Payer: Self-pay

## 2013-02-03 MED ORDER — LABETALOL HCL 200 MG PO TABS: ORAL_TABLET | ORAL | Status: DC

## 2013-02-03 NOTE — Telephone Encounter (Signed)
x

## 2013-02-04 MED ORDER — LABETALOL HCL 200 MG PO TABS: ORAL_TABLET | ORAL | Status: DC

## 2013-02-13 ENCOUNTER — Ambulatory Visit: Payer: Medicare Other | Admitting: Family Medicine

## 2013-04-09 ENCOUNTER — Ambulatory Visit: Payer: Medicare Other | Admitting: Family Medicine

## 2013-05-06 DIAGNOSIS — C539 Malignant neoplasm of cervix uteri, unspecified: Secondary | ICD-10-CM | POA: Insufficient documentation

## 2014-01-02 ENCOUNTER — Other Ambulatory Visit: Payer: Self-pay | Admitting: *Deleted

## 2014-01-02 MED ORDER — LOSARTAN POTASSIUM 50 MG PO TABS: 50.0000 mg | ORAL_TABLET | Freq: Every day | ORAL | Status: DC

## 2014-06-18 ENCOUNTER — Ambulatory Visit: Payer: Self-pay | Admitting: Physician Assistant

## 2014-06-29 ENCOUNTER — Emergency Department: Payer: Self-pay | Admitting: Student

## 2015-07-08 ENCOUNTER — Encounter: Payer: Self-pay | Admitting: Emergency Medicine

## 2015-07-08 ENCOUNTER — Emergency Department
Admission: EM | Admit: 2015-07-08 | Discharge: 2015-07-08 | Disposition: A | Discharge status: Home / Self Care | Payer: Medicare Other | Attending: Emergency Medicine | Admitting: Emergency Medicine

## 2015-07-08 ENCOUNTER — Emergency Department: Payer: Medicare Other

## 2015-07-08 DIAGNOSIS — F319 Bipolar disorder, unspecified: Secondary | ICD-10-CM | POA: Insufficient documentation

## 2015-07-08 DIAGNOSIS — R112 Nausea with vomiting, unspecified: Secondary | ICD-10-CM | POA: Diagnosis not present

## 2015-07-08 DIAGNOSIS — Z8679 Personal history of other diseases of the circulatory system: Secondary | ICD-10-CM | POA: Diagnosis not present

## 2015-07-08 DIAGNOSIS — G43909 Migraine, unspecified, not intractable, without status migrainosus: Secondary | ICD-10-CM | POA: Diagnosis not present

## 2015-07-08 DIAGNOSIS — I671 Cerebral aneurysm, nonruptured: Secondary | ICD-10-CM | POA: Diagnosis not present

## 2015-07-08 DIAGNOSIS — I639 Cerebral infarction, unspecified: Secondary | ICD-10-CM | POA: Diagnosis not present

## 2015-07-08 DIAGNOSIS — B9620 Unspecified Escherichia coli [E. coli] as the cause of diseases classified elsewhere: Secondary | ICD-10-CM | POA: Insufficient documentation

## 2015-07-08 DIAGNOSIS — E785 Hyperlipidemia, unspecified: Secondary | ICD-10-CM | POA: Diagnosis not present

## 2015-07-08 DIAGNOSIS — R1031 Right lower quadrant pain: Secondary | ICD-10-CM | POA: Diagnosis present

## 2015-07-08 DIAGNOSIS — I10 Essential (primary) hypertension: Secondary | ICD-10-CM | POA: Diagnosis not present

## 2015-07-08 DIAGNOSIS — Z79899 Other long term (current) drug therapy: Secondary | ICD-10-CM | POA: Diagnosis not present

## 2015-07-08 DIAGNOSIS — R45851 Suicidal ideations: Secondary | ICD-10-CM | POA: Diagnosis not present

## 2015-07-08 DIAGNOSIS — C574 Malignant neoplasm of uterine adnexa, unspecified: Secondary | ICD-10-CM | POA: Diagnosis not present

## 2015-07-08 DIAGNOSIS — E039 Hypothyroidism, unspecified: Secondary | ICD-10-CM | POA: Diagnosis not present

## 2015-07-08 DIAGNOSIS — Z86718 Personal history of other venous thrombosis and embolism: Secondary | ICD-10-CM | POA: Diagnosis not present

## 2015-07-08 DIAGNOSIS — K219 Gastro-esophageal reflux disease without esophagitis: Secondary | ICD-10-CM | POA: Insufficient documentation

## 2015-07-08 DIAGNOSIS — N390 Urinary tract infection, site not specified: Secondary | ICD-10-CM | POA: Diagnosis not present

## 2015-07-08 DIAGNOSIS — Z87891 Personal history of nicotine dependence: Secondary | ICD-10-CM | POA: Insufficient documentation

## 2015-07-08 DIAGNOSIS — R569 Unspecified convulsions: Secondary | ICD-10-CM | POA: Insufficient documentation

## 2015-07-08 LAB — CBC
HCT: 38 % (ref 35.0–47.0)
HEMOGLOBIN: 13 g/dL (ref 12.0–16.0)
MCH: 29.5 pg (ref 26.0–34.0)
MCHC: 34.3 g/dL (ref 32.0–36.0)
MCV: 86 fL (ref 80.0–100.0)
PLATELETS: 208 10*3/uL (ref 150–440)
RBC: 4.42 MIL/uL (ref 3.80–5.20)
RDW: 13.1 % (ref 11.5–14.5)
WBC: 7.3 10*3/uL (ref 3.6–11.0)

## 2015-07-08 LAB — COMPREHENSIVE METABOLIC PANEL
ALT: 28 U/L (ref 14–54)
AST: 29 U/L (ref 15–41)
Albumin: 3.9 g/dL (ref 3.5–5.0)
Alkaline Phosphatase: 67 U/L (ref 38–126)
Anion gap: 6 (ref 5–15)
BUN: 21 mg/dL — ABNORMAL HIGH (ref 6–20)
CHLORIDE: 100 mmol/L — AB (ref 101–111)
CO2: 31 mmol/L (ref 22–32)
CREATININE: 1 mg/dL (ref 0.44–1.00)
Calcium: 8.6 mg/dL — ABNORMAL LOW (ref 8.9–10.3)
GFR calc non Af Amer: >60 mL/min (ref >60)
Glucose, Bld: 103 mg/dL — ABNORMAL HIGH (ref 65–99)
Potassium: 3.6 mmol/L (ref 3.5–5.1)
SODIUM: 137 mmol/L (ref 135–145)
Total Bilirubin: 0.4 mg/dL (ref 0.3–1.2)
Total Protein: 6.8 g/dL (ref 6.5–8.1)

## 2015-07-08 LAB — URINALYSIS COMPLETE WITH MICROSCOPIC (ARMC ONLY)
Bilirubin Urine: NEGATIVE
Glucose, UA: NEGATIVE mg/dL
HGB URINE DIPSTICK: NEGATIVE
Ketones, ur: NEGATIVE mg/dL
Nitrite: NEGATIVE
PH: 7 (ref 5.0–8.0)
PROTEIN: NEGATIVE mg/dL
Specific Gravity, Urine: 1.026 (ref 1.005–1.030)

## 2015-07-08 LAB — LIPASE, BLOOD: LIPASE: 28 U/L (ref 11–51)

## 2015-07-08 MED ORDER — ONDANSETRON 4 MG PO TBDP: 4.0000 mg | ORAL_TABLET | Freq: Three times a day (TID) | ORAL | Status: DC | PRN

## 2015-07-08 MED ORDER — LEVOFLOXACIN 500 MG PO TABS
500.0000 mg | ORAL_TABLET | Freq: Once | ORAL | Status: AC
Start: 2015-07-08 — End: 2015-07-08
  Administered 2015-07-08: 500 mg via ORAL
  Filled 2015-07-08: qty 1

## 2015-07-08 MED ORDER — MORPHINE SULFATE (PF) 4 MG/ML IV SOLN
4.0000 mg | Freq: Once | INTRAVENOUS | Status: AC
  Administered 2015-07-08: 4 mg via INTRAVENOUS
  Filled 2015-07-08: qty 1

## 2015-07-08 MED ORDER — ONDANSETRON HCL 4 MG/2ML IJ SOLN
4.0000 mg | Freq: Once | INTRAMUSCULAR | Status: AC
  Administered 2015-07-08: 4 mg via INTRAVENOUS
  Filled 2015-07-08: qty 2

## 2015-07-08 MED ORDER — LEVOFLOXACIN 500 MG PO TABS: 500.0000 mg | ORAL_TABLET | Freq: Every day | ORAL | Status: AC

## 2015-07-08 MED ORDER — HYDROCODONE-ACETAMINOPHEN 5-325 MG PO TABS: 1.0000 | ORAL_TABLET | ORAL | Status: DC | PRN

## 2015-07-08 MED ORDER — SODIUM CHLORIDE 0.9 % IV BOLUS (SEPSIS)
1000.0000 mL | Freq: Once | INTRAVENOUS | Status: AC
  Administered 2015-07-08: 1000 mL via INTRAVENOUS

## 2015-07-08 MED ORDER — IOHEXOL 300 MG/ML  SOLN
100.0000 mL | Freq: Once | INTRAMUSCULAR | Status: AC | PRN
  Administered 2015-07-08: 100 mL via INTRAVENOUS

## 2015-07-08 MED ORDER — IOHEXOL 240 MG/ML SOLN
25.0000 mL | Freq: Once | INTRAMUSCULAR | Status: AC | PRN
  Administered 2015-07-08: 25 mL via ORAL

## 2015-07-08 NOTE — Discharge Instructions (Signed)
Please take your medications as prescribed. Please follow-up with your primary care physician in 2-3 days for recheck/reevaluation. Return to the emergency department for any worsening pain, fever, or vomiting unable to keep down antibiotics.   Abdominal Pain, Adult Many things can cause abdominal pain. Usually, abdominal pain is not caused by a disease and will improve without treatment. It can often be observed and treated at home. Your health care provider will do a physical exam and possibly order blood tests and X-rays to help determine the seriousness of your pain. However, in many cases, more time must pass before a clear cause of the pain can be found. Before that point, your health care provider may not know if you need more testing or further treatment. HOME CARE INSTRUCTIONS Monitor your abdominal pain for any changes. The following actions may help to alleviate any discomfort you are experiencing:  Only take over-the-counter or prescription medicines as directed by your health care provider.  Do not take laxatives unless directed to do so by your health care provider.  Try a clear liquid diet (broth, tea, or water) as directed by your health care provider. Slowly move to a bland diet as tolerated. SEEK MEDICAL CARE IF:  You have unexplained abdominal pain.  You have abdominal pain associated with nausea or diarrhea.  You have pain when you urinate or have a bowel movement.  You experience abdominal pain that wakes you in the night.  You have abdominal pain that is worsened or improved by eating food.  You have abdominal pain that is worsened with eating fatty foods.  You have a fever. SEEK IMMEDIATE MEDICAL CARE IF:  Your pain does not go away within 2 hours.  You keep throwing up (vomiting).  Your pain is felt only in portions of the abdomen, such as the right side or the left lower portion of the abdomen.  You pass bloody or black tarry stools. MAKE SURE  YOU:  Understand these instructions.  Will watch your condition.  Will get help right away if you are not doing well or get worse.   This information is not intended to replace advice given to you by your health care provider. Make sure you discuss any questions you have with your health care provider.   Document Released: 01/18/2005 Document Revised: 12/30/2014 Document Reviewed: 12/18/2012 Elsevier Interactive Patient Education 2016 [employer]  Urinary Tract Infection A urinary tract infection (UTI) can occur any place along the urinary tract. The tract includes the kidneys, ureters, bladder, and urethra. A type of germ called bacteria often causes a UTI. UTIs are often helped with antibiotic medicine.  HOME CARE   If given, take antibiotics as told by your doctor. Finish them even if you start to feel better.  Drink enough fluids to keep your pee (urine) clear or pale yellow.  Avoid tea, drinks with caffeine, and bubbly (carbonated) drinks.  Pee often. Avoid holding your pee in for a long time.  Pee before and after having sex (intercourse).  Wipe from front to back after you poop (bowel movement) if you are a woman. Use each tissue only once. GET HELP RIGHT AWAY IF:   You have back pain.  You have lower belly (abdominal) pain.  You have chills.  You feel sick to your stomach (nauseous).  You throw up (vomit).  Your burning or discomfort with peeing does not go away.  You have a fever.  Your symptoms are not better in 3 days. MAKE SURE  YOU:   Understand these instructions.  Will watch your condition.  Will get help right away if you are not doing well or get worse.   This information is not intended to replace advice given to you by your health care provider. Make sure you discuss any questions you have with your health care provider.   Document Released: 09/27/2007 Document Revised: 05/01/2014 Document Reviewed: 11/09/2011 Elsevier Interactive Patient  Education [employer].

## 2015-07-08 NOTE — ED Provider Notes (Signed)
[facility] Emergency Department Provider Note  Time seen: 3:12 PM  I have reviewed the triage vital signs and the nursing notes.   HISTORY  Chief Complaint Abdominal Pain; Emesis; and Nausea    HPI Robin Jensen is a 55 y.o. female with a past medical history of hypertension, bipolar, hyperlipidemia, hypothyroidism, anxiety, depression, CVA, presents the emergency department with right lower quadrant pain. According to the patient for the past one month she has had right lower quadrant pain, worse over the past one week. Patient was finally able see her primary care doctor today who sent her to the emergency department for further evaluation. Patient states frequent nausea and vomiting, denies diarrhea but does state her last bowel movement was approximately 5-6 days ago. Denies fever. Denies dysuria but does state a smell to her urine and dark color. Currently describes the right lower quadrant pain as a 10/10 dull aching pain     Past Medical History  Diagnosis Date  . Cancer ([place])     uterine  . Hypertension   . Bipolar 1 disorder ([place])   . Hyperlipidemia   . GERD (gastroesophageal reflux disease)   . Hypothyroid   . Seizure disorder ([place])   . Migraine   . Anxiety   . Depression   . Suicidal ideation     attempted overdoses in past  . Stroke ([place]) 1997    No deficits  . Seizures ([place])     last year last seizure-had brain aneurysm. ON meds  . DVT of leg (deep venous thrombosis) ([place])     history of    Patient Active Problem List   Diagnosis Date Noted  . Aneurysm, cerebral, nonruptured 08/14/2012  . Unspecified hypothyroidism 08/14/2012  . Other and unspecified hyperlipidemia 08/14/2012  . Seizures ([place]) 08/14/2012  . Insomnia 08/14/2012  . Unspecified constipation 08/07/2012  . FHx: colon cancer 08/07/2012  . Right sided abdominal pain 08/07/2012  . Essential hypertension, benign 08/07/2012  . Abnormal LFTs 08/07/2012    Past  Surgical History  Procedure Laterality Date  . Radical hysterectomy    . Cholecystectomy    . Cerebral aneurysm repair  1997  . Colonoscopy with propofol N/A 08/20/2012    Procedure: COLONOSCOPY WITH PROPOFOL;  Surgeon: [name], MD;  Location: [facility];  Service: Endoscopy;  Laterality: N/A;  In cecum at [place]; Total Withdrawal Time= 12 minutes    Current Outpatient Rx  Name  Route  Sig  Dispense  Refill  . ARIPiprazole (ABILIFY) 5 MG tablet   Oral   Take 5 mg by mouth daily.         Marland Kitchen atorvastatin (LIPITOR) 40 MG tablet   Oral   Take 40 mg by mouth daily.         . diazepam (VALIUM) 5 MG tablet   Oral   Take 5 mg by mouth 3 (three) times daily.         Marland Kitchen FLUoxetine (PROZAC) 40 MG capsule      40 mg daily.          . furosemide (LASIX) 40 MG tablet   Oral   Take 1 tablet (40 mg total) by mouth daily.   30 tablet   5   . hydrOXYzine (ATARAX/VISTARIL) 25 MG tablet      TAKE ONE TABLET THREE TIMES DAILY AS NEEDED FOR ITCHING   90 tablet   4   . labetalol (NORMODYNE) 200 MG tablet  2 tablets BID   120 tablet   3   . labetalol (NORMODYNE) 200 MG tablet      2 tablets BID   120 tablet   2   . levothyroxine (SYNTHROID, LEVOTHROID) 75 MCG tablet   Oral   Take 1 tablet (75 mcg total) by mouth daily before breakfast.   30 tablet   8   . losartan (COZAAR) 50 MG tablet   Oral   Take 1 tablet (50 mg total) by mouth daily.   30 tablet   0     **PATIENT NEEDS APPOINTMENT**   . losartan-hydrochlorothiazide (HYZAAR) 100-25 MG per tablet   Oral   Take 1 tablet by mouth daily.   90 tablet   3   . metolazone (ZAROXOLYN) 2.5 MG tablet      Take 1/2 hour prior to lasix for 2-3 days prn swelling   3 tablet   3     Please deliver   . mirtazapine (REMERON) 30 MG tablet   Oral   Take 30 mg by mouth at bedtime.         . phenytoin (DILANTIN) 100 MG ER capsule   Oral   Take 200 mg by mouth 2 (two) times daily.         . polyethylene glycol  powder (GLYCOLAX/MIRALAX) powder      MIX 1 CAPFUL (17G) IN 8 OUNCES OF JUICE/WATER AND DRINK ONCE DAILY.   527 g   5   . prazosin (MINIPRESS) 2 MG capsule      2 mg.          . terbinafine (LAMISIL) 250 MG tablet      250 mg.          . thiothixene (NAVANE) 10 MG capsule   Oral   Take 20 mg by mouth at bedtime as needed.          . tolterodine (DETROL LA) 2 MG 24 hr capsule   Oral   Take 1 capsule (2 mg total) by mouth daily.   30 capsule   5   . topiramate (TOPAMAX) 50 MG tablet   Oral   Take 50 mg by mouth daily.         . trihexyphenidyl (ARTANE) 5 MG tablet   Oral   Take 5 mg by mouth 2 (two) times daily with a meal.         . Verapamil HCl CR 300 MG CP24   Oral   Take 1 capsule (300 mg total) by mouth daily.   30 each   5   . zolpidem (AMBIEN) 10 MG tablet   Oral   Take 10 mg by mouth at bedtime as needed for sleep.           Allergies Penicillins; Latex; Lisinopril; and Sulfa antibiotics  Family History  Problem Relation Age of Onset  . Colon cancer Father     less than age 56  . Colon cancer Paternal Uncle     68s  . Colon cancer Paternal Grandfather     Social History Social History  Substance Use Topics  . Smoking status: Former Smoker    Quit date: 08/15/2005  . Smokeless tobacco: None  . 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 Cardiovascular: Negative for chest pain. Respiratory: Negative for shortness of breath. Gastrointestinal: Right lower quadrant abdominal pain. Positive for nausea and vomiting. Negative  for diarrhea. Positive for constipation. Genitourinary: Negative for dysuria. Positive for urine odor. Musculoskeletal: Negative for back pain Neurological: Negative for headache 10-point ROS otherwise negative.  ____________________________________________   PHYSICAL EXAM:  VITAL SIGNS: ED Triage Vitals  Enc Vitals Group     BP 07/08/15 1243  121/71 mmHg     Pulse Rate 07/08/15 1243 68     Resp 07/08/15 1243 16     Temp 07/08/15 1243 98.2 F (36.8 C)     Temp Source 07/08/15 1243 Oral     SpO2 07/08/15 1243 98 %     Weight 07/08/15 1243 171 lb (77.565 kg)     Height 07/08/15 1243 5\' 1"  (1.549 m)     Head Cir --      Peak Flow --      Pain Score 07/08/15 1245 10     Pain Loc --      Pain Edu? --      Excl. in [place]? --     Constitutional: Alert and oriented. Well appearing and in no distress. Eyes: Normal exam ENT   Head: Normocephalic and atraumatic.   Mouth/Throat: Mucous membranes are moist. Cardiovascular: Normal rate, regular rhythm. No murmur Respiratory: Normal respiratory effort without tachypnea nor retractions. Breath sounds are clear  Gastrointestinal: Soft, moderate right lower and mid abdominal tenderness palpation. No rebound or guarding. No distention. No CVA tenderness Musculoskeletal: Nontender with normal range of motion in all extremities.  Neurologic:  Normal speech and language. No gross focal neurologic deficits  Skin:  Skin is warm, dry and intact.  Psychiatric: Mood and affect are normal. Speech and behavior are normal.   ____________________________________________   RADIOLOGY  CT shows no acute abnormality  ____________________________________________    INITIAL IMPRESSION / ASSESSMENT AND PLAN / ED COURSE  Pertinent labs & imaging results that were available during my care of the patient were reviewed by me and considered in my medical decision making (see chart for details).  Patient presents the emergency department right lower quadrant pain times one month, worse over the past one week. Sent by her primary care physician for concerns of appendicitis. Patient does have moderate right lower quadrant tenderness palpation. No rebound or guarding. No CVA tenderness. Patient's labs are significant for a urinary tract infection however I do not believe the patient's discomfort is  slowly coming from her UTI. We will proceed with a CT abdomen/pelvis to further evaluate.  CT is unremarkable. Suspect the patient's discomfort is likely due to urinary tract infection. We'll place the patient on a seven-day course of Levaquin. I have added on a urine culture to her urinalysis to further evaluate. Patient will follow up with her primary care physician Monday or Tuesday for recheck. I discussed return precautions with the patient which she is agreeable.  ____________________________________________   FINAL CLINICAL IMPRESSION(S) / ED DIAGNOSES  Right lower quadrant pain Urinary tract infection   Harvest Dark, MD 07/08/15 1736

## 2015-07-08 NOTE — ED Notes (Signed)
Pt to CT at this time.

## 2015-07-08 NOTE — ED Notes (Signed)
C/O RLQ pain.  Onset of symptoms x 1 month.  Seen by PCP this morning and was sent to ED for evaluation.  Also c/o N/V x 2 weeks.  Denies diarrhea.  Denies dysuria

## 2015-07-11 LAB — URINE CULTURE: Culture: >=100000

## 2015-07-20 ENCOUNTER — Emergency Department: Payer: Medicare Other

## 2015-07-20 ENCOUNTER — Emergency Department
Admission: EM | Admit: 2015-07-20 | Discharge: 2015-07-20 | Disposition: A | Discharge status: Home / Self Care | Payer: Medicare Other | Attending: Student | Admitting: Student

## 2015-07-20 DIAGNOSIS — R109 Unspecified abdominal pain: Secondary | ICD-10-CM | POA: Insufficient documentation

## 2015-07-20 DIAGNOSIS — R519 Headache, unspecified: Secondary | ICD-10-CM

## 2015-07-20 DIAGNOSIS — E039 Hypothyroidism, unspecified: Secondary | ICD-10-CM | POA: Diagnosis not present

## 2015-07-20 DIAGNOSIS — G40909 Epilepsy, unspecified, not intractable, without status epilepticus: Secondary | ICD-10-CM | POA: Diagnosis not present

## 2015-07-20 DIAGNOSIS — I639 Cerebral infarction, unspecified: Secondary | ICD-10-CM | POA: Diagnosis not present

## 2015-07-20 DIAGNOSIS — Z87891 Personal history of nicotine dependence: Secondary | ICD-10-CM | POA: Insufficient documentation

## 2015-07-20 DIAGNOSIS — C539 Malignant neoplasm of cervix uteri, unspecified: Secondary | ICD-10-CM | POA: Insufficient documentation

## 2015-07-20 DIAGNOSIS — R45851 Suicidal ideations: Secondary | ICD-10-CM | POA: Diagnosis not present

## 2015-07-20 DIAGNOSIS — Z86718 Personal history of other venous thrombosis and embolism: Secondary | ICD-10-CM | POA: Diagnosis not present

## 2015-07-20 DIAGNOSIS — E785 Hyperlipidemia, unspecified: Secondary | ICD-10-CM | POA: Diagnosis not present

## 2015-07-20 DIAGNOSIS — R51 Headache: Secondary | ICD-10-CM | POA: Diagnosis not present

## 2015-07-20 DIAGNOSIS — F319 Bipolar disorder, unspecified: Secondary | ICD-10-CM | POA: Insufficient documentation

## 2015-07-20 DIAGNOSIS — I10 Essential (primary) hypertension: Secondary | ICD-10-CM | POA: Insufficient documentation

## 2015-07-20 DIAGNOSIS — Z91040 Latex allergy status: Secondary | ICD-10-CM | POA: Diagnosis not present

## 2015-07-20 DIAGNOSIS — R5383 Other fatigue: Secondary | ICD-10-CM | POA: Diagnosis present

## 2015-07-20 DIAGNOSIS — R1111 Vomiting without nausea: Secondary | ICD-10-CM | POA: Diagnosis not present

## 2015-07-20 DIAGNOSIS — Z79899 Other long term (current) drug therapy: Secondary | ICD-10-CM | POA: Diagnosis not present

## 2015-07-20 LAB — URINALYSIS COMPLETE WITH MICROSCOPIC (ARMC ONLY)
Glucose, UA: NEGATIVE mg/dL
Hgb urine dipstick: NEGATIVE
NITRITE: NEGATIVE
Protein, ur: 100 mg/dL — AB
SPECIFIC GRAVITY, URINE: 1.033 — AB (ref 1.005–1.030)
pH: 5 (ref 5.0–8.0)

## 2015-07-20 LAB — CBC WITH DIFFERENTIAL/PLATELET
Basophils Absolute: 0.1 10*3/uL (ref 0–0.1)
Basophils Relative: 1 %
EOS ABS: 0.1 10*3/uL (ref 0–0.7)
Eosinophils Relative: 1 %
HCT: 40.5 % (ref 35.0–47.0)
Hemoglobin: 14.1 g/dL (ref 12.0–16.0)
LYMPHS ABS: 2.3 10*3/uL (ref 1.0–3.6)
LYMPHS PCT: 28 %
MCH: 29.6 pg (ref 26.0–34.0)
MCHC: 34.9 g/dL (ref 32.0–36.0)
MCV: 84.9 fL (ref 80.0–100.0)
MONOS PCT: 7 %
Monocytes Absolute: 0.6 10*3/uL (ref 0.2–0.9)
Neutro Abs: 5.3 10*3/uL (ref 1.4–6.5)
Neutrophils Relative %: 63 %
PLATELETS: 216 10*3/uL (ref 150–440)
RBC: 4.76 MIL/uL (ref 3.80–5.20)
RDW: 12.8 % (ref 11.5–14.5)
WBC: 8.3 10*3/uL (ref 3.6–11.0)

## 2015-07-20 LAB — COMPREHENSIVE METABOLIC PANEL
ALK PHOS: 75 U/L (ref 38–126)
ALT: 26 U/L (ref 14–54)
ANION GAP: 9 (ref 5–15)
AST: 31 U/L (ref 15–41)
Albumin: 4 g/dL (ref 3.5–5.0)
BUN: 12 mg/dL (ref 6–20)
CALCIUM: 9.1 mg/dL (ref 8.9–10.3)
CHLORIDE: 101 mmol/L (ref 101–111)
CO2: 25 mmol/L (ref 22–32)
CREATININE: 1.06 mg/dL — AB (ref 0.44–1.00)
GFR, EST NON AFRICAN AMERICAN: 58 mL/min — AB (ref >60)
Glucose, Bld: 106 mg/dL — ABNORMAL HIGH (ref 65–99)
Potassium: 3.7 mmol/L (ref 3.5–5.1)
Sodium: 135 mmol/L (ref 135–145)
Total Bilirubin: 0.7 mg/dL (ref 0.3–1.2)
Total Protein: 7 g/dL (ref 6.5–8.1)

## 2015-07-20 LAB — LIPASE, BLOOD: Lipase: 24 U/L (ref 11–51)

## 2015-07-20 LAB — TROPONIN I: Troponin I: <0.03 ng/mL (ref <0.031)

## 2015-07-20 MED ORDER — PROMETHAZINE HCL 12.5 MG PO TABS: 12.5000 mg | ORAL_TABLET | Freq: Three times a day (TID) | ORAL | Status: DC | PRN

## 2015-07-20 MED ORDER — DIPHENHYDRAMINE HCL 50 MG/ML IJ SOLN
12.5000 mg | Freq: Once | INTRAMUSCULAR | Status: AC
  Administered 2015-07-20: 12.5 mg via INTRAVENOUS
  Filled 2015-07-20: qty 1

## 2015-07-20 MED ORDER — SODIUM CHLORIDE 0.9 % IV BOLUS (SEPSIS): 1000.0000 mL | Freq: Once | INTRAVENOUS | Status: DC

## 2015-07-20 MED ORDER — KETOROLAC TROMETHAMINE 30 MG/ML IJ SOLN
15.0000 mg | Freq: Once | INTRAMUSCULAR | Status: AC
  Administered 2015-07-20: 15 mg via INTRAVENOUS
  Filled 2015-07-20: qty 1

## 2015-07-20 MED ORDER — SODIUM CHLORIDE 0.9 % IV BOLUS (SEPSIS)
1000.0000 mL | Freq: Once | INTRAVENOUS | Status: AC
  Administered 2015-07-20: 1000 mL via INTRAVENOUS

## 2015-07-20 MED ORDER — ONDANSETRON HCL 4 MG/2ML IJ SOLN
INTRAMUSCULAR | Status: AC
  Administered 2015-07-20: 4 mg via INTRAVENOUS
  Filled 2015-07-20: qty 2

## 2015-07-20 MED ORDER — ONDANSETRON HCL 4 MG/2ML IJ SOLN
4.0000 mg | Freq: Once | INTRAMUSCULAR | Status: AC
  Administered 2015-07-20: 4 mg via INTRAVENOUS

## 2015-07-20 MED ORDER — METOCLOPRAMIDE HCL 5 MG/ML IJ SOLN
10.0000 mg | Freq: Once | INTRAMUSCULAR | Status: AC
  Administered 2015-07-20: 10 mg via INTRAVENOUS
  Filled 2015-07-20: qty 2

## 2015-07-20 NOTE — ED Notes (Signed)
Patient transported to Ultrasound 

## 2015-07-20 NOTE — ED Notes (Signed)
Pt also reports N/V. Unable to keep medications down

## 2015-07-20 NOTE — ED Notes (Signed)
Pt from clinic via EMS, reports BP was 70/40 at clinic, when pt sat up BP 140-100. EMS reports they did orthostatics without noticing a difference in pulse or BP. VSS. Pt reports hospitalization for UTI last week and since hasnt been feeling right

## 2015-07-20 NOTE — ED Provider Notes (Addendum)
[facility] Emergency Department Provider Note  ____________________________________________  Time seen: Approximately 3:12 PM  I have reviewed the triage vital signs and the nursing notes.   HISTORY  Chief Complaint Fatigue    HPI Robin Jensen is a 55 y.o. female with hypertension, bipolar, hyperlipidemia, hypothyroidism, anxiety, depression, CVA, remote history of cerebral aneurysm requiring craniotomy and intermittent migraine headaches since that time who presents from her primary care doctor's office for headache, hypertension, vomiting, flank pain. Patient was seen in this emergency department on 07/08/2015 for right lower quadrant pain could she had an unremarkable CT scan at that time was diagnosed with urinary tract infection and discharged with Levaquin. Her abdominal pain resolved however over the past 5 days she has had gradual onset throbbing headache in the right side of her head with nausea and vomiting, not maximal at onset, currently moderate to severe. She reports that this is consistent with her usual migraine her headaches in terms of severity and character. No fevers, no neck stiffness. She has also developed some right-sided flank pain. She has no dysuria. No diarrhea. No chest pain or difficulty breathing. At her PCPs office today, she was initially noted to be hypotensive with a systolic blood pressure in the 70s however this improved to systolic pressure of [id number] after they laid her down.     Past Medical History  Diagnosis Date  . Cancer ([place])     uterine  . Hypertension   . Bipolar 1 disorder ([place])   . Hyperlipidemia   . GERD (gastroesophageal reflux disease)   . Hypothyroid   . Seizure disorder ([place])   . Migraine   . Anxiety   . Depression   . Suicidal ideation     attempted overdoses in past  . Stroke ([place]) 1997    No deficits  . Seizures ([place])     last year last seizure-had brain aneurysm. ON meds  . DVT of leg (deep  venous thrombosis) ([place])     history of    Patient Active Problem List   Diagnosis Date Noted  . Aneurysm, cerebral, nonruptured 08/14/2012  . Unspecified hypothyroidism 08/14/2012  . Other and unspecified hyperlipidemia 08/14/2012  . Seizures ([place]) 08/14/2012  . Insomnia 08/14/2012  . Unspecified constipation 08/07/2012  . FHx: colon cancer 08/07/2012  . Right sided abdominal pain 08/07/2012  . Essential hypertension, benign 08/07/2012  . Abnormal LFTs 08/07/2012    Past Surgical History  Procedure Laterality Date  . Radical hysterectomy    . Cholecystectomy    . Cerebral aneurysm repair  1997  . Colonoscopy with propofol N/A 08/20/2012    Procedure: COLONOSCOPY WITH PROPOFOL;  Surgeon: [name], MD;  Location: [facility];  Service: Endoscopy;  Laterality: N/A;  In cecum at [place]; Total Withdrawal Time= 12 minutes    Current Outpatient Rx  Name  Route  Sig  Dispense  Refill  . ARIPiprazole (ABILIFY) 5 MG tablet   Oral   Take 5 mg by mouth daily.         Marland Kitchen atorvastatin (LIPITOR) 40 MG tablet   Oral   Take 40 mg by mouth daily.         . diazepam (VALIUM) 5 MG tablet   Oral   Take 5 mg by mouth 3 (three) times daily.         Marland Kitchen FLUoxetine (PROZAC) 40 MG capsule      40 mg daily.          Marland Kitchen  furosemide (LASIX) 40 MG tablet   Oral   Take 1 tablet (40 mg total) by mouth daily.   30 tablet   5   . HYDROcodone-acetaminophen (NORCO/VICODIN) 5-325 MG tablet   Oral   Take 1 tablet by mouth every 4 (four) hours as needed for moderate pain.   15 tablet   0   . hydrOXYzine (ATARAX/VISTARIL) 25 MG tablet      TAKE ONE TABLET THREE TIMES DAILY AS NEEDED FOR ITCHING   90 tablet   4   . labetalol (NORMODYNE) 200 MG tablet      2 tablets BID   120 tablet   3   . labetalol (NORMODYNE) 200 MG tablet      2 tablets BID   120 tablet   2   . levothyroxine (SYNTHROID, LEVOTHROID) 75 MCG tablet   Oral   Take 1 tablet (75 mcg total) by mouth daily before  breakfast.   30 tablet   8   . losartan (COZAAR) 50 MG tablet   Oral   Take 1 tablet (50 mg total) by mouth daily.   30 tablet   0     **PATIENT NEEDS APPOINTMENT**   . losartan-hydrochlorothiazide (HYZAAR) 100-25 MG per tablet   Oral   Take 1 tablet by mouth daily.   90 tablet   3   . metolazone (ZAROXOLYN) 2.5 MG tablet      Take 1/2 hour prior to lasix for 2-3 days prn swelling   3 tablet   3     Please deliver   . mirtazapine (REMERON) 30 MG tablet   Oral   Take 30 mg by mouth at bedtime.         . ondansetron (ZOFRAN ODT) 4 MG disintegrating tablet   Oral   Take 1 tablet (4 mg total) by mouth every 8 (eight) hours as needed for nausea or vomiting.   20 tablet   0   . phenytoin (DILANTIN) 100 MG ER capsule   Oral   Take 200 mg by mouth 2 (two) times daily.         . polyethylene glycol powder (GLYCOLAX/MIRALAX) powder      MIX 1 CAPFUL (17G) IN 8 OUNCES OF JUICE/WATER AND DRINK ONCE DAILY.   527 g   5   . prazosin (MINIPRESS) 2 MG capsule      2 mg.          . terbinafine (LAMISIL) 250 MG tablet      250 mg.          . thiothixene (NAVANE) 10 MG capsule   Oral   Take 20 mg by mouth at bedtime as needed.          . tolterodine (DETROL LA) 2 MG 24 hr capsule   Oral   Take 1 capsule (2 mg total) by mouth daily.   30 capsule   5   . topiramate (TOPAMAX) 50 MG tablet   Oral   Take 50 mg by mouth daily.         . trihexyphenidyl (ARTANE) 5 MG tablet   Oral   Take 5 mg by mouth 2 (two) times daily with a meal.         . Verapamil HCl CR 300 MG CP24   Oral   Take 1 capsule (300 mg total) by mouth daily.   30 each   5   . zolpidem (AMBIEN) 10 MG tablet   Oral  Take 10 mg by mouth at bedtime as needed for sleep.           Allergies Penicillins; Latex; Lisinopril; and Sulfa antibiotics  Family History  Problem Relation Age of Onset  . Colon cancer Father     less than age 10  . Colon cancer Paternal Uncle     89s   . Colon cancer Paternal Grandfather     Social History Social History  Substance Use Topics  . Smoking status: Former Smoker    Quit date: 08/15/2005  . Smokeless tobacco: None  . 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 ENT: No sore throat. Cardiovascular: Denies chest pain. Respiratory: Denies shortness of breath. Gastrointestinal: No abdominal pain.  + nausea, + vomiting.  No diarrhea.  No constipation. Genitourinary: Negative for dysuria. Musculoskeletal: Positive for right flank pain. Skin: Negative for rash. Neurological: Positive for headache, no  focal weakness or numbness.  10-point ROS otherwise negative.  ____________________________________________   PHYSICAL EXAM:  VITAL SIGNS: ED Triage Vitals  Enc Vitals Group     BP 07/20/15 1437 140/99 mmHg     Pulse Rate 07/20/15 1436 78     Resp 07/20/15 1436 11     Temp 07/20/15 1436 98.6 F (37 C)     Temp src --      SpO2 07/20/15 1436 96 %     Weight 07/20/15 1435 170 lb (77.111 kg)     Height 07/20/15 1435 5\' 1"  (1.549 m)     Head Cir --      Peak Flow --      Pain Score 07/20/15 1435 10     Pain Loc --      Pain Edu? --      Excl. in [place]? --     Constitutional: Alert and oriented. Well appearing and in no acute distress. Eyes: Conjunctivae are normal. PERRL. EOMI. Head: Atraumatic. Nose: No congestion/rhinnorhea. Mouth/Throat: Mucous membranes are moist.  Oropharynx non-erythematous. Neck: No stridor.  Supple without meningismus. Cardiovascular: Normal rate, regular rhythm. Grossly normal heart sounds.  Good peripheral circulation. Respiratory: Normal respiratory effort.  No retractions. Lungs CTAB. Gastrointestinal: Soft and nontender. No distention.+ mild right CVA tenderness. Genitourinary: deferred Musculoskeletal: No lower extremity tenderness nor edema.  No joint effusions. Neurologic:  Normal speech and  language. No gross focal neurologic deficits are appreciated. No gait instability. 5 out of 5 strength in bilateral upper and lower extremities. Sensation intact to light touch throughout. Cranial nerves II through XII intact. Skin:  Skin is warm, dry and intact. No rash noted. Psychiatric: Mood and affect are normal. Speech and behavior are normal.  ____________________________________________   LABS (all labs ordered are listed, but only abnormal results are displayed)  Labs Reviewed  COMPREHENSIVE METABOLIC PANEL - Abnormal; Notable for the following:    Glucose, Bld 106 (*)    Creatinine, Ser 1.06 (*)    GFR calc non Af Amer 58 (*)    All other components within normal limits  URINALYSIS COMPLETEWITH MICROSCOPIC ([facility] ONLY) - Abnormal; Notable for the following:    Color, Urine AMBER (*)    APPearance HAZY (*)    Bilirubin Urine 1+ (*)    Ketones, ur TRACE (*)    Specific Gravity, Urine 1.033 (*)    Protein, ur 100 (*)    Leukocytes, UA TRACE (*)    Bacteria, UA RARE (*)  Squamous Epithelial / LPF 6-30 (*)    All other components within normal limits  CULTURE, BLOOD (ROUTINE X 2)  CULTURE, BLOOD (ROUTINE X 2)  CBC WITH DIFFERENTIAL/PLATELET  LIPASE, BLOOD  TROPONIN I   ____________________________________________  EKG  ED ECG REPORT I, Joanne Gavel, the attending physician, personally viewed and interpreted this ECG.   Date: 07/20/2015  EKG Time: 14:42  Rate: 81  Rhythm: normal sinus rhythm  Axis: normal  Intervals:none  ST&T Change: No Acute ST elevation. Nonspecific T-wave abnormality in V2, V3, V4, V6.  ____________________________________________  RADIOLOGY  Renal ultrasound  IMPRESSION: 1. There is lobulated contour of the right kidney and mild increased echogenicity. Findings may be due to atrophy or medical renal disease. The right kidney is smaller in size measures 8.5 cm in length. Left kidney measures 11 cm in length. No hydronephrosis.  No renal calculi.  ____________________________________________   PROCEDURES  Procedure(s) performed: None  Critical Care performed: No  ____________________________________________   INITIAL IMPRESSION / ASSESSMENT AND PLAN / ED COURSE  Pertinent labs & imaging results that were available during my care of the patient were reviewed by me and considered in my medical decision making (see chart for details).  LACOURTNEY NORRICK is a 55 y.o. female with hypertension, bipolar, hyperlipidemia, hypothyroidism, anxiety, depression, CVA, remote history of cerebral aneurysm requiring craniotomy and intermittent migraine headaches since that time who presents from her primary care doctor's office for headache, hypertension, vomiting, flank pain. On exam, she is generally well-appearing and in no acute distress. Vital signs stable, she is afebrile. Neck is supple without meningismus, doubt meningitis. She has an intact neurological examination and reports that her headache today is similar to her usual migraine so we will treat this with a migraine cocktail. She does have some right CVA tenderness so we'll obtain screening abdominal labs, check urinalysis for evidence of persistent urinary tract infection. We'll give IV fluids. Her abdominal exam is otherwise benign.  ----------------------------------------- 6:54 PM on 07/20/2015 ----------------------------------------- Patient reports complete resolution of her headache at this time after reglan, toradol and benadryl. Resolved migraine headache. Doubt subarachnoid hemorrhage. Labs reviewed. CMP is notable for very mild creatinine elevation at 1.06, IV fluids were given. Urinalysis with 6-30 white blood cells however there are also multiple squamous cells, negative nitrates, trace leukocytes. This does not appear consistent with a persistent urinary tract infection and I reviewed her blood urine cultures from 07/08/2015 and they were pansensitive so  Levaquin should have treated her infection. I doubt that her right CVA tenderness is representative of acute pyelonephritis. Additionally, she has no leukocytosis. Renal ultrasound shows no findings which would be associated with a complicated pyelonephritis, and I discussed the ultrasound findings with Dr. [place] Pink of Radiology who reports that they are nonspecific. No hydronephrosis noted. Patient reports that she feels much better at this time. She is sitting up in bed eating a [place] sandwich. I do not think she requires repeat imaging of her abdomen as she had a reassuring CT scan performed 12 days ago. She does have orthostatic hypotension with a 30 point drop in her blood pressure upon standing so we'll give additional IV fluids and reassess.  ----------------------------------------- 7:52 PM on 07/20/2015 ----------------------------------------- Troponin negative. The patient has tolerated her by mouth intake without any issue. She has had no vomiting since arrival to the emergency department. We rechecked her blood pressure and she does not appear to be orthostatic after all, in fact that the 30 point decrease in  her blood pressure that was initially documented on standing was likely spurious as on repeat assessment soon after, it did not change at all with standing. She is sitting up in bed and watching tv, still with complete resolution of headache, she appears very comfortable. I offered her admission if she feels poorly however she feels well and would like to go home. We discussed return precautions, need for close PCP follow-up and she is comfortable with the discharge plan. DC home.  ____________________________________________   FINAL CLINICAL IMPRESSION(S) / ED DIAGNOSES  Final diagnoses:  Acute nonintractable headache, unspecified headache type  Non-intractable vomiting without nausea, unspecified vomiting type  Flank pain, acute      Joanne Gavel, MD 07/20/15 1954  Joanne Gavel, MD 07/20/15 1955

## 2015-07-20 NOTE — ED Notes (Signed)
Pt given PO fluids. tolerating without problem

## 2015-07-25 LAB — CULTURE, BLOOD (ROUTINE X 2)
CULTURE: NO GROWTH
CULTURE: NO GROWTH

## 2015-08-19 ENCOUNTER — Emergency Department: Payer: Medicare Other

## 2015-08-19 ENCOUNTER — Encounter: Payer: Self-pay | Admitting: Emergency Medicine

## 2015-08-19 ENCOUNTER — Emergency Department
Admission: EM | Admit: 2015-08-19 | Discharge: 2015-08-19 | Disposition: A | Discharge status: Home / Self Care | Payer: Medicare Other | Attending: Emergency Medicine | Admitting: Emergency Medicine

## 2015-08-19 DIAGNOSIS — E039 Hypothyroidism, unspecified: Secondary | ICD-10-CM | POA: Insufficient documentation

## 2015-08-19 DIAGNOSIS — F319 Bipolar disorder, unspecified: Secondary | ICD-10-CM | POA: Diagnosis not present

## 2015-08-19 DIAGNOSIS — Z8673 Personal history of transient ischemic attack (TIA), and cerebral infarction without residual deficits: Secondary | ICD-10-CM | POA: Diagnosis not present

## 2015-08-19 DIAGNOSIS — E785 Hyperlipidemia, unspecified: Secondary | ICD-10-CM | POA: Diagnosis not present

## 2015-08-19 DIAGNOSIS — Z8541 Personal history of malignant neoplasm of cervix uteri: Secondary | ICD-10-CM | POA: Diagnosis not present

## 2015-08-19 DIAGNOSIS — Z87891 Personal history of nicotine dependence: Secondary | ICD-10-CM | POA: Diagnosis not present

## 2015-08-19 DIAGNOSIS — R55 Syncope and collapse: Secondary | ICD-10-CM | POA: Diagnosis present

## 2015-08-19 DIAGNOSIS — I10 Essential (primary) hypertension: Secondary | ICD-10-CM | POA: Diagnosis not present

## 2015-08-19 LAB — BASIC METABOLIC PANEL
ANION GAP: 9 (ref 5–15)
BUN: 12 mg/dL (ref 6–20)
CALCIUM: 8.6 mg/dL — AB (ref 8.9–10.3)
CO2: 27 mmol/L (ref 22–32)
Chloride: 98 mmol/L — ABNORMAL LOW (ref 101–111)
Creatinine, Ser: 1.02 mg/dL — ABNORMAL HIGH (ref 0.44–1.00)
GFR calc Af Amer: >60 mL/min (ref >60)
Glucose, Bld: 94 mg/dL (ref 65–99)
POTASSIUM: 3.6 mmol/L (ref 3.5–5.1)
SODIUM: 134 mmol/L — AB (ref 135–145)

## 2015-08-19 LAB — CBC WITH DIFFERENTIAL/PLATELET
BASOS PCT: 1 %
Basophils Absolute: 0.1 10*3/uL (ref 0–0.1)
EOS ABS: 0 10*3/uL (ref 0–0.7)
Eosinophils Relative: 1 %
HCT: 38.6 % (ref 35.0–47.0)
HEMOGLOBIN: 13.4 g/dL (ref 12.0–16.0)
LYMPHS PCT: 38 %
Lymphs Abs: 1.9 10*3/uL (ref 1.0–3.6)
MCH: 30.5 pg (ref 26.0–34.0)
MCHC: 34.7 g/dL (ref 32.0–36.0)
MCV: 87.9 fL (ref 80.0–100.0)
MONO ABS: 0.4 10*3/uL (ref 0.2–0.9)
MONOS PCT: 7 %
NEUTROS ABS: 2.7 10*3/uL (ref 1.4–6.5)
Neutrophils Relative %: 53 %
PLATELETS: 247 10*3/uL (ref 150–440)
RBC: 4.4 MIL/uL (ref 3.80–5.20)
RDW: 13.1 % (ref 11.5–14.5)
WBC: 5.1 10*3/uL (ref 3.6–11.0)

## 2015-08-19 LAB — FIBRIN DERIVATIVES D-DIMER (ARMC ONLY): FIBRIN DERIVATIVES D-DIMER (ARMC): 340 (ref 0–499)

## 2015-08-19 LAB — TROPONIN I

## 2015-08-19 MED ORDER — SODIUM CHLORIDE 0.9 % IV BOLUS (SEPSIS)
500.0000 mL | Freq: Once | INTRAVENOUS | Status: AC
  Administered 2015-08-19: 500 mL via INTRAVENOUS

## 2015-08-19 MED ORDER — BUTALBITAL-APAP-CAFFEINE 50-325-40 MG PO TABS
1.0000 | ORAL_TABLET | Freq: Once | ORAL | Status: AC
  Administered 2015-08-19: 1 via ORAL
  Filled 2015-08-19: qty 1

## 2015-08-19 NOTE — ED Notes (Addendum)
Pt arrived via EMS from home after syncopal episode.  Pt states she when she stood up she passed out.  Denies any lightheadedness or dizziness prior to passing out.  Patient states she is on several BP medications at this time one of which was just lowered by her PCP.  Per EMS they had difficulty obtaining BP and once they did she was 70 systolic palpated.  Pt placed in Trendelenberg and pressure up to 150/105.  Patient is pale, but alert and oriented at this time.  Pt c/o pain in the back of the head where she fell onto the floor.

## 2015-08-19 NOTE — ED Notes (Signed)
Patient transported to X-ray and CT 

## 2015-08-19 NOTE — ED Provider Notes (Signed)
Time Seen: Approximately 1818 I have reviewed the triage notes  Chief Complaint: Loss of Consciousness   History of Present Illness: Robin Jensen is a 55 y.o. female who presents via EMS for what she described as a near syncopal episode. Patient states she feels lightheaded whenever she stands up he denies any chest pain. She denies any shortness of breath. She states she is on several blood pressure medications and was just recently lowered on her blood pressure medication by her primary physician. She states she's had similar episode before corrected with IV fluids. She denies any leg pain, calf tenderness, or swelling. When the patient was placed in Trendelenburg position and her blood pressure correction from 70 systolic to up to [id number] systolic. She states she's having acute exacerbation of some chronic headaches. She takes normally furious that at home for her chronic headaches. She denies any change in the intensity, location, or characteristics of this headache over her other headaches that she's had before in the past.   Past Medical History  Diagnosis Date  . Cancer ([place])     uterine  . Hypertension   . Bipolar 1 disorder ([place])   . Hyperlipidemia   . GERD (gastroesophageal reflux disease)   . Hypothyroid   . Seizure disorder ([place])   . Migraine   . Anxiety   . Depression   . Suicidal ideation     attempted overdoses in past  . Stroke ([place]) 1997    No deficits  . Seizures ([place])     last year last seizure-had brain aneurysm. ON meds  . DVT of leg (deep venous thrombosis) ([place])     history of    Patient Active Problem List   Diagnosis Date Noted  . Aneurysm, cerebral, nonruptured 08/14/2012  . Unspecified hypothyroidism 08/14/2012  . Other and unspecified hyperlipidemia 08/14/2012  . Seizures ([place]) 08/14/2012  . Insomnia 08/14/2012  . Unspecified constipation 08/07/2012  . FHx: colon cancer 08/07/2012  . Right sided abdominal pain 08/07/2012  . Essential  hypertension, benign 08/07/2012  . Abnormal LFTs 08/07/2012    Past Surgical History  Procedure Laterality Date  . Radical hysterectomy    . Cholecystectomy    . Cerebral aneurysm repair  1997  . Colonoscopy with propofol N/A 08/20/2012    Procedure: COLONOSCOPY WITH PROPOFOL;  Surgeon: [name], MD;  Location: [facility];  Service: Endoscopy;  Laterality: N/A;  In cecum at [place]; Total Withdrawal Time= 12 minutes    Past Surgical History  Procedure Laterality Date  . Radical hysterectomy    . Cholecystectomy    . Cerebral aneurysm repair  1997  . Colonoscopy with propofol N/A 08/20/2012    Procedure: COLONOSCOPY WITH PROPOFOL;  Surgeon: [name], MD;  Location: [facility];  Service: Endoscopy;  Laterality: N/A;  In cecum at [place]; Total Withdrawal Time= 12 minutes    Current Outpatient Rx  Name  Route  Sig  Dispense  Refill  . ARIPiprazole (ABILIFY) 5 MG tablet   Oral   Take 5 mg by mouth daily.         Marland Kitchen atorvastatin (LIPITOR) 40 MG tablet   Oral   Take 40 mg by mouth daily.         . diazepam (VALIUM) 5 MG tablet   Oral   Take 5 mg by mouth 3 (three) times daily.         Marland Kitchen FLUoxetine (PROZAC) 40 MG capsule      40 mg  daily.          . furosemide (LASIX) 40 MG tablet   Oral   Take 1 tablet (40 mg total) by mouth daily.   30 tablet   5   . HYDROcodone-acetaminophen (NORCO/VICODIN) 5-325 MG tablet   Oral   Take 1 tablet by mouth every 4 (four) hours as needed for moderate pain.   15 tablet   0   . hydrOXYzine (ATARAX/VISTARIL) 25 MG tablet   Oral   Take 25 mg by mouth 3 (three) times daily as needed.         . labetalol (NORMODYNE) 200 MG tablet   Oral   Take 400 mg by mouth 2 (two) times daily.         Marland Kitchen levothyroxine (SYNTHROID, LEVOTHROID) 75 MCG tablet   Oral   Take 1 tablet (75 mcg total) by mouth daily before breakfast.   30 tablet   8   . losartan (COZAAR) 50 MG tablet   Oral   Take 1 tablet (50 mg total) by mouth daily.   30  tablet   0     **PATIENT NEEDS APPOINTMENT**   . losartan-hydrochlorothiazide (HYZAAR) 100-25 MG per tablet   Oral   Take 1 tablet by mouth daily.   90 tablet   3   . metolazone (ZAROXOLYN) 2.5 MG tablet      Take 1/2 hour prior to lasix for 2-3 days prn swelling   3 tablet   3     Please deliver   . mirtazapine (REMERON) 30 MG tablet   Oral   Take 30 mg by mouth at bedtime.         . ondansetron (ZOFRAN ODT) 4 MG disintegrating tablet   Oral   Take 1 tablet (4 mg total) by mouth every 8 (eight) hours as needed for nausea or vomiting.   20 tablet   0   . phenytoin (DILANTIN) 100 MG ER capsule   Oral   Take 200 mg by mouth 2 (two) times daily.         . polyethylene glycol (MIRALAX / GLYCOLAX) packet   Oral   Take 17 g by mouth daily.         . prazosin (MINIPRESS) 2 MG capsule      2 mg.          . promethazine (PHENERGAN) 12.5 MG tablet   Oral   Take 1 tablet (12.5 mg total) by mouth every 8 (eight) hours as needed for nausea or vomiting.   12 tablet   0   . terbinafine (LAMISIL) 250 MG tablet      250 mg.          . thiothixene (NAVANE) 10 MG capsule   Oral   Take 20 mg by mouth at bedtime as needed.          . tolterodine (DETROL LA) 2 MG 24 hr capsule   Oral   Take 1 capsule (2 mg total) by mouth daily.   30 capsule   5   . topiramate (TOPAMAX) 50 MG tablet   Oral   Take 50 mg by mouth daily.         . trihexyphenidyl (ARTANE) 5 MG tablet   Oral   Take 5 mg by mouth 2 (two) times daily with a meal.         . Verapamil HCl CR 300 MG CP24   Oral   Take 1 capsule (300  mg total) by mouth daily.   30 each   5   . zolpidem (AMBIEN) 10 MG tablet   Oral   Take 10 mg by mouth at bedtime as needed for sleep.           Allergies:  Penicillins; Latex; Lisinopril; and Sulfa antibiotics  Family History: Family History  Problem Relation Age of Onset  . Colon cancer Father     less than age 28  . Colon cancer Paternal  Uncle     57s  . Colon cancer Paternal Grandfather     Social History: Social History  Substance Use Topics  . Smoking status: Former Smoker    Quit date: 08/15/2005  . Smokeless tobacco: None  . 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 Patient denies any melena or hematochezia Endocrine: No weight loss, No night sweats Extremities: No peripheral edema, cyanosis Skin: No rashes, easy bruising Neurologic: No focal weakness, trouble with speech or swollowing Urologic: No dysuria, Hematuria, or urinary frequency   Physical Exam:  ED Triage Vitals  Enc Vitals Group     BP 08/19/15 1816 110/78 mmHg     Pulse Rate 08/19/15 1816 76     Resp 08/19/15 1816 20     Temp 08/19/15 1816 97.9 F (36.6 C)     Temp Source 08/19/15 1816 Oral     SpO2 08/19/15 1816 96 %     Weight 08/19/15 1816 171 lb (77.565 kg)     Height 08/19/15 1816 5\' 1"  (1.549 m)     Head Cir --      Peak Flow --      Pain Score 08/19/15 1816 9     Pain Loc --      Pain Edu? --      Excl. in [place]? --     General: Awake , Alert , and Oriented times 3; GCS 15 Head: Normal cephalic , atraumatic Eyes: Pupils equal , round, reactive to light Nose/Throat: No nasal drainage, patent upper airway without erythema or exudate.  Neck: Supple, Full range of motion, No anterior adenopathy or palpable thyroid masses Lungs: Clear to ascultation without wheezes , rhonchi, or rales Heart: Regular rate, regular rhythm without murmurs , gallops , or rubs Abdomen: Soft, non tender without rebound, guarding , or rigidity; bowel sounds positive and symmetric in all 4 quadrants. No organomegaly .         Extremities: 2 plus symmetric pulses. No edema, clubbing or cyanosis Neurologic: normal ambulation, Motor symmetric without deficits, sensory intact Skin: warm, dry, no rashes   Labs:   All laboratory work was reviewed including any pertinent negatives or positives listed below:  Labs Reviewed  [place] - Abnormal; Notable for the following:    Sodium 134 (*)    Chloride 98 (*)    Creatinine, Ser 1.02 (*)    Calcium 8.6 (*)    All other components within normal limits  CBC WITH DIFFERENTIAL/PLATELET  FIBRIN DERIVATIVES D-DIMER ([facility] ONLY)  TROPONIN I    EKG:  ED ECG REPORT I, Daymon Larsen, the attending physician, personally viewed and interpreted this ECG.  Date: 08/19/2015  EKG Time: 1812 Rate: 72 Rhythm: normal sinus rhythm QRS Axis: normal Intervals: normal ST/T Wave abnormalities: Nonspecific T wave abnormalities Conduction Disturbances: none Narrative Interpretation: unremarkable No acute ischemic changes  Radiology:   EXAM: CT HEAD WITHOUT CONTRAST  TECHNIQUE: Contiguous axial images were obtained from the base of the skull through the vertex without intravenous contrast.  COMPARISON: 06/18/2014.  FINDINGS: No evidence of an acute infarct, acute hemorrhage, mass lesion, mass effect or hydrocephalus. Right temporal craniectomy and aneurysm clipping. Visualized portions of the paranasal sinuses and mastoid air cells are clear.  IMPRESSION: 1. No acute findings. 2. Right temporal craniectomy for aneurysm clipping.   Electronically Signed By: Lorin Picket M.D. On: 08/19/2015 19:12          DG Chest 2 View (Final result) Result time: 08/19/15 18:56:55   Final result by Rad Results In Interface (08/19/15 18:56:55)   Narrative:   CLINICAL DATA: Syncope.  EXAM: CHEST 2 VIEW  COMPARISON: Report from 05/25/2013  FINDINGS: The lungs appear clear. Cardiac and mediastinal contours normal.  No pleural effusion  identified.  Old deformity of the right sixth rib, likely from a prior fracture.  Mild thoracic spondylosis.  IMPRESSION: 1. No acute findings in the chest to explain the patient's syncope.       I personally reviewed the radiologic studies    ED Course: ** Patient's stay here was uneventful she was given IV fluids and her hypotension corrected itself. Orthostatics are negative. She was advised continue with her current medication though to discuss these medications and dosing with her primary physician is following for hypertension. She was advised drink plenty of fluids and return here if she has any chest pain or syncopal episode.   Assessment:  Near syncope Hypotension  Final Clinical Impression: *  Final diagnoses:  Near syncope     Plan:  Outpatient management Patient was advised to return immediately if condition worsens. Patient was advised to follow up with their primary care physician or other specialized physicians involved in their outpatient care. The patient and/or family member/power of attorney had laboratory results reviewed at the bedside. All questions and concerns were addressed and appropriate discharge instructions were distributed by the nursing staff.            Daymon Larsen, MD 08/19/15 2217

## 2015-08-19 NOTE — Discharge Instructions (Signed)

## 2015-09-02 ENCOUNTER — Other Ambulatory Visit: Payer: Self-pay | Admitting: Family Medicine

## 2015-09-02 DIAGNOSIS — Z1231 Encounter for screening mammogram for malignant neoplasm of breast: Secondary | ICD-10-CM

## 2015-09-13 ENCOUNTER — Other Ambulatory Visit: Payer: Self-pay | Admitting: Family Medicine

## 2015-09-13 ENCOUNTER — Ambulatory Visit
Admission: RE | Admit: 2015-09-13 | Discharge: 2015-09-13 | Disposition: A | Discharge status: Home / Self Care | Payer: Medicare Other | Source: Ambulatory Visit | Attending: Family Medicine | Admitting: Family Medicine

## 2015-09-13 DIAGNOSIS — Z1231 Encounter for screening mammogram for malignant neoplasm of breast: Secondary | ICD-10-CM

## 2015-09-16 ENCOUNTER — Other Ambulatory Visit: Payer: Self-pay | Admitting: Family Medicine

## 2015-09-16 DIAGNOSIS — R928 Other abnormal and inconclusive findings on diagnostic imaging of breast: Secondary | ICD-10-CM

## 2015-09-29 ENCOUNTER — Ambulatory Visit
Admission: RE | Admit: 2015-09-29 | Discharge: 2015-09-29 | Disposition: A | Discharge status: Home / Self Care | Payer: Medicare Other | Source: Ambulatory Visit | Attending: Family Medicine | Admitting: Family Medicine

## 2015-09-29 DIAGNOSIS — R928 Other abnormal and inconclusive findings on diagnostic imaging of breast: Secondary | ICD-10-CM

## 2015-09-29 DIAGNOSIS — N6489 Other specified disorders of breast: Secondary | ICD-10-CM | POA: Insufficient documentation

## 2015-09-29 DIAGNOSIS — N63 Unspecified lump in breast: Secondary | ICD-10-CM | POA: Diagnosis present

## 2015-10-20 ENCOUNTER — Other Ambulatory Visit: Payer: Self-pay | Admitting: Gastroenterology

## 2015-10-20 DIAGNOSIS — R1310 Dysphagia, unspecified: Secondary | ICD-10-CM

## 2015-10-28 ENCOUNTER — Ambulatory Visit
Admission: RE | Admit: 2015-10-28 | Discharge: 2015-10-28 | Disposition: A | Discharge status: Home / Self Care | Payer: Medicare Other | Source: Ambulatory Visit | Attending: Gastroenterology | Admitting: Gastroenterology

## 2015-10-28 DIAGNOSIS — R1310 Dysphagia, unspecified: Secondary | ICD-10-CM | POA: Diagnosis present

## 2015-10-28 DIAGNOSIS — R935 Abnormal findings on diagnostic imaging of other abdominal regions, including retroperitoneum: Secondary | ICD-10-CM | POA: Insufficient documentation

## 2015-11-25 ENCOUNTER — Encounter: Payer: Self-pay | Admitting: *Deleted

## 2015-11-26 ENCOUNTER — Encounter: Payer: Self-pay | Admitting: Anesthesiology

## 2015-11-26 ENCOUNTER — Ambulatory Visit: Admission: RE | Admit: 2015-11-26 | Payer: Medicare Other | Source: Ambulatory Visit | Admitting: Gastroenterology

## 2015-11-26 SURGERY — ESOPHAGOGASTRODUODENOSCOPY (EGD) WITH PROPOFOL
Anesthesia: General

## 2016-01-06 ENCOUNTER — Other Ambulatory Visit: Payer: Self-pay | Admitting: Family Medicine

## 2016-01-06 ENCOUNTER — Ambulatory Visit: Payer: Medicare Other

## 2016-01-06 DIAGNOSIS — S0990XA Unspecified injury of head, initial encounter: Secondary | ICD-10-CM

## 2016-01-06 DIAGNOSIS — Z8249 Family history of ischemic heart disease and other diseases of the circulatory system: Secondary | ICD-10-CM

## 2016-01-07 ENCOUNTER — Emergency Department: Payer: Medicare Other

## 2016-01-07 ENCOUNTER — Encounter: Payer: Self-pay | Admitting: Emergency Medicine

## 2016-01-07 ENCOUNTER — Emergency Department
Admission: EM | Admit: 2016-01-07 | Discharge: 2016-01-07 | Disposition: A | Discharge status: Home / Self Care | Payer: Medicare Other | Attending: Student in an Organized Health Care Education/Training Program | Admitting: Student in an Organized Health Care Education/Training Program

## 2016-01-07 DIAGNOSIS — Y999 Unspecified external cause status: Secondary | ICD-10-CM | POA: Insufficient documentation

## 2016-01-07 DIAGNOSIS — R112 Nausea with vomiting, unspecified: Secondary | ICD-10-CM | POA: Insufficient documentation

## 2016-01-07 DIAGNOSIS — R519 Headache, unspecified: Secondary | ICD-10-CM

## 2016-01-07 DIAGNOSIS — R51 Headache: Secondary | ICD-10-CM

## 2016-01-07 DIAGNOSIS — Z79899 Other long term (current) drug therapy: Secondary | ICD-10-CM | POA: Insufficient documentation

## 2016-01-07 DIAGNOSIS — Z8541 Personal history of malignant neoplasm of cervix uteri: Secondary | ICD-10-CM | POA: Insufficient documentation

## 2016-01-07 DIAGNOSIS — S0012XA Contusion of left eyelid and periocular area, initial encounter: Secondary | ICD-10-CM | POA: Diagnosis not present

## 2016-01-07 DIAGNOSIS — Y939 Activity, unspecified: Secondary | ICD-10-CM | POA: Diagnosis not present

## 2016-01-07 DIAGNOSIS — W1839XA Other fall on same level, initial encounter: Secondary | ICD-10-CM | POA: Insufficient documentation

## 2016-01-07 DIAGNOSIS — S0083XA Contusion of other part of head, initial encounter: Secondary | ICD-10-CM | POA: Diagnosis not present

## 2016-01-07 DIAGNOSIS — Y929 Unspecified place or not applicable: Secondary | ICD-10-CM | POA: Insufficient documentation

## 2016-01-07 DIAGNOSIS — E039 Hypothyroidism, unspecified: Secondary | ICD-10-CM | POA: Diagnosis not present

## 2016-01-07 DIAGNOSIS — I10 Essential (primary) hypertension: Secondary | ICD-10-CM | POA: Diagnosis not present

## 2016-01-07 DIAGNOSIS — S0990XA Unspecified injury of head, initial encounter: Secondary | ICD-10-CM | POA: Diagnosis present

## 2016-01-07 MED ORDER — DEXAMETHASONE 4 MG PO TABS
10.0000 mg | ORAL_TABLET | Freq: Once | ORAL | Status: AC
  Administered 2016-01-07: 10 mg via ORAL
  Filled 2016-01-07: qty 2.5

## 2016-01-07 MED ORDER — DIPHENHYDRAMINE HCL 50 MG/ML IJ SOLN
25.0000 mg | Freq: Once | INTRAMUSCULAR | Status: AC
  Administered 2016-01-07: 25 mg via INTRAVENOUS
  Filled 2016-01-07: qty 1

## 2016-01-07 MED ORDER — ACETAMINOPHEN 500 MG PO TABS
1000.0000 mg | ORAL_TABLET | Freq: Once | ORAL | Status: AC
  Administered 2016-01-07: 1000 mg via ORAL
  Filled 2016-01-07: qty 2

## 2016-01-07 MED ORDER — PROMETHAZINE HCL 12.5 MG PO TABS: 12.5000 mg | ORAL_TABLET | Freq: Four times a day (QID) | ORAL | 0 refills | Status: DC | PRN

## 2016-01-07 MED ORDER — METOCLOPRAMIDE HCL 5 MG/ML IJ SOLN
10.0000 mg | Freq: Once | INTRAMUSCULAR | Status: AC
  Administered 2016-01-07: 10 mg via INTRAVENOUS
  Filled 2016-01-07: qty 2

## 2016-01-07 MED ORDER — BUTALBITAL-APAP-CAFFEINE 50-325-40 MG PO TABS: 1.0000 | ORAL_TABLET | Freq: Four times a day (QID) | ORAL | 0 refills | Status: AC | PRN

## 2016-01-07 MED ORDER — HALOPERIDOL LACTATE 5 MG/ML IJ SOLN
2.0000 mg | Freq: Once | INTRAMUSCULAR | Status: AC
  Administered 2016-01-07: 2 mg via INTRAVENOUS
  Filled 2016-01-07: qty 1

## 2016-01-07 NOTE — ED Notes (Signed)
Patient transported to CT 

## 2016-01-07 NOTE — ED Provider Notes (Signed)
[facility] Emergency Department Provider Note    First MD Initiated Contact with Patient 01/07/16 [phone]     (approximate)  I have reviewed the triage vital signs and the nursing notes.   HISTORY  Chief Complaint Fall and Headache    HPI Robin Jensen is a 55 y.o. female  who presents with gradual onset headache in the left side associated with nausea and vomiting this morning status post a fall from standing with head injury on Sunday. Patient states she does have a history of recurrent migraine headaches and feels that this is similar to previous. This is not the worst headache of her life. Did not have a sudden onset headache. Denies any fevers. Denies any other associated numbness or tingling. No other chest or abdominal pain. Patient was able to ambulate into the ED without any discomfort. Has not tried anything over-the-counter for her headache.   Past Medical History:  Diagnosis Date  . Anxiety   . Bipolar 1 disorder ([place])   . Cancer ([place])    uterine  . Depression   . DVT of leg (deep venous thrombosis) ([facility])    history of  . GERD (gastroesophageal reflux disease)   . Hyperlipidemia   . Hypertension   . Hypothyroid   . Migraine   . Seizure disorder ([place])   . Seizures ([place])    last year last seizure-had brain aneurysm. ON meds  . Stroke ([place]) 1997   No deficits  . Suicidal ideation    attempted overdoses in past    Patient Active Problem List   Diagnosis Date Noted  . Aneurysm, cerebral, nonruptured 08/14/2012  . Unspecified hypothyroidism 08/14/2012  . Other and unspecified hyperlipidemia 08/14/2012  . Seizures ([place]) 08/14/2012  . Insomnia 08/14/2012  . Unspecified constipation 08/07/2012  . FHx: colon cancer 08/07/2012  . Right sided abdominal pain 08/07/2012  . Essential hypertension, benign 08/07/2012  . Abnormal LFTs 08/07/2012    Past Surgical History:  Procedure Laterality Date  . ABDOMINAL HYSTERECTOMY    .  CEREBRAL ANEURYSM REPAIR  1997  . CHOLECYSTECTOMY    . COLONOSCOPY WITH PROPOFOL N/A 08/20/2012   Procedure: COLONOSCOPY WITH PROPOFOL;  Surgeon: [name], MD;  Location: [facility];  Service: Endoscopy;  Laterality: N/A;  In cecum at [place]; Total Withdrawal Time= 12 minutes  . RADICAL HYSTERECTOMY      Prior to Admission medications   Medication Sig Start Date End Date Taking? Authorizing Provider  atorvastatin (LIPITOR) 40 MG tablet Take 40 mg by mouth at bedtime.    Yes Historical Provider, MD  butalbital-acetaminophen-caffeine (FIORICET, ESGIC) 50-325-40 MG tablet Take 1-2 tablets by mouth every 4 (four) hours as needed for headache.   Yes Historical Provider, MD  cyclobenzaprine (FLEXERIL) 10 MG tablet Take 5-10 mg by mouth every 8 (eight) hours as needed for muscle spasms.   Yes Historical Provider, MD  divalproex (DEPAKOTE) 250 MG DR tablet Take 750 mg by mouth at bedtime.   Yes Historical Provider, MD  EPINEPHrine 0.3 mg/0.3 mL IJ SOAJ injection Inject 0.3 mg into the muscle as needed. For anaphylactic reaction   Yes Historical Provider, MD  hydrocortisone 2.5 % cream Apply 1 application topically 2 (two) times daily.   Yes Historical Provider, MD  labetalol (NORMODYNE) 300 MG tablet Take 300 mg by mouth 2 (two) times daily.   Yes Historical Provider, MD  levothyroxine (SYNTHROID, LEVOTHROID) 75 MCG tablet Take 1 tablet (75 mcg total) by mouth daily before  breakfast. 10/03/12  Yes Lysbeth Penner, FNP  losartan (COZAAR) 50 MG tablet Take 1 tablet (50 mg total) by mouth daily. 01/02/14  Yes Minus Breeding, MD  losartan-hydrochlorothiazide (HYZAAR) 100-25 MG per tablet Take 1 tablet by mouth daily. 10/03/12  Yes Lysbeth Penner, FNP  lurasidone (LATUDA) 80 MG TABS tablet Take 80 mg by mouth 2 (two) times daily.   Yes Historical Provider, MD  omeprazole (PRILOSEC) 20 MG capsule Take 20 mg by mouth daily.   Yes Historical Provider, MD  phenytoin (DILANTIN) 100 MG ER capsule Take 200 mg by  mouth 2 (two) times daily.   Yes Historical Provider, MD  promethazine (PHENERGAN) 12.5 MG tablet Take 1 tablet (12.5 mg total) by mouth every 8 (eight) hours as needed for nausea or vomiting. 07/20/15  Yes Joanne Gavel, MD  traZODone (DESYREL) 150 MG tablet Take 150 mg by mouth at bedtime.   Yes Historical Provider, MD  Verapamil HCl CR 300 MG CP24 Take 1 capsule (300 mg total) by mouth daily. 10/03/12  Yes Lysbeth Penner, FNP  butalbital-acetaminophen-caffeine (FIORICET, ESGIC) [phone] MG tablet Take 1-2 tablets by mouth every 6 (six) hours as needed for headache. 01/07/16 01/06/17  Merlyn Lot, MD  promethazine (PHENERGAN) 12.5 MG tablet Take 1 tablet (12.5 mg total) by mouth every 6 (six) hours as needed for nausea or vomiting. 01/07/16   Merlyn Lot, MD    Allergies Penicillins; Latex; Lisinopril; and Sulfa antibiotics  Family History  Problem Relation Age of Onset  . Colon cancer Father     less than age 34  . Colon cancer Paternal Uncle     43s  . Colon cancer Paternal Grandfather   . Breast cancer Maternal Aunt     Social History Social History  Substance Use Topics  . Smoking status: Former Smoker    Quit date: 08/15/2005  . Smokeless tobacco: Never Used  . Alcohol use No     Comment: sober since 2008    Review of Systems Patient denies headaches, rhinorrhea, blurry vision, numbness, shortness of breath, chest pain, edema, cough, abdominal pain, nausea, vomiting, diarrhea, dysuria, fevers, rashes or hallucinations unless otherwise stated above in HPI. ____________________________________________   PHYSICAL EXAM:  VITAL SIGNS: Vitals:   01/07/16 1100 01/07/16 1200  BP: (!) 140/98 (!) 121/101  Pulse: (!) 54 (!) 56  Resp: 18 16  Temp:      Constitutional: Alert and oriented. Well appearing and in no acute distress. Eyes: Conjunctivae are normal. PERRL. EOMI. Head: Contusion and mild swelling to the left temple and periorbital region. No proptosis Nose:  No congestion/rhinnorhea. Mouth/Throat: Mucous membranes are moist.  Oropharynx non-erythematous. Neck: No stridor. Painless ROM. No cervical spine tenderness to palpation Hematological/Lymphatic/Immunilogical: No cervical lymphadenopathy. Cardiovascular: Normal rate, regular rhythm. Grossly normal heart sounds.  Good peripheral circulation. Respiratory: Normal respiratory effort.  No retractions. Lungs CTAB. Gastrointestinal: Soft and nontender. No distention. No abdominal bruits. No CVA tenderness. Genitourinary:  Musculoskeletal: No lower extremity tenderness nor edema.  No joint effusions. Neurologic:  CN- intact.  No facial droop, Normal FNF.  Normal heel to shin.  Sensation intact bilaterally. Normal speech and language. No gross focal neurologic deficits are appreciated. No gait instability. Skin:  Skin is warm, dry and intact. No rash noted. Psychiatric: Mood and affect are normal. Speech and behavior are normal.  ____________________________________________   LABS (all labs ordered are listed, but only abnormal results are displayed)  No results found for this or any previous visit (  from the past 24 hour(s)). ____________________________________________  EK____________________________________________  RADIOLOGY  CT head with NAICA ____________________________________________   PROCEDURES  Procedure(s) performed: none    Critical Care performed: no ____________________________________________   INITIAL IMPRESSION / ASSESSMENT AND PLAN / ED COURSE  Pertinent labs & imaging results that were available during my care of the patient were reviewed by me and considered in my medical decision making (see chart for details).  DDX  Tissue headache, migraine, subdural, typical hemorrhage, fracture, concussion  Robin Jensen is a 55 y.o. who presents to the ED with with Hx of chronic migraines and aneurysm s/p clipping p/w HA for last 24 hours. Not worst HA ever.  Gradual onset. HA similar to previous episodes. Denies focal neurologic symptoms. No fevers or neck pain. No vision loss. Afebrile in ED. VSS. Exam as above. No meningeal signs. No CN, motor, sensory or cerebellar deficits. Temporal arteries palpable and non-tender. Appears well and non-toxic.  Will provide IV fluids for hydration and IV medications for symptom control.  Likely tension, non-specific or possible migraine HA. Clinical picture is not consistent with ICH, SAH, SDH, EDH, TIA, or CVA. No concern for meningitis or encephalitis. No concern for GCA/Temporal arteritis.   Will order CT head due to trauma to eval for hemhorrage.  Clinical Course  Comment By Time  CT head shows no acute intracranial abnormality. Merlyn Lot, MD 09/15 1054  Patient reassessed. No neuro deficits. Patient hemodynamic stable. Again no meningeal signs.  Able to ambulate with steady gait and able to tolerate oral medications. Patient states she does have mild headache but it has improved. Will discharge patient with Phenergan and follow up with primary care physician. Merlyn Lot, MD 09/15 1126     ____________________________________________   FINAL CLINICAL IMPRESSION(S) / ED DIAGNOSES  Final diagnoses:  Acute nonintractable headache, unspecified headache type      NEW MEDICATIONS STARTED DURING THIS VISIT:  Discharge Medication List as of 01/07/2016 11:31 AM    START taking these medications   Details  !! butalbital-acetaminophen-caffeine (FIORICET, ESGIC) 50-325-40 MG tablet Take 1-2 tablets by mouth every 6 (six) hours as needed for headache., Starting Fri 01/07/2016, Until Sat 01/06/2017, Print    !! promethazine (PHENERGAN) 12.5 MG tablet Take 1 tablet (12.5 mg total) by mouth every 6 (six) hours as needed for nausea or vomiting., Starting Fri 01/07/2016, Print     !! - Potential duplicate medications found. Please discuss with provider.       Note:  This document was prepared using  Dragon voice recognition software and may include unintentional dictation errors.    Merlyn Lot, MD 01/07/16 [phone]

## 2016-01-07 NOTE — Discharge Instructions (Signed)
Please return for worsening headache, neck pain, fevers, numbness or tingling.

## 2016-01-07 NOTE — ED Triage Notes (Signed)
Pt fell last Saturday night and has been suffering from headaches and nausea.

## 2016-01-11 ENCOUNTER — Ambulatory Visit: Payer: Medicare Other

## 2016-01-28 DIAGNOSIS — F410 Panic disorder [episodic paroxysmal anxiety] without agoraphobia: Secondary | ICD-10-CM | POA: Insufficient documentation

## 2016-01-28 DIAGNOSIS — F603 Borderline personality disorder: Secondary | ICD-10-CM | POA: Insufficient documentation

## 2016-01-28 DIAGNOSIS — F4000 Agoraphobia, unspecified: Secondary | ICD-10-CM | POA: Insufficient documentation

## 2016-01-28 DIAGNOSIS — F250 Schizoaffective disorder, bipolar type: Secondary | ICD-10-CM | POA: Insufficient documentation

## 2016-01-28 DIAGNOSIS — R296 Repeated falls: Secondary | ICD-10-CM | POA: Insufficient documentation

## 2016-01-28 DIAGNOSIS — W19XXXA Unspecified fall, initial encounter: Secondary | ICD-10-CM | POA: Insufficient documentation

## 2016-01-28 DIAGNOSIS — R6889 Other general symptoms and signs: Secondary | ICD-10-CM | POA: Insufficient documentation

## 2016-02-15 DIAGNOSIS — F0280 Dementia in other diseases classified elsewhere without behavioral disturbance: Secondary | ICD-10-CM | POA: Insufficient documentation

## 2016-02-15 DIAGNOSIS — G40A09 Absence epileptic syndrome, not intractable, without status epilepticus: Secondary | ICD-10-CM | POA: Insufficient documentation

## 2016-02-28 ENCOUNTER — Other Ambulatory Visit: Payer: Self-pay | Admitting: Family Medicine

## 2016-02-28 DIAGNOSIS — R928 Other abnormal and inconclusive findings on diagnostic imaging of breast: Secondary | ICD-10-CM

## 2016-04-04 ENCOUNTER — Other Ambulatory Visit: Payer: Self-pay

## 2016-04-04 ENCOUNTER — Ambulatory Visit: Payer: Medicare Other

## 2016-04-28 ENCOUNTER — Ambulatory Visit: Payer: Medicare Other

## 2016-04-28 ENCOUNTER — Other Ambulatory Visit: Payer: Self-pay

## 2016-05-18 ENCOUNTER — Ambulatory Visit
Admission: RE | Admit: 2016-05-18 | Discharge: 2016-05-18 | Disposition: A | Discharge status: Home / Self Care | Payer: Medicare Other | Source: Ambulatory Visit | Attending: Family Medicine | Admitting: Family Medicine

## 2016-05-18 DIAGNOSIS — N6489 Other specified disorders of breast: Secondary | ICD-10-CM | POA: Insufficient documentation

## 2016-05-18 DIAGNOSIS — R928 Other abnormal and inconclusive findings on diagnostic imaging of breast: Secondary | ICD-10-CM | POA: Insufficient documentation

## 2016-11-17 ENCOUNTER — Encounter: Payer: Self-pay | Admitting: *Deleted

## 2016-11-20 ENCOUNTER — Encounter
Admission: RE | Disposition: A | Discharge status: Home / Self Care | Payer: Self-pay | Source: Ambulatory Visit | Attending: Gastroenterology

## 2016-11-20 ENCOUNTER — Ambulatory Visit: Payer: Medicare Other | Admitting: Anesthesiology

## 2016-11-20 ENCOUNTER — Ambulatory Visit
Admission: RE | Admit: 2016-11-20 | Discharge: 2016-11-20 | Disposition: A | Discharge status: Home / Self Care | Payer: Medicare Other | Source: Ambulatory Visit | Attending: Gastroenterology | Admitting: Gastroenterology

## 2016-11-20 DIAGNOSIS — Z5309 Procedure and treatment not carried out because of other contraindication: Secondary | ICD-10-CM | POA: Diagnosis not present

## 2016-11-20 DIAGNOSIS — K259 Gastric ulcer, unspecified as acute or chronic, without hemorrhage or perforation: Secondary | ICD-10-CM | POA: Diagnosis not present

## 2016-11-20 DIAGNOSIS — F319 Bipolar disorder, unspecified: Secondary | ICD-10-CM | POA: Diagnosis not present

## 2016-11-20 DIAGNOSIS — G43909 Migraine, unspecified, not intractable, without status migrainosus: Secondary | ICD-10-CM | POA: Insufficient documentation

## 2016-11-20 DIAGNOSIS — K319 Disease of stomach and duodenum, unspecified: Secondary | ICD-10-CM | POA: Insufficient documentation

## 2016-11-20 DIAGNOSIS — Z880 Allergy status to penicillin: Secondary | ICD-10-CM | POA: Insufficient documentation

## 2016-11-20 DIAGNOSIS — I10 Essential (primary) hypertension: Secondary | ICD-10-CM | POA: Insufficient documentation

## 2016-11-20 DIAGNOSIS — I739 Peripheral vascular disease, unspecified: Secondary | ICD-10-CM | POA: Insufficient documentation

## 2016-11-20 DIAGNOSIS — R1013 Epigastric pain: Secondary | ICD-10-CM | POA: Insufficient documentation

## 2016-11-20 DIAGNOSIS — Z79899 Other long term (current) drug therapy: Secondary | ICD-10-CM | POA: Insufficient documentation

## 2016-11-20 DIAGNOSIS — Z8542 Personal history of malignant neoplasm of other parts of uterus: Secondary | ICD-10-CM | POA: Diagnosis not present

## 2016-11-20 DIAGNOSIS — K219 Gastro-esophageal reflux disease without esophagitis: Secondary | ICD-10-CM | POA: Insufficient documentation

## 2016-11-20 DIAGNOSIS — E039 Hypothyroidism, unspecified: Secondary | ICD-10-CM | POA: Insufficient documentation

## 2016-11-20 DIAGNOSIS — G40909 Epilepsy, unspecified, not intractable, without status epilepticus: Secondary | ICD-10-CM | POA: Insufficient documentation

## 2016-11-20 DIAGNOSIS — Z86718 Personal history of other venous thrombosis and embolism: Secondary | ICD-10-CM | POA: Insufficient documentation

## 2016-11-20 DIAGNOSIS — F419 Anxiety disorder, unspecified: Secondary | ICD-10-CM | POA: Diagnosis not present

## 2016-11-20 DIAGNOSIS — Z8673 Personal history of transient ischemic attack (TIA), and cerebral infarction without residual deficits: Secondary | ICD-10-CM | POA: Insufficient documentation

## 2016-11-20 DIAGNOSIS — E785 Hyperlipidemia, unspecified: Secondary | ICD-10-CM | POA: Diagnosis not present

## 2016-11-20 HISTORY — PX: ESOPHAGOGASTRODUODENOSCOPY (EGD) WITH PROPOFOL: SHX5813

## 2016-11-20 SURGERY — ESOPHAGOGASTRODUODENOSCOPY (EGD) WITH PROPOFOL
Anesthesia: General

## 2016-11-20 MED ORDER — LIDOCAINE HCL (PF) 2 % IJ SOLN
INTRAMUSCULAR | Status: AC
  Filled 2016-11-20: qty 2

## 2016-11-20 MED ORDER — PROPOFOL 500 MG/50ML IV EMUL
INTRAVENOUS | Status: AC
  Filled 2016-11-20: qty 50

## 2016-11-20 MED ORDER — PROPOFOL 10 MG/ML IV BOLUS
INTRAVENOUS | Status: DC | PRN
  Administered 2016-11-20: 100 mg via INTRAVENOUS

## 2016-11-20 MED ORDER — SODIUM CHLORIDE 0.9 % IV SOLN: INTRAVENOUS | Status: DC

## 2016-11-20 MED ORDER — PROPOFOL 500 MG/50ML IV EMUL
INTRAVENOUS | Status: DC | PRN
  Administered 2016-11-20: 150 ug/kg/min via INTRAVENOUS

## 2016-11-20 MED ORDER — LIDOCAINE HCL (CARDIAC) 20 MG/ML IV SOLN
INTRAVENOUS | Status: DC | PRN
  Administered 2016-11-20: 100 mg via INTRAVENOUS

## 2016-11-20 MED ORDER — SODIUM CHLORIDE 0.9 % IV SOLN
INTRAVENOUS | Status: DC
  Administered 2016-11-20: 10:00:00 via INTRAVENOUS
  Administered 2016-11-20: 1000 mL via INTRAVENOUS

## 2016-11-20 NOTE — Anesthesia Postprocedure Evaluation (Signed)
Anesthesia Post Note  Patient: LAVERA VANDERMEER  Procedure(s) Performed: Procedure(s) (LRB): ESOPHAGOGASTRODUODENOSCOPY (EGD) WITH PROPOFOL (N/A)  Patient location during evaluation: Endoscopy Anesthesia Type: General Level of consciousness: awake and alert and oriented Pain management: pain level controlled Vital Signs Assessment: post-procedure vital signs reviewed and stable Respiratory status: spontaneous breathing, nonlabored ventilation and respiratory function stable Cardiovascular status: blood pressure returned to baseline and stable Postop Assessment: no signs of nausea or vomiting Anesthetic complications: no     Last Vitals:  Vitals:   11/20/16 1011 11/20/16 1021  BP: 136/83 (!) 153/98  Pulse: 64 64  Resp: 17 16  Temp:      Last Pain:  Vitals:   11/20/16 1001  TempSrc: Tympanic                 Kohlton Gilpatrick

## 2016-11-20 NOTE — Anesthesia Post-op Follow-up Note (Cosign Needed)
Anesthesia QCDR form completed.        

## 2016-11-20 NOTE — Anesthesia Procedure Notes (Signed)
Date/Time: 11/20/2016 9:43 AM Performed by: Nelda Marseille Pre-anesthesia Checklist: Patient identified, Emergency Drugs available, Suction available, Patient being monitored and Timeout performed Oxygen Delivery Method: Nasal cannula

## 2016-11-20 NOTE — H&P (Signed)
Outpatient short stay form Pre-procedure 11/20/2016 9:36 AM [name] MD  Primary Physician: Dr Tomasa Hose  Reason for visit:  EGD  History of present illness:  Patient is a 56 year old female presenting today as above. She has personal history of epigastric pain nausea and vomiting. She had a barium swallow with the impression of no evidence of esophagitis or stricture no reflux or hiatal hernia. Cervical esophagus normal. Possible subcentimeter proximal gastric body ulcer versus small diverticulum. This procedure was done on 10/28/2015. She has been on a proton pump inhibitor, Protonix 40 mg, as well as recent addition of Carafate. She has been taking both BC powders and ibuprofen. She has had none of these for about a week.    Current Facility-Administered Medications:  .  0.9 %  sodium chloride infusion, , Intravenous, Continuous, [name], MD, Last Rate: 20 mL/hr at 11/20/16 0847, 1,000 mL at 11/20/16 0847 .  0.9 %  sodium chloride infusion, , Intravenous, Continuous, [name], MD  Prescriptions Prior to Admission  Medication Sig Dispense Refill Last Dose  . atorvastatin (LIPITOR) 40 MG tablet Take 40 mg by mouth at bedtime.    11/19/2016 at Unknown time  . butalbital-acetaminophen-caffeine (FIORICET, ESGIC) 50-325-40 MG tablet Take 1-2 tablets by mouth every 4 (four) hours as needed for headache.   11/19/2016 at Unknown time  . butalbital-acetaminophen-caffeine (FIORICET, ESGIC) 50-325-40 MG tablet Take 1-2 tablets by mouth every 6 (six) hours as needed for headache. 8 tablet 0 11/19/2016 at Unknown time  . cyclobenzaprine (FLEXERIL) 10 MG tablet Take 5-10 mg by mouth every 8 (eight) hours as needed for muscle spasms.   11/19/2016 at Unknown time  . divalproex (DEPAKOTE) 250 MG DR tablet Take 750 mg by mouth at bedtime.   11/19/2016 at Unknown time  . EPINEPHrine 0.3 mg/0.3 mL IJ SOAJ injection Inject 0.3 mg into the muscle as needed. For anaphylactic reaction    11/19/2016 at Unknown time  . hydrocortisone 2.5 % cream Apply 1 application topically 2 (two) times daily.   11/19/2016 at Unknown time  . labetalol (NORMODYNE) 300 MG tablet Take 300 mg by mouth 2 (two) times daily.   11/20/2016 at 0630  . levothyroxine (SYNTHROID, LEVOTHROID) 75 MCG tablet Take 1 tablet (75 mcg total) by mouth daily before breakfast. 30 tablet 8 11/19/2016 at Unknown time  . losartan (COZAAR) 50 MG tablet Take 1 tablet (50 mg total) by mouth daily. 30 tablet 0 11/20/2016 at 0630  . losartan-hydrochlorothiazide (HYZAAR) 100-25 MG per tablet Take 1 tablet by mouth daily. 90 tablet 3 11/20/2016 at 0630  . lurasidone (LATUDA) 80 MG TABS tablet Take 80 mg by mouth 2 (two) times daily.   11/19/2016 at Unknown time  . omeprazole (PRILOSEC) 20 MG capsule Take 20 mg by mouth daily.   11/19/2016 at Unknown time  . phenytoin (DILANTIN) 100 MG ER capsule Take 200 mg by mouth 2 (two) times daily.   11/19/2016 at Unknown time  . promethazine (PHENERGAN) 12.5 MG tablet Take 1 tablet (12.5 mg total) by mouth every 8 (eight) hours as needed for nausea or vomiting. 12 tablet 0 11/19/2016 at Unknown time  . promethazine (PHENERGAN) 12.5 MG tablet Take 1 tablet (12.5 mg total) by mouth every 6 (six) hours as needed for nausea or vomiting. 12 tablet 0 11/19/2016 at Unknown time  . traZODone (DESYREL) 150 MG tablet Take 150 mg by mouth at bedtime.   11/19/2016 at Unknown time  . Verapamil HCl CR 300  MG CP24 Take 1 capsule (300 mg total) by mouth daily. 30 each 5 11/20/2016 at 0630     Allergies  Allergen Reactions  . Penicillins Shortness Of Breath and Rash  . Latex   . Lisinopril   . Sulfa Antibiotics      Past Medical History:  Diagnosis Date  . Anxiety   . Bipolar 1 disorder ([place])   . Cancer ([place])    uterine  . Depression   . DVT of leg (deep venous thrombosis) ([facility])    history of  . GERD (gastroesophageal reflux disease)   . Hyperlipidemia   . Hypertension   . Hypothyroid   . Migraine   .  Seizure disorder ([place])   . Seizures ([place])    last year last seizure-had brain aneurysm. ON meds  . Stroke ([place]) 1997   No deficits  . Suicidal ideation    attempted overdoses in past    Review of systems:      Physical Exam    Heart and lungs: Regular rate and rhythm without rub or gallop, lungs are bilaterally clear    HEENT: Normocephalic atraumatic eyes are anicteric    Other:     Pertinant exam for procedure: Soft tender to palpation throughout the epigastric region. There are no masses or rebound. Bowel sounds are positive normoactive.    Planned proceedures: EGD and indicated procedures. I have discussed the risks benefits and complications of procedures to include not limited to bleeding, infection, perforation and the risk of sedation and the patient wishes to proceed.    [name], MD Gastroenterology 11/20/2016  9:36 AM

## 2016-11-20 NOTE — Transfer of Care (Signed)
Immediate Anesthesia Transfer of Care Note  Patient: Robin Jensen  Procedure(s) Performed: Procedure(s): ESOPHAGOGASTRODUODENOSCOPY (EGD) WITH PROPOFOL (N/A)  Patient Location: PACU  Anesthesia Type:General  Level of Consciousness: sedated  Airway & Oxygen Therapy: Patient Spontanous Breathing and Patient connected to nasal cannula oxygen  Post-op Assessment: Report given to RN  Post vital signs: Reviewed and stable  Last Vitals:  Vitals:   11/20/16 0832  BP: 117/82  Pulse: 70  Resp: 16  Temp: (!) 35.8 C    Last Pain:  Vitals:   11/20/16 0832  TempSrc: Tympanic         Complications: No apparent anesthesia complications

## 2016-11-20 NOTE — Anesthesia Preprocedure Evaluation (Signed)
Anesthesia Evaluation  Patient identified by MRN, date of birth, ID band Patient awake    Reviewed: Allergy & Precautions, NPO status , Patient's Chart, lab work & pertinent test results  History of Anesthesia Complications Negative for: history of anesthetic complications  Airway Mallampati: II  TM Distance: >3 FB Neck ROM: Full    Dental  (+) Missing, Poor Dentition   Pulmonary neg sleep apnea, neg COPD, former smoker,    breath sounds clear to auscultation- rhonchi (-) wheezing      Cardiovascular hypertension, + Peripheral Vascular Disease  (-) CAD, (-) Past MI and (-) Cardiac Stents  Rhythm:Regular Rate:Normal - Systolic murmurs and - Diastolic murmurs    Neuro/Psych  Headaches, Seizures -,  PSYCHIATRIC DISORDERS Anxiety Depression Bipolar Disorder CVA, No Residual Symptoms    GI/Hepatic Neg liver ROS, GERD  ,  Endo/Other  neg diabetesHypothyroidism   Renal/GU negative Renal ROS     Musculoskeletal negative musculoskeletal ROS (+)   Abdominal (+) - obese,   Peds  Hematology negative hematology ROS (+)   Anesthesia Other Findings Past Medical History: No date: Anxiety No date: Bipolar 1 disorder ([facility]) No date: Cancer [facility])     Comment:  uterine No date: Depression No date: DVT of leg (deep venous thrombosis) ([facility])     Comment:  history of No date: GERD (gastroesophageal reflux disease) No date: Hyperlipidemia No date: Hypertension No date: Hypothyroid No date: Migraine No date: Seizure disorder ([place]) No date: Seizures ([place])     Comment:  last year last seizure-had brain aneurysm. ON meds 1997: Stroke [facility])     Comment:  No deficits No date: Suicidal ideation     Comment:  attempted overdoses in past   Reproductive/Obstetrics                             Anesthesia Physical Anesthesia Plan  ASA: III  Anesthesia Plan: General   Post-op Pain Management:    Induction:  Intravenous  PONV Risk Score and Plan: 2 and Propofol infusion  Airway Management Planned: Natural Airway  Additional Equipment:   Intra-op Plan:   Post-operative Plan:   Informed Consent: I have reviewed the patients History and Physical, chart, labs and discussed the procedure including the risks, benefits and alternatives for the proposed anesthesia with the patient or authorized representative who has indicated his/her understanding and acceptance.   Dental advisory given  Plan Discussed with: CRNA and Anesthesiologist  Anesthesia Plan Comments:         Anesthesia Quick Evaluation

## 2016-11-20 NOTE — Op Note (Signed)
[facility] Gastroenterology Patient Name: Robin Jensen Procedure Date: 11/20/2016 9:33 AM MRN: [id number] Account #: [id number] Date of Birth: 1960/10/14 Admit Type: Outpatient Age: 56 Room: [facility] ENDO ROOM 1 Gender: Female Note Status: Finalized Procedure:            Upper GI endoscopy Indications:          Epigastric abdominal pain Providers:            [name], MD Referring MD:         Ngwe A. Aycock MD (Referring MD) Medicines:            Monitored Anesthesia Care Complications:        No immediate complications. Procedure:            Pre-Anesthesia Assessment:                       - ASA Grade Assessment: III - A patient with severe                        systemic disease.                       After obtaining informed consent, the endoscope was                        passed under direct vision. Throughout the procedure,                        the patient's blood pressure, pulse, and oxygen                        saturations were monitored continuously. The Endoscope                        was introduced through the mouth, and advanced to the                        antrum of the stomach. The upper GI endoscopy was                        extremely difficult due to presence of food. The                        patient tolerated the procedure well. The procedure was                        aborted due to presence of food. Findings:      The examined esophagus was normal.      A medium amount of food (residue) was found in the gastric fundus and in       the gastric body this precluding visual evaluation.      Multiple, small non-bleeding erosions were found in the gastric antrum.       There were no stigmata of recent bleeding. Impression:           - The procedure was aborted due to presence of food.                       - Normal esophagus.                       -  A medium amount of food (residue) in the stomach.                       -  Non-bleeding erosive gastropathy.                       - No specimens collected. Recommendation:       - Repeat upper endoscopy at appointment to be scheduled                        because the preparation was poor. Procedure Code(s):    --- Professional ---                       [phone], 52, Esophagogastroduodenoscopy, flexible,                        transoral; diagnostic, including collection of                        specimen(s) by brushing or washing, when performed                        (separate procedure) Diagnosis Code(s):    --- Professional ---                       Z53.8, Procedure and treatment not carried out for                        other reasons                       K31.89, Other diseases of stomach and duodenum                       R10.13, Epigastric pain CPT copyright 2016 [employer]. All rights reserved. The codes documented in this report are preliminary and upon coder review may  be revised to meet current compliance requirements. [name], MD 11/20/2016 10:02:50 AM This report has been signed electronically. Number of Addenda: 0 Note Initiated On: 11/20/2016 9:33 AM      [facility]

## 2016-11-21 ENCOUNTER — Encounter: Payer: Self-pay | Admitting: Gastroenterology

## 2016-11-28 ENCOUNTER — Encounter: Admission: RE | Payer: Self-pay | Source: Ambulatory Visit

## 2016-11-28 ENCOUNTER — Ambulatory Visit: Admission: RE | Admit: 2016-11-28 | Payer: Medicare Other | Source: Ambulatory Visit | Admitting: Gastroenterology

## 2016-11-28 HISTORY — DX: Spontaneous ecchymoses: R23.3

## 2016-11-28 HISTORY — DX: Other skin changes: R23.8

## 2016-11-28 HISTORY — DX: Dry mouth, unspecified: R68.2

## 2016-11-28 HISTORY — DX: Aneurysm of unspecified site: I72.9

## 2016-11-28 HISTORY — DX: Dry eye syndrome of unspecified lacrimal gland: H04.129

## 2016-11-28 HISTORY — DX: Other fatigue: R53.83

## 2016-11-28 SURGERY — ESOPHAGOGASTRODUODENOSCOPY (EGD) WITH PROPOFOL
Anesthesia: General

## 2016-12-15 ENCOUNTER — Other Ambulatory Visit: Payer: Self-pay | Admitting: Family Medicine

## 2016-12-15 DIAGNOSIS — R928 Other abnormal and inconclusive findings on diagnostic imaging of breast: Secondary | ICD-10-CM

## 2017-01-17 ENCOUNTER — Ambulatory Visit
Admission: RE | Admit: 2017-01-17 | Discharge: 2017-01-17 | Disposition: A | Discharge status: Home / Self Care | Payer: Medicare Other | Source: Ambulatory Visit | Attending: Family Medicine | Admitting: Family Medicine

## 2017-01-17 DIAGNOSIS — N6489 Other specified disorders of breast: Secondary | ICD-10-CM | POA: Insufficient documentation

## 2017-01-17 DIAGNOSIS — R928 Other abnormal and inconclusive findings on diagnostic imaging of breast: Secondary | ICD-10-CM | POA: Insufficient documentation

## 2017-04-25 ENCOUNTER — Emergency Department
Admission: EM | Admit: 2017-04-25 | Discharge: 2017-04-25 | Disposition: A | Discharge status: Home / Self Care | Payer: Medicare Other | Attending: Emergency Medicine | Admitting: Emergency Medicine

## 2017-04-25 ENCOUNTER — Encounter: Payer: Self-pay | Admitting: Emergency Medicine

## 2017-04-25 ENCOUNTER — Other Ambulatory Visit: Payer: Self-pay

## 2017-04-25 DIAGNOSIS — Z5321 Procedure and treatment not carried out due to patient leaving prior to being seen by health care provider: Secondary | ICD-10-CM | POA: Insufficient documentation

## 2017-04-25 DIAGNOSIS — I10 Essential (primary) hypertension: Secondary | ICD-10-CM | POA: Insufficient documentation

## 2017-04-25 LAB — BASIC METABOLIC PANEL
ANION GAP: 8 (ref 5–15)
BUN: 12 mg/dL (ref 6–20)
CO2: 29 mmol/L (ref 22–32)
Calcium: 8.9 mg/dL (ref 8.9–10.3)
Chloride: 102 mmol/L (ref 101–111)
Creatinine, Ser: 0.93 mg/dL (ref 0.44–1.00)
GFR calc Af Amer: >60 mL/min (ref >60)
GLUCOSE: 85 mg/dL (ref 65–99)
Potassium: 3.6 mmol/L (ref 3.5–5.1)
Sodium: 139 mmol/L (ref 135–145)

## 2017-04-25 LAB — CBC
HCT: 38.4 % (ref 35.0–47.0)
HEMOGLOBIN: 13.4 g/dL (ref 12.0–16.0)
MCH: 29.5 pg (ref 26.0–34.0)
MCHC: 34.8 g/dL (ref 32.0–36.0)
MCV: 84.6 fL (ref 80.0–100.0)
Platelets: 235 10*3/uL (ref 150–440)
RBC: 4.54 MIL/uL (ref 3.80–5.20)
RDW: 13.2 % (ref 11.5–14.5)
WBC: 6.4 10*3/uL (ref 3.6–11.0)

## 2017-04-25 NOTE — ED Triage Notes (Signed)
Pt ems from [place] drew for bp 210/110 with head ache. Headache started Saturday.

## 2017-04-25 NOTE — ED Notes (Signed)
Patient states that she would like to leave the facility without being seen.  This RN made MD review all blood work and EKG prior to patient leaving facility.

## 2017-04-25 NOTE — ED Triage Notes (Signed)
Pt to ed via ems from [place] drew for elevated blood pressure.  BP on arrival to ed down from that in the office.  Pt alert and oriented, reports headache, not worst of life, but reports it is severe. Also reports weakness and nausea. Denies vomiting.

## 2017-04-26 ENCOUNTER — Telehealth: Payer: Self-pay | Admitting: Emergency Medicine

## 2017-04-26 NOTE — Telephone Encounter (Signed)
Called patient due to lwot to inquire about condition and follow up plans. Left message.   

## 2017-05-08 NOTE — Progress Notes (Signed)
05/09/2017 10:37 AM   [name] 29-Jan-1961 [id number]  Referring provider: Donnie Coffin, MD [address] [place], [place] [address]  Chief Complaint  Patient presents with  . New Patient (Initial Visit)    HPI:    05/09/2017 10:37 AM   [name] [birth year] [id number]  Referring provider: Donnie Coffin, MD [facility], [place] [address]  Chief Complaint  Patient presents with  . New Patient (Initial Visit)    HPI: Patient is a 57 -year-old Caucasian female who is referred to [name] by [name], AGNP for recurrent urinary tract infections.  Patient states that she has had twourinary tract infections over the last year and 5 the year before.  Reviewing her records,  she has had no documented UTI's available to me at the time of the visit.    Her symptoms with a urinary tract infection consist of urgency, frequency and urge incontinence.   She endorses dysuria, suprapubic pain and back pain with UTI.   She has not had any recent fevers, chills, nausea or vomiting.   She does not have a history of nephrolithiasis, GU surgery or GU trauma.  She is not sexually active.  She is postmenopausal.   She admits to constipation.    She does engage in good perineal hygiene. She does not take tub baths.   She is having frequency, nocturia x2 and intermittency.  She has incontinence.  She is using incontinence pads.  She is going through 5 depends daily.   Her CATH UA today was negative.  Her PVR is 150 mL.  She has been on two days of Macrobid prescribed by her PCP for an UTI.    She is not having pain with bladder filling.  She has not had any recent imaging studies.    She is not drinking water.  She drinks diet Mountain Dew (4L daily) and sometimes coffee.  No juices.  Quit smoking 15 years ago.     Reviewed referral notes.    PMH: Past Medical History:  Diagnosis Date  . 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 Anxiety   .  Bipolar 1 disorder ([place] [facility])   . Cancer ([facility])    uterine  . Depression   . Dry mouth   . DVT of leg (deep venous thrombosis) ([facility])    history of  . Easy bruising   . Eye dryness   . Fatigue   . GERD (gastroesophageal reflux disease)   . Hyperlipidemia   . Hypertension   . Hypothyroid   . Migraine   . Migraine   . Seizure disorder ([facility])   . Seizures ([facility])    last year last seizure-had brain aneurysm. ON meds  . Stroke ([facility]) 1997   No deficits  . Suicidal ideation    attempted overdoses in past    Surgical History: Past Surgical History:  Procedure Laterality Date  . ABDOMINAL HYSTERECTOMY    . CEREBRAL ANEURYSM REPAIR  1997  . CESAREAN SECTION    . CHOLECYSTECTOMY    . COLONOSCOPY WITH PROPOFOL N/A 08/20/2012   Procedure: COLONOSCOPY WITH PROPOFOL;  Surgeon: [name], MD;  Location: [facility];  Service: Endoscopy;  Laterality: N/A;  In cecum at [facility]; Total Withdrawal Time= 12 minutes  . ESOPHAGOGASTRODUODENOSCOPY (EGD) WITH PROPOFOL N/A 11/20/2016   Procedure: ESOPHAGOGASTRODUODENOSCOPY (EGD) WITH PROPOFOL;  Surgeon: [name], MD;  Location[facility];  Service: Endoscopy;  Laterality: N/A;  . RADICAL HYSTERECTOMY      Home Medications:  Allergies as of 05/09/2017      Reactions   Bee Venom Anaphylaxis   Penicillins Shortness Of Breath, Rash   Latex    Lisinopril    Other    Sulfa Antibiotics       Medication List        Accurate as of 05/09/17 10:37 AM. Always use your most recent med list.          atorvastatin 40 MG tablet Commonly known as:  LIPITOR Take 40 mg by mouth at bedtime.   butalbital-acetaminophen-caffeine 50-325-40 MG tablet Commonly known as:  FIORICET, ESGIC Take 1-2 tablets by mouth every 4 (four) hours as needed for headache.   cyclobenzaprine 10 MG tablet Commonly known as:  FLEXERIL Take 5-10 mg by mouth every 8 (eight) hours as needed for muscle spasms.   divalproex 250 MG DR tablet Commonly known as:   DEPAKOTE Take 750 mg by mouth at bedtime.   EPINEPHrine 0.3 mg/0.3 mL Soaj injection Commonly known as:  EPI-PEN Inject 0.3 mg into the muscle as needed. For anaphylactic reaction   gabapentin 300 MG capsule Commonly known as:  NEURONTIN Take 300 mg by mouth 3 (three) times daily.   hydrocortisone 2.5 % cream Apply 1 application topically 2 (two) times daily.   labetalol 300 MG tablet Commonly known as:  NORMODYNE Take 300 mg by mouth 2 (two) times daily.   lamoTRIgine 200 MG tablet Commonly known as:  LAMICTAL Take 200 mg by mouth daily.   levothyroxine 75 MCG tablet Commonly known as:  SYNTHROID, LEVOTHROID Take 1 tablet (75 mcg total) by mouth daily before breakfast.   losartan 50 MG tablet Commonly known as:  COZAAR Take 1 tablet (50 mg total) by mouth daily.   losartan-hydrochlorothiazide 100-25 MG tablet Commonly known as:  HYZAAR Take 1 tablet by mouth daily.   nitrofurantoin 100 MG capsule Commonly known as:  MACRODANTIN TK 1 C PO BID FOR UTI   omeprazole 20 MG capsule Commonly known as:  PRILOSEC Take 20 mg by mouth daily.   ondansetron 8 MG tablet Commonly known as:  ZOFRAN Take by mouth every 8 (eight) hours as needed for nausea or vomiting.   pantoprazole 40 MG tablet Commonly known as:  PROTONIX Take 40 mg by mouth 2 (two) times daily.   phenytoin 100 MG ER capsule Commonly known as:  DILANTIN Take 200 mg by mouth 2 (two) times daily.   promethazine 12.5 MG tablet Commonly known as:  PHENERGAN Take 1 tablet (12.5 mg total) by mouth every 8 (eight) hours as needed for nausea or vomiting.   promethazine 12.5 MG tablet Commonly known as:  PHENERGAN Take 1 tablet (12.5 mg total) by mouth every 6 (six) hours as needed for nausea or vomiting.   sucralfate 1 g tablet Commonly known as:  CARAFATE Take 1 g by mouth 4 (four) times daily.   Verapamil HCl CR 300 MG Cp24 Take 1 capsule (300 mg total) by mouth daily.   VRAYLAR 4.5 MG Caps Generic  drug:  Cariprazine HCl TK ONE C PO QD       Allergies:  Allergies  Allergen Reactions  . Bee Venom Anaphylaxis  . Penicillins Shortness Of Breath and Rash  . Latex   . Lisinopril   . Other   . Sulfa Antibiotics     Family History: Family History  Problem Relation Age of Onset  . Colon  cancer Father        less than age 78  . Colon cancer Paternal Uncle        67s  . Colon cancer Paternal Grandfather   . Breast cancer Maternal Aunt   . Diabetes Sister   . Arthritis Brother   . Hematuria Mother   . Kidney cancer Maternal Grandmother   . Kidney disease Maternal Grandmother   . Prostate cancer Neg Hx     Social History:  reports that she quit smoking about 11 years ago. she has never used smokeless tobacco. She reports that she does not drink alcohol or use drugs.  ROS: UROLOGY Frequent Urination?: Yes Hard to postpone urination?: No Burning/pain with urination?: No Get up at night to urinate?: Yes Leakage of urine?: Yes Urine stream starts and stops?: Yes Trouble starting stream?: No Do you have to strain to urinate?: No Blood in urine?: No Urinary tract infection?: Yes Sexually transmitted disease?: No Injury to kidneys or bladder?: No Painful intercourse?: Yes Weak stream?: No Currently pregnant?: No Vaginal bleeding?: No Last menstrual period?: n  Gastrointestinal Nausea?: Yes Vomiting?: Yes Indigestion/heartburn?: Yes Diarrhea?: Yes Constipation?: No  Constitutional Fever: No Night sweats?: Yes Weight loss?: No Fatigue?: Yes  Skin Skin rash/lesions?: No Itching?: Yes  Eyes Blurred vision?: Yes Double vision?: No  Ears/Nose/Throat Sore throat?: No Sinus problems?: No  Hematologic/Lymphatic Swollen glands?: No Easy bruising?: No  Cardiovascular Leg swelling?: No Chest pain?: Yes  Respiratory Cough?: No Shortness of breath?: No  Endocrine Excessive thirst?: Yes  Musculoskeletal Back pain?: Yes Joint pain?:  No  Neurological Headaches?: Yes Dizziness?: No  Psychologic Depression?: Yes Anxiety?: Yes  Physical Exam: BP 118/77   Pulse 72   Ht _0  (1.6 m)   Wt 156 lb (70.8 kg)   BMI 27.63 kg/m   Constitutional: Well nourished. Alert and oriented, No acute distress. HEENT: [place] AT, moist mucus membranes. Trachea midline, no masses. Cardiovascular: No clubbing, cyanosis, or edema. Respiratory: Normal respiratory effort, no increased work of breathing. GI: Abdomen is soft, non tender, non distended, no abdominal masses. Liver and spleen not palpable.  No hernias appreciated.  Stool sample for occult testing is not indicated.   GU: No CVA tenderness.  No bladder fullness or masses.  Atrophic external genitalia, normal pubic hair distribution, no lesions.  Normal urethral meatus, no lesions, no prolapse, no discharge.   No urethral masses, tenderness and/or tenderness. No bladder fullness, tenderness or masses. Pale vagina mucosa, poor estrogen effect, no discharge, no lesions, good pelvic support, Grade II cystocele is noted.  No rectocele noted.  No cervical motion tenderness.  Uterus is freely mobile and non-fixed.  No adnexal/parametria masses or tenderness noted.  Anus and perineum are without rashes or lesions.    Skin: No rashes, bruises or suspicious lesions. Lymph: No cervical or inguinal adenopathy. Neurologic: Grossly intact, no focal deficits, moving all 4 extremities. Psychiatric: Normal mood and affect.  Laboratory Data: Lab Results  Component Value Date   WBC 6.4 04/25/2017   HGB 13.4 04/25/2017   HCT 38.4 04/25/2017   MCV 84.6 04/25/2017   PLT 235 04/25/2017    Lab Results  Component Value Date   CREATININE 0.93 04/25/2017    No results found for: PSA  No results found for: TESTOSTERONE  Lab Results  Component Value Date   HGBA1C  08/21/2007    5.0 (NOTE)   The ADA recommends the following therapeutic goals for glycemic   control related to Hgb  A1C measurement:    Goal of Therapy:   < 7.0% Hgb A1C   Action Suggested:  > 8.0% Hgb A1C   Ref:  Diabetes Care, 22, Suppl. 1, 1999    Lab Results  Component Value Date   TSH 2.015 07/21/2010       Component Value Date/Time   CHOL (H) 07/22/2010 0740    222        ATP III CLASSIFICATION:  <200     mg/dL   Desirable  200-239  mg/dL   Borderline High  >=240    mg/dL   High          HDL 37 (L) 07/22/2010 0740   CHOLHDL 6.0 07/22/2010 0740   VLDL 78 (H) 07/22/2010 0740   LDLCALC (H) 07/22/2010 0740    107        Total Cholesterol/HDL:CHD Risk Coronary Heart Disease Risk Table                     Men   Women  1/2 Average Risk   3.4   3.3  Average Risk       5.0   4.4  2 X Average Risk   9.6   7.1  3 X Average Risk  23.4   11.0        Use the calculated Patient Ratio above and the CHD Risk Table to determine the patient's CHD Risk.        ATP III CLASSIFICATION (LDL):  <100     mg/dL   Optimal  100-129  mg/dL   Near or Above                    Optimal  130-159  mg/dL   Borderline  160-189  mg/dL   High  >190     mg/dL   Very High    Lab Results  Component Value Date   AST 31 07/20/2015   Lab Results  Component Value Date   ALT 26 07/20/2015   No components found for: ALKALINEPHOPHATASE No components found for: BILIRUBINTOTAL  No results found for: ESTRADIOL  Urinalysis CATH UA Negative.  See Epic.   I have reviewed the labs.   Assessment & Plan:    1. Recurrent UTI's  - criteria for recurrent UTI has been met with 2 or more infections in 6 months or 3 or greater infections in one year - will need to contact Dr. Phill Mutter office for urine cultures   - Patient is instructed to increase their water intake until the urine is pale yellow or clear (10 to 12 cups daily)   - probiotics (yogurt, oral pills or vaginal suppositories), take cranberry pills or drink the juice and Vitamin C 1,000 mg daily to acidify the urine should be added to their daily regimen   - avoid soaking in  tubs and wipe front to back after urinating   - benefit from core strengthening exercises has been seen.  We can refer her to PT if they desire - referral placed   - advised them to have CATH UA's for urinalysis and culture to prevent skin contamination of the specimen  - reviewed symptoms of UTI and advised not to have urine checked or be treated for UTI if not experiencing symptoms  - discussed antibiotic stewardship with the patient    2. Vaginal atrophy  - I explained to the patient that when women go through menopause and her estrogen levels are  severely diminished, the normal vaginal flora will change.  This is due to an increase of the vaginal canal's pH. Because of this, the vaginal canal may be colonized by bacteria from the rectum instead of the protective lactobacillus.  This, accompanied by the loss of the mucus barrier with vaginal atrophy, is a cause of recurrent urinary tract infections.  - In some studies, the use of vaginal estrogen cream has been demonstrated to reduce  recurrent urinary tract infections to one a year.   - Patient was given a sample of vaginal estrogen cream (Premarin vaginal cream) and instructed to apply 0.70m (pea-sized amount)  just inside the vaginal introitus with a finger-tip on Monday, Wednesday and Friday nights.  I explained to the patient that vaginally administered estrogen, which causes only a slight increase in the blood estrogen levels, have fewer contraindications and adverse systemic effects that oral HT.  - I have also given prescriptions for the Estrace cream and Premarin cream, so that the patient may carry them to the pharmacy to see which one of the branded creams would be most economical for her.  If she finds both medications cost prohibitive, she is instructed to call the office.  We can then call in a compounded vaginal estrogen cream for the patient that may be more affordable.    - She will follow up in three months for an exam.    3. Mixed  incontinence  - offered behavioral therapies, bladder training, bladder control strategies and pelvic floor muscle training - refer to PT  - fluid management - TOLD PATIENT TO STOP DRINKING MOUNTAIN DEW- start drinking water  - offered medical therapy with anticholinergic therapy or beta-3 adrenergic receptor agonist and the potential side effects of each therapy - would like to try the beta-3 adrenergic receptor agonist (Myrbetriq).  Given Myrbetriq 25 mg samples, #28.  I have reviewed with the patient of the side effects of Myrbetriq, such as: elevation in BP, urinary retention and/or HA.    - She will return in 6 weeks for PVR and symptom recheck.                                      Return in about 6 weeks (around 06/20/2017) for PVR and OAB questionnaire.  These notes generated with voice recognition software. I apologize for typographical errors.  SZara Council [facility] [address] [place] [phone]

## 2017-05-09 ENCOUNTER — Ambulatory Visit (INDEPENDENT_AMBULATORY_CARE_PROVIDER_SITE_OTHER): Payer: Medicare Other | Admitting: Urology

## 2017-05-09 ENCOUNTER — Telehealth: Payer: Self-pay | Admitting: Urology

## 2017-05-09 ENCOUNTER — Encounter: Payer: Self-pay | Admitting: Urology

## 2017-05-09 VITALS — BP 118/77 | HR 72 | Ht 63.0 in | Wt 156.0 lb

## 2017-05-09 DIAGNOSIS — N3946 Mixed incontinence: Secondary | ICD-10-CM

## 2017-05-09 DIAGNOSIS — N952 Postmenopausal atrophic vaginitis: Secondary | ICD-10-CM

## 2017-05-09 DIAGNOSIS — N8111 Cystocele, midline: Secondary | ICD-10-CM | POA: Diagnosis not present

## 2017-05-09 LAB — MICROSCOPIC EXAMINATION
RBC MICROSCOPIC, UA: NONE SEEN /HPF (ref 0–?)
WBC, UA: NONE SEEN /hpf (ref 0–?)

## 2017-05-09 LAB — URINALYSIS, COMPLETE
BILIRUBIN UA: NEGATIVE
GLUCOSE, UA: NEGATIVE
Ketones, UA: NEGATIVE
Leukocytes, UA: NEGATIVE
Nitrite, UA: NEGATIVE
PH UA: 7 (ref 5.0–7.5)
PROTEIN UA: NEGATIVE
RBC UA: NEGATIVE
Specific Gravity, UA: 1.015 (ref 1.005–1.030)
Urobilinogen, Ur: 0.2 mg/dL (ref 0.2–1.0)

## 2017-05-09 MED ORDER — ESTRADIOL 0.1 MG/GM VA CREA: TOPICAL_CREAM | VAGINAL | 12 refills | Status: DC

## 2017-05-09 MED ORDER — ESTROGENS, CONJUGATED 0.625 MG/GM VA CREA: 1.0000 | TOPICAL_CREAM | Freq: Every day | VAGINAL | 12 refills | Status: DC

## 2017-05-09 NOTE — Progress Notes (Signed)
In and Out Catheterization  Patient is present today for a I & O catheterization due to recurrent UTI. Patient was cleaned and prepped in a sterile fashion with betadine and Lidocaine 2% jelly was instilled into the urethra.  A 14FR cath was inserted no complications were noted , 151ml of urine return was noted, urine was yellow in color. A clean urine sample was collected for u/a. Bladder was drained  And catheter was removed with out difficulty.    Preformed by: Toniann Fail, LPN

## 2017-05-09 NOTE — Telephone Encounter (Signed)
Will you request records of urine culture results from Dr. Phill Mutter office?

## 2017-05-09 NOTE — Telephone Encounter (Signed)
Notes have been faxed  Sharyn Lull

## 2017-05-09 NOTE — Patient Instructions (Signed)
                                             Urinary Tract Infection Prevention Patient Education Stay Hydrated: Urinary tract infections (UTIs) are less likely to occur in someone who is drinking enough water to promote regular urination, so it is very important to stay hydrated in order to help flush out bacteria from the urinary tract. Respond to "Nature's Call": It is always a good idea to urinate as soon as you feel the need. While "holding it in" does not directly cause an infection, it can cause overdistension that can damage the lining of the bladder, making it more vulnerable to bacteria. Remove Tampons Before Going: Remember to always take out tampons before urinating, and change tampons often.  Practice Proper Bathroom Hygiene: To keep bacteria near the urethral opening to a minimum, it is important to practice proper wiping techniques (i.e. front to back wiping) to help prevent rectal bacteria from entering the uretro-genital area. It can also be helpful to take showers and avoid soaking in the bathtub.  Take a Vitamin C Supplement: About 1,000 milligrams of vitamin C taken daily can help inhibit the growth of some bacteria by acidifying the urine. Maintain Control with Cranberries: Cranberries contain hippuronic acid, which is a natural antiseptic that may help prevent the adherence of bacteria to the bladder lining. Drinking 100% pure cranberry juice or taking over the counter cranberry supplements twice daily may help to prevent an infection. However, it is important to note that cranberry juices/supplements are not helpful once a urinary tract infection (UTI) is present. Strengthen Your Core: Often, a lazy bladder (unable to empty urine properly) occurs due to lower back problem, so consider doing exercises to help strengthen your back, pelvic floor, and stomach muscles. D- [place] Pay Attention to Your Urine: Your urine can change color for a variety of reasons, including from the  medications you take, so pay close attention to it to monitor your overall health. One key thing to note is that if your urine is typically a darker yellow, your body is dehydrated, so you need to step up your water intake.   I have given you two prescriptions for a vaginal estrogen cream.  Estrace and Premarin.  Please take these to your pharmacy and see which one your insurance covers.  If both are too expensive, please call the office at [phone] for an alternative.  You are given a sample of vaginal estrogen cream Premarin and instructed to apply 0.5mg  (pea-sized amount)  just inside the vaginal introitus with a finger-tip on Monday, Wednesday and Friday nights,

## 2017-05-23 ENCOUNTER — Telehealth: Payer: Self-pay

## 2017-05-23 NOTE — Telephone Encounter (Signed)
Pt called stating she feels like she has another UTI and requested abx. Reinforced with pt not able to give abx without a urine specimen. Inquired about pt fluid intake. Pt stated that she is down to 1L of Mt. Dew a day and is drinking water and juice. Pt then stated that she does not have transportation and therefore will just need the abx called in. Once again reinforced with pt will need a urine specimen. Pt became upset and stated she would try to find a ride.

## 2017-05-28 ENCOUNTER — Ambulatory Visit: Payer: Medicare Other

## 2017-06-04 ENCOUNTER — Ambulatory Visit: Payer: Medicare Other | Attending: Urology

## 2017-06-04 ENCOUNTER — Other Ambulatory Visit: Payer: Self-pay

## 2017-06-04 DIAGNOSIS — M791 Myalgia, unspecified site: Secondary | ICD-10-CM

## 2017-06-04 DIAGNOSIS — M62838 Other muscle spasm: Secondary | ICD-10-CM | POA: Insufficient documentation

## 2017-06-04 DIAGNOSIS — R278 Other lack of coordination: Secondary | ICD-10-CM

## 2017-06-04 DIAGNOSIS — N3946 Mixed incontinence: Secondary | ICD-10-CM
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
## 2017-06-04 NOTE — Patient Instructions (Signed)
Stabilization: Diaphragmatic Breathing    Lie with knees bent, feet flat. Place one hand on stomach, other on chest. Breathe deeply through nose, lifting belly hand without any motion of hand on chest. Repeat __20__ times per set. Do _2___ sets per session. Do _1-5 times per day. Breathe through your mouth to start.

## 2017-06-11 ENCOUNTER — Ambulatory Visit: Payer: Medicare Other

## 2017-06-12 DIAGNOSIS — R51 Headache: Secondary | ICD-10-CM

## 2017-06-12 DIAGNOSIS — R413 Other amnesia: Secondary | ICD-10-CM | POA: Insufficient documentation

## 2017-06-12 DIAGNOSIS — R519 Headache, unspecified: Secondary | ICD-10-CM | POA: Insufficient documentation

## 2017-06-18 ENCOUNTER — Ambulatory Visit: Payer: Medicare Other

## 2017-06-18 DIAGNOSIS — M62838 Other muscle spasm: Secondary | ICD-10-CM

## 2017-06-18 DIAGNOSIS — M791 Myalgia, unspecified site: Secondary | ICD-10-CM

## 2017-06-18 DIAGNOSIS — R278 Other lack of coordination: Secondary | ICD-10-CM

## 2017-06-18 DIAGNOSIS — N3946 Mixed incontinence: Secondary | ICD-10-CM

## 2017-06-18 NOTE — Therapy (Signed)
[place] MAIN [facility] SERVICES [address] [place], [place], [address] Phone: [phone]   Fax:  [phone]  Physical Therapy Treatment  Patient Details  Name: Robin Jensen MRN: [id number] Date of Birth: [birth year] Referring Provider: Ernestine Conrad   Encounter Date: 06/18/2017  PT End of Session - 06/18/17 1004    Visit Number  2    Number of Visits  10    Date for PT Re-Evaluation  06/25/17    Authorization Type  MedicareMedicaid    Authorization - Visit Number  2    Authorization - Number of Visits  10    PT Start Time  0831    PT Stop Time  0931    PT Time Calculation (min)  60 min    Activity Tolerance  Patient tolerated treatment well    Behavior During Therapy  [facility] for tasks assessed/performed       Past Medical History:  Diagnosis Date  . 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 Anxiety   . Bipolar 1 disorder ([place])   . Cancer ([place])    uterine  . Depression   . Dry mouth   . DVT of leg (deep venous thrombosis) (HCC)    history of  . Easy bruising   . Eye dryness   . Fatigue   . GERD (gastroesophageal reflux disease)   . Hyperlipidemia   . Hypertension   . Hypothyroid   . Migraine   . Migraine   . Seizure disorder ([place])   . Seizures ([place])    last year last seizure-had brain aneurysm. ON meds  . Stroke ([place]) 1997   No deficits  . Suicidal ideation    attempted overdoses in past    Past Surgical History:  Procedure Laterality Date  . ABDOMINAL HYSTERECTOMY    . CEREBRAL ANEURYSM REPAIR  1997  . CESAREAN SECTION    . CHOLECYSTECTOMY    . COLONOSCOPY WITH PROPOFOL N/A 08/20/2012   Procedure: COLONOSCOPY WITH PROPOFOL;  Surgeon: [name], MD;  Location: [facility];  Service: Endoscopy;  Laterality: N/A;  In cecum at [place]; Total Withdrawal Time= 12 minutes  . ESOPHAGOGASTRODUODENOSCOPY (EGD) WITH PROPOFOL N/A 11/20/2016   Procedure: ESOPHAGOGASTRODUODENOSCOPY (EGD) WITH PROPOFOL;  Surgeon: [name], MD;   [facility] ENDOSCOPY;  Service: Endoscopy;  Laterality: N/A;  . RADICAL HYSTERECTOMY      There were no vitals filed for this visit.    Pelvic Floor Physical Therapy Treatment Note  SCREENING  Changes in medications, allergies, or medical history?: was put back on aricept and 2 other medications, 1 from her neurologist and1 for her back pain but cannot remember what they all were.      SUBJECTIVE  Patient reports: Her back pain has kept her from being able to do household chores, having to stop and take breaks. Had a manic episode and was up for 4 days, Finally got some sleep on Saturday (all day) and half a day Sunday. No change in urinary symptoms, cannot remember the day she had a BM but it has only been once last week.     Pain update:  Location of pain: mid back, neck, and pelvis Current pain:  8/10  Max pain:  10+/10 Least pain: 8 /10 Nature of pain: sharp or dull, pulsing  Patient Goals: Decrease incontinence, be able to wear incontinence pads instead of adult diaper.   OBJECTIVE  Changes in: Posture/Observations:  Stork: positive on L  Leg-length: 80 cm on L, 78 on R Anterior pelvic tilt and hyper kyphotic/lordotic curve in seated and standing. Feet are supinated at all times, toes are "gripping" B, R>L.  Range of Motion/Flexibilty:  <5% of motion in R side-bend before "catches" and patient starts to fall. Up to ~ 30% motion in L side-bending. Similr pattern in Rotation with R far more restricted than L but both decreased.  L ER 50 deg, IR 15 deg, ABD 40 deg (high tone) R ER 45 deg, IR 30 deg, ABD 45 deg Hip flex to ~95 before pain in LB. HS 90/90 test to ~ -30 deg B L and R hip ext to neutral    Strength/MMT:  LE MMT:  LE MMT Left Right  Hip flex:  (L2) /5 /5  Hip ext: 4/5 5/5  Hip abd: 4+/5 4/5  Hip add: 4/5 4+/5  Hip IR 3+/5 4/5  Hip ER +4/5 3+/5    Abdominal:  TTP B through Psoas R>L, and in RLQ. No diastasis.  Palpation: TTP through  PSIS on L>R  Gait Analysis: Patient spends increased stance time on the L leg and Vaults through L stance phase. Demonstrates minimal thoracic motion throughout gait cycyle and takes short strides with minimal hip extension demonstrates a painful guarding posture.  INTERVENTIONS THIS SESSION: Manual: Assessed hip ROM, strength and pelvic alignment for further POC development. Therex: Assessed gait and standing posture/feet. Patient educated on 3-way wall stretch to decrease tension acting on and within the pelvis and a calf stretch against the wall to improve ankle ROM and allow for less toe-curling which can create neurologic overflow into pelvic floor tightness.  Self-care: educated patient on where to purchase a shoe-lift that is latex-free and which shoe to put it in as well as how her pelvic alignment is affecting her increased PFM spasms and pain. Instructed Pt. To keep a written log of when she has a BM and whether she had a lot or a little stool pass to help [name] track her progress.   Total time: 60 min.                         PT Education - 06/18/17 1004    Education provided  Yes    Education Details  See Pt. instructions and Interventions this session    Person(s) Educated  Patient    Methods  Explanation;Demonstration;Tactile cues;Verbal cues;Handout    Comprehension  Verbalized understanding;Returned demonstration;Verbal cues required;Tactile cues required;Need further instruction       PT Short Term Goals - 06/05/17 0759      PT SHORT TERM GOAL #1   Title  Patient will demonstrate HEP x1 in the clinic to demonstrate understanding and proper form to allow for further improvement.    Time  5    Period  Weeks    Status  New    Target Date  07/09/17      PT SHORT TERM GOAL #2   Title  Patient will report consistent use of foot-stool (squatty-potty) for positioning with BM to decrease pain with BM and intra-abdominal pressure.    Time  5    Period   Weeks    Status  New    Target Date  07/09/17      PT SHORT TERM GOAL #3   Title  Patient will demonstrate appropriate body mechanics with bending and lifting heavy objects to allow for decreased stress on the pelvic floor and low  back.     Time  5    Period  Weeks    Status  New    Target Date  07/09/17      PT SHORT TERM GOAL #4   Title  Patient will report drinking at least 5 glassess of water per day to facilitate decreased constipation and bladder irritation leading to incontinence episodes    Time  5    Period  Weeks    Status  New    Target Date  07/10/17      PT SHORT TERM GOAL #5   Title  Patient will report successful implementation of urge suppression technique at least 3 times over prior week to demonstrate learning and facilitate decreased Urge UI.    Time  5    Period  Weeks    Status  New    Target Date  07/09/17        PT Long Term Goals - 06/05/17 0802      PT LONG TERM GOAL #1   Title  Patient will demonstrate a coordinated contraction, relaxation, and bulge of the pelvic floor muscles to demonstrate functional recruitment and motion and allow for further strengthening.    Time  10    Period  Weeks    Status  New    Target Date  08/13/17      PT LONG TERM GOAL #2   Title  Patient will report ability to have a BM without straining at least 3 days ovet the prior week to demonstrate decreased constipation.    Time  10    Period  Weeks    Status  New    Target Date  08/13/17      PT LONG TERM GOAL #3   Title  Patient will demonstrate ability to perform self internal TP release in order to facilitate further PFM spasm reduction at home for faster resolution of symptoms    Time  10    Period  Weeks    Status  New    Target Date  08/13/17      PT LONG TERM GOAL #4   Title  Patient will score at or below 71 on the PFDI and 50 on the PFIQ to demonstrate a clinically meaningful decrease in disability and distress due to pelvic floor dysfunction.     Baseline  PFDI: 161/300, PFIQ:95/300    Time  10    Period  Weeks    Status  New    Target Date  08/13/17            Plan - 06/18/17 1005    Clinical Impression Statement  Patient responded well to today's treatment and is already demonstrating improved ability to perform diaphragmatic breathing in supine. Further assessment today demonstrated muscular imbalance of the hips, poor pelvic stability, and decreased motion of the spine, all of which are contributing to her pelvic pain. Patient's pelvic alignment was improved with heel-lift on R side but patient has latex allergy so she could not be supplied with one that we offer in-house. Continue per POC.    Clinical Presentation  Evolving    Clinical Decision Making  High    Rehab Potential  Fair    Clinical Impairments Affecting Rehab Potential  Psychiatric conditions, fear, anxiety, poor body awareness.    PT Frequency  1x / week    PT Duration  12 weeks    PT Treatment/Interventions  ADLs/Self Care Home Management;Aquatic Therapy;Biofeedback;Traction;Moist Heat;Electrical Stimulation;Functional mobility training;Neuromuscular  re-education;Balance training;Therapeutic exercise;Therapeutic activities;Patient/family education;Manual techniques;Dry needling;Passive range of motion;Scar mobilization;Taping    PT Next Visit Plan  Educate on squatty potty, stretches, breathing in quad and from supine to sit. spinal and sacral mobility, soft tissue to B psoas and QL, R lateral hip.    PT Home Exercise Plan  diaphragmatic breathing, 3-way wall stretch, calf stretch    Recommended Other Services  Social work for help with attaining restraining order without fear, [facility] -walk in crisis [phone]    Consulted and Agree with Plan of Care  Patient       Patient will benefit from skilled therapeutic intervention in order to improve the following deficits and impairments:  Abnormal gait, Increased fascial restricitons, Improper body mechanics, Pain,  Decreased coordination, Decreased mobility, Decreased scar mobility, Increased muscle spasms, Impaired tone, Postural dysfunction, Decreased endurance, Decreased range of motion, Decreased strength, Decreased balance  Visit Diagnosis: Other muscle spasm  Myalgia  Mixed stress and urge urinary incontinence  Other lack of coordination     Problem List Patient Active Problem List   Diagnosis Date Noted  . Aneurysm, cerebral, nonruptured 08/14/2012  . Unspecified hypothyroidism 08/14/2012  . Other and unspecified hyperlipidemia 08/14/2012  . Seizures ([place]) 08/14/2012  . Insomnia 08/14/2012  . Unspecified constipation 08/07/2012  . FHx: colon cancer 08/07/2012  . Right sided abdominal pain 08/07/2012  . Essential hypertension, benign 08/07/2012  . Abnormal LFTs 08/07/2012   Willa Rough DPT, ATC Willa Rough 06/18/2017, 10:14 AM  [place] MAIN [facility] SERVICES [address] [place], [place], [address] Phone: [phone]   Fax:  [phone]  Name: Robin Jensen MRN: [id number] Date of Birth: 01-29-1961

## 2017-06-18 NOTE — Patient Instructions (Addendum)
  Go to [email or web address] and look for: Clearly Adjustable Heel Lifts ($ 13.99)  This will go in your Right shoe.  3-Way Wall Stretches for Pelvic Floor Lengthening   Bring bottom close to the wall and gently press straighten knees to feel a stretch down the back of you thighs. Hold while taking 5 deep belly breaths and feeling the pelvic floor relax and lower on each inhale.    Let your legs fall to the side to feel a stretch on the inside of your thighs. If the stretch is too intense you can use a pillow to take some of the weight off by wedging it on the outside of your hips. Hold while taking 5 deep belly breaths and feeling the pelvic floor relax and lower on each inhale.    Slide feet down the wall and move hips slightly away from the wall and then let your knees fall to the sides so you feel a stretch on the inside of the thighs near your groin. Hold while taking 5 deep belly breaths and feeling the pelvic floor relax and lower on each inhale.     *Perform each stretch in sequence 3 times, once a day     Bring your hips in toward the wall and hold for 5 deep breaths, repeat 3 times for each leg. Do this one time per day   Stabilization: Diaphragmatic Breathing    Lie with knees bent, feet flat. Place one hand on stomach, other on chest. Breathe deeply through nose, lifting belly hand without any motion of hand on chest. Repeat __20__ times per set. Do _2___ sets per session. Do _1-5 times per day. Breathe through your mouth to start.

## 2017-06-19 ENCOUNTER — Encounter: Payer: Self-pay | Admitting: Gastroenterology

## 2017-06-20 ENCOUNTER — Encounter: Payer: Self-pay | Admitting: Urology

## 2017-06-20 ENCOUNTER — Ambulatory Visit (INDEPENDENT_AMBULATORY_CARE_PROVIDER_SITE_OTHER): Payer: Medicare Other | Admitting: Urology

## 2017-06-20 VITALS — BP 148/101 | HR 70 | Resp 16 | Ht 63.0 in | Wt 156.7 lb

## 2017-06-20 DIAGNOSIS — N952 Postmenopausal atrophic vaginitis: Secondary | ICD-10-CM

## 2017-06-20 DIAGNOSIS — N3946 Mixed incontinence: Secondary | ICD-10-CM | POA: Diagnosis not present

## 2017-06-20 DIAGNOSIS — N390 Urinary tract infection, site not specified: Secondary | ICD-10-CM

## 2017-06-20 LAB — BLADDER SCAN AMB NON-IMAGING: SCAN RESULT: 52

## 2017-06-20 MED ORDER — NITROFURANTOIN MONOHYD MACRO 100 MG PO CAPS: 100.0000 mg | ORAL_CAPSULE | Freq: Two times a day (BID) | ORAL | 0 refills | Status: DC

## 2017-06-20 MED ORDER — MIRABEGRON ER 25 MG PO TB24: 25.0000 mg | ORAL_TABLET | Freq: Every day | ORAL | 12 refills | Status: DC

## 2017-06-20 NOTE — Progress Notes (Signed)
06/20/2017 1:45 PM   Robin Jensen [birth year] [id number]  Referring provider: Donnie Coffin, MD [place], [place] [address]  Chief Complaint  Patient presents with  . Follow-up    bladder problem     HPI:    06/20/2017 1:45 PM   Robin Jensen 1961-04-16 [id number]  Referring provider: Donnie Coffin, MD [name] [employer], [place] [address]  Chief Complaint  Patient presents with  . Follow-up    bladder problem     HPI: 57 year old Caucasian female with recurrent UTIs, vaginal atrophy and mixed urinary incontinence who presents today for a 6-week follow-up after trial of Myrbetriq for her incontinence.     Background history Patient is a 89 -year-old Caucasian female who is referred to [name] by [name], AGNP for recurrent urinary tract infections.   Patient states that she has had two urinary tract infections over the last year and 5 the year before.  Reviewing her records,  she has had no documented UTI's available to me at the time of the visit.    Her symptoms with a urinary tract infection consist of urgency, frequency and urge incontinence.   She endorses dysuria, suprapubic pain and back pain with UTI.   She has not had any recent fevers, chills, nausea or vomiting.  She does not have a history of nephrolithiasis, GU surgery or GU trauma.  She is not sexually active.  She is postmenopausal.  She admits to constipation.    She does engage in good perineal hygiene. She does not take tub baths.  She is having frequency, nocturia x2 and intermittency.  She has incontinence.  She is using incontinence pads.  She is going through 5 depends daily.   Her CATH UA today was negative.  Her PVR is 150 mL.  She has been on two days of Macrobid .was prescribed by her PCP for an UTI.  She is not having pain with bladder filling.  She has not had any recent imaging studies.  She is not drinking water.  She drinks diet Mountain Dew (4L daily)  and sometimes coffee.  No juices.  Quit smoking 15 years ago.    At her visit on May 09, 2017, we discussed UTI prevention strategies, she was initiated on vaginal estrogen cream for her vaginal atrophy, she was referred to physical therapy and started on Myrbetriq 25 mg daily.  Today, she states that she feels that she may be developing a urinary tract infection.  She stated that her symptoms started 3 days ago.  She is having suprapubic discomfort, urgency and dysuria.  Her UA at today's visit is positive for nitrates, greater than 30 WBCs, 3-10 RBCs and many bacteria.  Patient denies any gross hematuria, dysuria or suprapubic/flank pain.  Patient denies any fevers, chills, nausea or vomiting.   The patient has been experiencing urgency x 4-7, frequency x 8 or more, is restricting fluids to avoid visits to the restroom, is engaging in toilet mapping, incontinence x 4-7 and nocturia x 4-7.   Her PVR is 52 mL.  Her BP is 148/101.  She states that she found the Myrbetriq samples helpful as her symptoms returned after she ran out of her samples 2 weeks ago.  She has added more water to her intake and has decreased her [facility] intake drastically.  She is now experiencing headaches.    She is applying the vaginal estrogen cream three nights weekly.  She has started PT therapy.    PMH: Past Medical History:  Diagnosis Date  . 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 Anxiety   . Bipolar 1 disorder ([place])   . Cancer ([facility])    uterine  . Depression   . Dry mouth   . DVT of leg (deep venous thrombosis) ([facility])    history of  . Easy bruising   . Eye dryness   . Fatigue   . GERD (gastroesophageal reflux disease)   . Hyperlipidemia   . Hypertension   . Hypothyroid   . Migraine   . Migraine   . Seizure disorder ([place])   . Seizures ([place])    last year last seizure-had brain aneurysm. ON meds  . Stroke ([facility] [place] [facility]) 1997   No deficits  . Suicidal ideation    attempted overdoses in past     Surgical History: Past Surgical History:  Procedure Laterality Date  . ABDOMINAL HYSTERECTOMY    . CEREBRAL ANEURYSM REPAIR  1997  . CESAREAN SECTION    . CHOLECYSTECTOMY    . COLONOSCOPY WITH PROPOFOL N/A 08/20/2012   Procedure: COLONOSCOPY WITH PROPOFOL;  Surgeon: [name], MD;  Location: [facility];  Service: Endoscopy;  Laterality: N/A;  In cecum at [place]; Total Withdrawal Time= 12 minutes  . ESOPHAGOGASTRODUODENOSCOPY (EGD) WITH PROPOFOL N/A 11/20/2016   Procedure: ESOPHAGOGASTRODUODENOSCOPY (EGD) WITH PROPOFOL;  Surgeon: [name], MD;  Location[facility];  Service: Endoscopy;  Laterality: N/A;  . RADICAL HYSTERECTOMY      Home Medications:  Allergies as of 06/20/2017      Reactions   Bee Venom Anaphylaxis   Penicillins Shortness Of Breath, Rash   Latex    Lisinopril    Other    Sulfa Antibiotics       Medication List        Accurate as of 06/20/17  1:45 PM. Always use your most recent med list.          atorvastatin 40 MG tablet Commonly known as:  LIPITOR Take 40 mg by mouth at bedtime.   butalbital-acetaminophen-caffeine 50-325-40 MG tablet Commonly known as:  FIORICET, ESGIC Take 1-2 tablets by mouth every 4 (four) hours as needed for headache.   conjugated estrogens vaginal cream Commonly known as:  PREMARIN Place 1 Applicatorful vaginally daily. Apply 0.5mg  (pea-sized amount)  just inside the vaginal introitus with a finger-tip on  Monday, Wednesday and Friday nights.   cyclobenzaprine 10 MG tablet Commonly known as:  FLEXERIL Take 5-10 mg by mouth every 8 (eight) hours as needed for muscle spasms.   divalproex 250 MG DR tablet Commonly known as:  DEPAKOTE Take 750 mg by mouth at bedtime.   donepezil 10 MG tablet Commonly known as:  ARICEPT Take 10 mg by mouth at bedtime.   EPINEPHrine 0.3 mg/0.3 mL Soaj injection Commonly known as:  EPI-PEN Inject 0.3 mg into the muscle as needed. For anaphylactic reaction   estradiol 0.1  MG/GM vaginal cream Commonly known as:  ESTRACE VAGINAL Apply 0.5mg  (pea-sized amount)  just inside the vaginal introitus with a finger-tip on Monday, Wednesday and Friday nights.   gabapentin 300 MG capsule Commonly known as:  NEURONTIN Take 300 mg by mouth 3 (three) times daily.   hydrocortisone 2.5 % cream Apply 1 application topically 2 (two) times daily.   labetalol 300 MG tablet Commonly known as:  NORMODYNE Take 300 mg by mouth 2 (two) times daily.   labetalol 200 MG tablet  Commonly known as:  NORMODYNE TK 3 TS PO BID   lamoTRIgine 200 MG tablet Commonly known as:  LAMICTAL Take 250 mg by mouth daily.   levothyroxine 75 MCG tablet Commonly known as:  SYNTHROID, LEVOTHROID Take 1 tablet (75 mcg total) by mouth daily before breakfast.   losartan 50 MG tablet Commonly known as:  COZAAR Take 1 tablet (50 mg total) by mouth daily.   losartan-hydrochlorothiazide 100-25 MG tablet Commonly known as:  HYZAAR Take 1 tablet by mouth daily.   mirabegron ER 25 MG Tb24 tablet Commonly known as:  MYRBETRIQ Take 1 tablet (25 mg total) by mouth daily.   naproxen 375 MG tablet Commonly known as:  NAPROSYN naproxen 375 mg tablet  take 1 PO BID   nitrofurantoin (macrocrystal-monohydrate) 100 MG capsule Commonly known as:  MACROBID Take 1 capsule (100 mg total) by mouth every 12 (twelve) hours.   nitrofurantoin 100 MG capsule Commonly known as:  MACRODANTIN TK 1 C PO BID FOR UTI   nortriptyline 10 MG capsule Commonly known as:  PAMELOR   omeprazole 20 MG capsule Commonly known as:  PRILOSEC Take 20 mg by mouth daily.   ondansetron 8 MG tablet Commonly known as:  ZOFRAN Take by mouth every 8 (eight) hours as needed for nausea or vomiting.   pantoprazole 40 MG tablet Commonly known as:  PROTONIX Take 40 mg by mouth 2 (two) times daily.   phenytoin 100 MG ER capsule Commonly known as:  DILANTIN Take 200 mg by mouth 2 (two) times daily.   promethazine 12.5 MG  tablet Commonly known as:  PHENERGAN Take 1 tablet (12.5 mg total) by mouth every 8 (eight) hours as needed for nausea or vomiting.   promethazine 12.5 MG tablet Commonly known as:  PHENERGAN Take 1 tablet (12.5 mg total) by mouth every 6 (six) hours as needed for nausea or vomiting.   sucralfate 1 g tablet Commonly known as:  CARAFATE Take 1 g by mouth 4 (four) times daily.   Verapamil HCl CR 300 MG Cp24 Take 1 capsule (300 mg total) by mouth daily.   VRAYLAR 4.5 MG Caps Generic drug:  Cariprazine HCl TK ONE C PO QD       Allergies:  Allergies  Allergen Reactions  . Bee Venom Anaphylaxis  . Penicillins Shortness Of Breath and Rash  . Latex   . Lisinopril   . Other   . Sulfa Antibiotics     Family History: Family History  Problem Relation Age of Onset  . Colon cancer Father        less than age 73  . Colon cancer Paternal Uncle        42s  . Colon cancer Paternal Grandfather   . Breast cancer Maternal Aunt   . Diabetes Sister   . Arthritis Brother   . Hematuria Mother   . Kidney cancer Maternal Grandmother   . Kidney disease Maternal Grandmother   . Prostate cancer Neg Hx     Social History:  reports that she quit smoking about 11 years ago. she has never used smokeless tobacco. She reports that she does not drink alcohol or use drugs.  ROS: UROLOGY Frequent Urination?: Yes Hard to postpone urination?: No Burning/pain with urination?: No Get up at night to urinate?: No Leakage of urine?: Yes Urine stream starts and stops?: No Trouble starting stream?: No Do you have to strain to urinate?: No Blood in urine?: No Urinary tract infection?: Yes Sexually transmitted disease?: No Injury  to kidneys or bladder?: No Painful intercourse?: Yes Weak stream?: No Currently pregnant?: No Vaginal bleeding?: No Last menstrual period?: n  Gastrointestinal Nausea?: Yes Vomiting?: No Indigestion/heartburn?: No Diarrhea?: No Constipation?:  Yes  Constitutional Fever: No Night sweats?: Yes Weight loss?: No Fatigue?: Yes  Skin Skin rash/lesions?: No Itching?: No  Eyes Blurred vision?: Yes Double vision?: No  Ears/Nose/Throat Sore throat?: No Sinus problems?: No  Hematologic/Lymphatic Swollen glands?: No Easy bruising?: No  Cardiovascular Leg swelling?: No Chest pain?: No  Respiratory Cough?: No Shortness of breath?: No  Endocrine Excessive thirst?: Yes  Musculoskeletal Back pain?: Yes Joint pain?: No  Neurological Headaches?: Yes Dizziness?: No  Psychologic Depression?: Yes Anxiety?: Yes  Physical Exam: BP (!) 148/101   Pulse 70   Resp 16   Ht 5\' 3"  (1.6 m)   Wt 156 lb 11.2 oz (71.1 kg)   SpO2 99%   BMI 27.76 kg/m   Constitutional: Well nourished. Alert and oriented, No acute distress. HEENT: [place] AT, moist mucus membranes. Trachea midline, no masses. Cardiovascular: No clubbing, cyanosis, or edema. Respiratory: Normal respiratory effort, no increased work of breathing. Skin: No rashes, bruises or suspicious lesions. Lymph: No cervical or inguinal adenopathy. Neurologic: Grossly intact, no focal deficits, moving all 4 extremities. Psychiatric: Normal mood and affect.  Laboratory Data: Lab Results  Component Value Date   WBC 6.4 04/25/2017   HGB 13.4 04/25/2017   HCT 38.4 04/25/2017   MCV 84.6 04/25/2017   PLT 235 04/25/2017    Lab Results  Component Value Date   CREATININE 0.93 04/25/2017    No results found for: PSA  No results found for: TESTOSTERONE  Lab Results  Component Value Date   HGBA1C  08/21/2007    5.0 (NOTE)   The ADA recommends the following therapeutic goals for glycemic   control related to Hgb A1C measurement:   Goal of Therapy:   < 7.0% Hgb A1C   Action Suggested:  > 8.0% Hgb A1C   Ref:  Diabetes Care, 22, Suppl. 1, 1999    Lab Results  Component Value Date   TSH 2.015 07/21/2010       Component Value Date/Time   CHOL (H) 07/22/2010 0740     222        ATP III CLASSIFICATION:  <200     mg/dL   Desirable  200-239  mg/dL   Borderline High  >=240    mg/dL   High          HDL 37 (L) 07/22/2010 0740   CHOLHDL 6.0 07/22/2010 0740   VLDL 78 (H) 07/22/2010 0740   LDLCALC (H) 07/22/2010 0740    107        Total Cholesterol/HDL:CHD Risk Coronary Heart Disease Risk Table                     Men   Women  1/2 Average Risk   3.4   3.3  Average Risk       5.0   4.4  2 X Average Risk   9.6   7.1  3 X Average Risk  23.4   11.0        Use the calculated Patient Ratio above and the CHD Risk Table to determine the patient's CHD Risk.        ATP III CLASSIFICATION (LDL):  <100     mg/dL   Optimal  100-129  mg/dL   Near or Above  Optimal  130-159  mg/dL   Borderline  160-189  mg/dL   High  >190     mg/dL   Very High    Lab Results  Component Value Date   AST 31 07/20/2015   Lab Results  Component Value Date   ALT 26 07/20/2015   No components found for: ALKALINEPHOPHATASE No components found for: BILIRUBINTOTAL  No results found for: ESTRADIOL  Urinalysis Nitrite positive.  > 30 WBC's.  3-10 RBC's.  Many bacteria.  See Epic.   I have reviewed the labs.   Assessment & Plan:    1. Recurrent UTI's  -UA is suspicious for UTI - sent for culture  -start nitrofurantoin empirically - will adjust when ucx & sensitivities are available if necessary  -Reviewed UTI prevention strategies  2. Vaginal atrophy  -continue applying the vaginal estrogen cream three nights weekly  - She will follow up in three months for an exam.    3. Mixed incontinence  - found the Myrbetriq 25 mg daily helpful - script sent to pharmacy  - RTC in 3 months for OAB questionnaire, PVR and exam                                      Return in about 3 months (around 09/17/2017) for OAB questionnaire, PVR and exam.  These notes generated with voice recognition software. I apologize for typographical errors.  Zara Council,  [facility] [address] [place], [place] [address] [phone]

## 2017-06-20 NOTE — Patient Instructions (Signed)

## 2017-06-21 LAB — URINALYSIS, COMPLETE
Bilirubin, UA: NEGATIVE
GLUCOSE, UA: NEGATIVE
KETONES UA: NEGATIVE
Nitrite, UA: POSITIVE — AB
SPEC GRAV UA: 1.015 (ref 1.005–1.030)
Urobilinogen, Ur: 0.2 mg/dL (ref 0.2–1.0)
pH, UA: 7 (ref 5.0–7.5)

## 2017-06-21 LAB — MICROSCOPIC EXAMINATION: Epithelial Cells (non renal): >10 /hpf — ABNORMAL HIGH (ref 0–10)

## 2017-06-22 ENCOUNTER — Telehealth: Payer: Self-pay

## 2017-06-22 ENCOUNTER — Other Ambulatory Visit: Payer: Self-pay | Admitting: Urology

## 2017-06-22 LAB — CULTURE, URINE COMPREHENSIVE

## 2017-06-22 MED ORDER — CIPROFLOXACIN HCL 500 MG PO TABS: 500.0000 mg | ORAL_TABLET | Freq: Two times a day (BID) | ORAL | 0 refills | Status: DC

## 2017-06-22 NOTE — Telephone Encounter (Signed)
We can have the patient try oxybuytnin XL 15 mg instead.

## 2017-06-22 NOTE — Telephone Encounter (Signed)
Pharmacist from [facility] called stating pt was recently started on nortriptyline. Pharmacist states that nortriptyline interacts with myrbetriq and causes QT enlongation. Pharmacist would not allow pt to have medication. Please advise.

## 2017-06-22 NOTE — Progress Notes (Signed)
See note under urine culture from 06/20/2017.

## 2017-06-25 ENCOUNTER — Ambulatory Visit: Payer: Medicare Other | Attending: Urology

## 2017-06-25 NOTE — Telephone Encounter (Signed)
Please send to [facility], this is her only pharmacy now.

## 2017-06-25 NOTE — Telephone Encounter (Signed)
No answer and not able to leave a message. 

## 2017-06-25 NOTE — Telephone Encounter (Signed)
-----   Message from Nori Riis, PA-C sent at 06/22/2017  4:52 PM EST ----- Please let Robin Jensen know that she has a positive urine culture.  She needs to stop the nitrofurantoin and start Cipro 500 mg bid for seven days.  I have sent the script to [facility] in [place].

## 2017-07-02 ENCOUNTER — Ambulatory Visit: Payer: Medicare Other

## 2017-07-02 MED ORDER — NITROFURANTOIN MONOHYD MACRO 100 MG PO CAPS: 100.0000 mg | ORAL_CAPSULE | Freq: Two times a day (BID) | ORAL | 0 refills | Status: DC

## 2017-07-02 NOTE — Telephone Encounter (Signed)
Patient notified and script sent to [facility], patient states pharm never recieved

## 2017-07-09 ENCOUNTER — Ambulatory Visit: Payer: Medicare Other

## 2017-07-16 ENCOUNTER — Ambulatory Visit: Payer: Medicare Other

## 2017-07-23 ENCOUNTER — Ambulatory Visit: Payer: Medicare Other | Attending: Urology

## 2017-07-26 ENCOUNTER — Other Ambulatory Visit: Payer: Self-pay | Admitting: Gastroenterology

## 2017-07-26 DIAGNOSIS — R1013 Epigastric pain: Secondary | ICD-10-CM

## 2017-07-30 ENCOUNTER — Ambulatory Visit: Payer: Medicare Other

## 2017-08-08 ENCOUNTER — Other Ambulatory Visit: Payer: Self-pay

## 2017-08-08 ENCOUNTER — Emergency Department
Admission: EM | Admit: 2017-08-08 | Discharge: 2017-08-08 | Disposition: A | Discharge status: Home / Self Care | Payer: Medicare Other | Attending: Student in an Organized Health Care Education/Training Program | Admitting: Student in an Organized Health Care Education/Training Program

## 2017-08-08 ENCOUNTER — Encounter: Payer: Self-pay | Admitting: Radiology

## 2017-08-08 ENCOUNTER — Emergency Department: Payer: Medicare Other

## 2017-08-08 DIAGNOSIS — Z87891 Personal history of nicotine dependence: Secondary | ICD-10-CM | POA: Diagnosis not present

## 2017-08-08 DIAGNOSIS — I10 Essential (primary) hypertension: Secondary | ICD-10-CM | POA: Insufficient documentation

## 2017-08-08 DIAGNOSIS — Z8542 Personal history of malignant neoplasm of other parts of uterus: Secondary | ICD-10-CM | POA: Diagnosis not present

## 2017-08-08 DIAGNOSIS — Z91040 Latex allergy status: Secondary | ICD-10-CM | POA: Insufficient documentation

## 2017-08-08 DIAGNOSIS — N3000 Acute cystitis without hematuria: Secondary | ICD-10-CM | POA: Insufficient documentation

## 2017-08-08 DIAGNOSIS — R410 Disorientation, unspecified: Secondary | ICD-10-CM | POA: Diagnosis not present

## 2017-08-08 DIAGNOSIS — E039 Hypothyroidism, unspecified: Secondary | ICD-10-CM | POA: Insufficient documentation

## 2017-08-08 DIAGNOSIS — R51 Headache: Secondary | ICD-10-CM | POA: Diagnosis present

## 2017-08-08 DIAGNOSIS — Z79899 Other long term (current) drug therapy: Secondary | ICD-10-CM | POA: Diagnosis not present

## 2017-08-08 DIAGNOSIS — R42 Dizziness and giddiness: Secondary | ICD-10-CM | POA: Insufficient documentation

## 2017-08-08 DIAGNOSIS — R519 Headache, unspecified: Secondary | ICD-10-CM

## 2017-08-08 LAB — CBC
HCT: 40.2 % (ref 35.0–47.0)
HEMOGLOBIN: 13.6 g/dL (ref 12.0–16.0)
MCH: 28 pg (ref 26.0–34.0)
MCHC: 33.9 g/dL (ref 32.0–36.0)
MCV: 82.5 fL (ref 80.0–100.0)
Platelets: 219 10*3/uL (ref 150–440)
RBC: 4.87 MIL/uL (ref 3.80–5.20)
RDW: 14.4 % (ref 11.5–14.5)
WBC: 6.5 10*3/uL (ref 3.6–11.0)

## 2017-08-08 LAB — BASIC METABOLIC PANEL
Anion gap: 7 (ref 5–15)
BUN: 19 mg/dL (ref 6–20)
CHLORIDE: 99 mmol/L — AB (ref 101–111)
CO2: 30 mmol/L (ref 22–32)
CREATININE: 1.18 mg/dL — AB (ref 0.44–1.00)
Calcium: 8.9 mg/dL (ref 8.9–10.3)
GFR calc Af Amer: 58 mL/min — ABNORMAL LOW (ref >60)
GFR calc non Af Amer: 50 mL/min — ABNORMAL LOW (ref >60)
GLUCOSE: 100 mg/dL — AB (ref 65–99)
Potassium: 4.3 mmol/L (ref 3.5–5.1)
SODIUM: 136 mmol/L (ref 135–145)

## 2017-08-08 LAB — URINALYSIS, ROUTINE W REFLEX MICROSCOPIC
Bilirubin Urine: NEGATIVE
GLUCOSE, UA: NEGATIVE mg/dL
Hgb urine dipstick: NEGATIVE
KETONES UR: NEGATIVE mg/dL
Nitrite: POSITIVE — AB
PROTEIN: NEGATIVE mg/dL
RBC / HPF: NONE SEEN RBC/hpf (ref 0–5)
Specific Gravity, Urine: 1.023 (ref 1.005–1.030)
pH: 6 (ref 5.0–8.0)

## 2017-08-08 MED ORDER — CEFTRIAXONE SODIUM 1 G IJ SOLR: 1.0000 g | Freq: Once | INTRAMUSCULAR | Status: DC

## 2017-08-08 MED ORDER — DIPHENHYDRAMINE HCL 50 MG/ML IJ SOLN
12.5000 mg | Freq: Once | INTRAMUSCULAR | Status: AC
  Administered 2017-08-08: 12.5 mg via INTRAVENOUS
  Filled 2017-08-08: qty 1

## 2017-08-08 MED ORDER — ACETAMINOPHEN 500 MG PO TABS
1000.0000 mg | ORAL_TABLET | Freq: Once | ORAL | Status: AC
Start: 2017-08-08 — End: 2017-08-08
  Administered 2017-08-08: 1000 mg via ORAL
  Filled 2017-08-08: qty 2

## 2017-08-08 MED ORDER — SODIUM CHLORIDE 0.9 % IV BOLUS
1000.0000 mL | Freq: Once | INTRAVENOUS | Status: AC
  Administered 2017-08-08: 1000 mL via INTRAVENOUS

## 2017-08-08 MED ORDER — MAGNESIUM SULFATE 2 GM/50ML IV SOLN
2.0000 g | Freq: Once | INTRAVENOUS | Status: AC
  Administered 2017-08-08: 2 g via INTRAVENOUS
  Filled 2017-08-08: qty 50

## 2017-08-08 MED ORDER — FOSFOMYCIN TROMETHAMINE 3 G PO PACK
3.0000 g | PACK | Freq: Once | ORAL | Status: AC
  Administered 2017-08-08: 3 g via ORAL
  Filled 2017-08-08: qty 3

## 2017-08-08 MED ORDER — PROCHLORPERAZINE EDISYLATE 10 MG/2ML IJ SOLN
10.0000 mg | Freq: Once | INTRAMUSCULAR | Status: AC
  Administered 2017-08-08: 10 mg via INTRAVENOUS
  Filled 2017-08-08: qty 2

## 2017-08-08 MED ORDER — DEXAMETHASONE SODIUM PHOSPHATE 10 MG/ML IJ SOLN
10.0000 mg | Freq: Once | INTRAMUSCULAR | Status: AC
  Administered 2017-08-08: 10 mg via INTRAVENOUS
  Filled 2017-08-08: qty 1

## 2017-08-08 MED ORDER — IOHEXOL 350 MG/ML SOLN
75.0000 mL | Freq: Once | INTRAVENOUS | Status: AC | PRN
  Administered 2017-08-08: 75 mL via INTRAVENOUS

## 2017-08-08 NOTE — ED Notes (Signed)
First Nurse Note:  Patient to Room 16, Ally RN aware of placement.

## 2017-08-08 NOTE — Discharge Instructions (Signed)

## 2017-08-08 NOTE — ED Notes (Signed)
Assisted to and from bathroom.

## 2017-08-08 NOTE — ED Notes (Signed)
ED Provider at bedside. 

## 2017-08-08 NOTE — ED Provider Notes (Signed)
[facility] Emergency Department Provider Note    First MD Initiated Contact with Patient 08/08/17 1129     (approximate)  I have reviewed the triage vital signs and the nursing notes.   HISTORY  Chief Complaint Headache    HPI Robin Jensen is a 57 y.o. female presents with chief complaint of right-sided headache with some lightheadedness, confusion that started yesterday evening and progressively worse overnight.  She denies any numbness or tingling or weakness.  States it is not the worst headache of her life.  States it was gradual in onset.  No porta fevers.  No neck pain.  Has had some nausea but no vomiting.  No chest pain.  No blurry vision.  States that she does have a history of cerebral aneurysm status post clipping in the late 1990s.  That she has been compliant with all of her medications.  Past Medical History:  Diagnosis Date  . Aneurysm ([place])    head aneurysm   . Anxiety   . Bipolar 1 disorder ([place])   . Cancer ([place])    uterine  . Depression   . Dry mouth   . DVT of leg (deep venous thrombosis) ([facility])    history of  . Easy bruising   . Eye dryness   . Fatigue   . GERD (gastroesophageal reflux disease)   . Hyperlipidemia   . Hypertension   . Hypothyroid   . Migraine   . Migraine   . Seizure disorder ([place])   . Seizures ([place])    last year last seizure-had brain aneurysm. ON meds  . Stroke ([place]) 1997   No deficits  . Suicidal ideation    attempted overdoses in past   Family History  Problem Relation Age of Onset  . Colon cancer Father        less than age 56  . Colon cancer Paternal Uncle        44s  . Colon cancer Paternal Grandfather   . Breast cancer Maternal Aunt   . Diabetes Sister   . Arthritis Brother   . Hematuria Mother   . Kidney cancer Maternal Grandmother   . Kidney disease Maternal Grandmother   . Prostate cancer Neg Hx    Past Surgical History:  Procedure Laterality Date  . ABDOMINAL HYSTERECTOMY     . CEREBRAL ANEURYSM REPAIR  1997  . CESAREAN SECTION    . CHOLECYSTECTOMY    . COLONOSCOPY WITH PROPOFOL N/A 08/20/2012   Procedure: COLONOSCOPY WITH PROPOFOL;  Surgeon: [name], MD;  Location: [facility];  Service: Endoscopy;  Laterality: N/A;  In cecum at [place]; Total Withdrawal Time= 12 minutes  . ESOPHAGOGASTRODUODENOSCOPY (EGD) WITH PROPOFOL N/A 11/20/2016   Procedure: ESOPHAGOGASTRODUODENOSCOPY (EGD) WITH PROPOFOL;  Surgeon: [name], MD;  Location: [facility] ENDOSCOPY;  Service: Endoscopy;  Laterality: N/A;  . RADICAL HYSTERECTOMY     Patient Active Problem List   Diagnosis Date Noted  . Aneurysm, cerebral, nonruptured 08/14/2012  . Unspecified hypothyroidism 08/14/2012  . Other and unspecified hyperlipidemia 08/14/2012  . Seizures ([place]) 08/14/2012  . Insomnia 08/14/2012  . Unspecified constipation 08/07/2012  . FHx: colon cancer 08/07/2012  . Right sided abdominal pain 08/07/2012  . Essential hypertension, benign 08/07/2012  . Abnormal LFTs 08/07/2012      Prior to Admission medications   Medication Sig Start Date End Date Taking? Authorizing Provider  atorvastatin (LIPITOR) 40 MG tablet Take 40 mg by mouth at bedtime.  [provider]  butalbital-acetaminophen-caffeine (FIORICET, ESGIC) 50-325-40 MG tablet Take 1-2 tablets by mouth every 4 (four) hours as needed for headache.    [provider]  ciprofloxacin (CIPRO) 500 MG tablet Take 1 tablet (500 mg total) by mouth every 12 (twelve) hours. 06/22/17   Zara Council A, PA-C  conjugated estrogens (PREMARIN) vaginal cream Place 1 Applicatorful vaginally daily. Apply 0.5mg  (pea-sized amount)  just inside the vaginal introitus with a finger-tip on  Monday, Wednesday and Friday nights. 05/09/17   Zara Council A, PA-C  cyclobenzaprine (FLEXERIL) 10 MG tablet Take 5-10 mg by mouth every 8 (eight) hours as needed for muscle spasms.    [provider]  divalproex (DEPAKOTE) 250 MG DR tablet  Take 750 mg by mouth at bedtime.    [provider]  donepezil (ARICEPT) 10 MG tablet Take 10 mg by mouth at bedtime.    [provider]  EPINEPHrine 0.3 mg/0.3 mL IJ SOAJ injection Inject 0.3 mg into the muscle as needed. For anaphylactic reaction    [provider]  estradiol (ESTRACE VAGINAL) 0.1 MG/GM vaginal cream Apply 0.5mg  (pea-sized amount)  just inside the vaginal introitus with a finger-tip on Monday, Wednesday and Friday nights. 05/09/17   Zara Council A, PA-C  gabapentin (NEURONTIN) 300 MG capsule Take 300 mg by mouth 3 (three) times daily.    [provider]  hydrocortisone 2.5 % cream Apply 1 application topically 2 (two) times daily.    [provider]  labetalol (NORMODYNE) 200 MG tablet TK 3 TS PO BID 05/28/17   [provider]  labetalol (NORMODYNE) 300 MG tablet Take 300 mg by mouth 2 (two) times daily.    [provider]  lamoTRIgine (LAMICTAL) 200 MG tablet Take 250 mg by mouth daily.     [provider]  levothyroxine (SYNTHROID, LEVOTHROID) 75 MCG tablet Take 1 tablet (75 mcg total) by mouth daily before breakfast. 10/03/12   Lysbeth Penner, FNP  losartan (COZAAR) 50 MG tablet Take 1 tablet (50 mg total) by mouth daily. 01/02/14   Minus Breeding, MD  losartan-hydrochlorothiazide (HYZAAR) 100-25 MG per tablet Take 1 tablet by mouth daily. 10/03/12   Lysbeth Penner, FNP  mirabegron ER (MYRBETRIQ) 25 MG TB24 tablet Take 1 tablet (25 mg total) by mouth daily. 06/20/17   Zara Council A, PA-C  naproxen (NAPROSYN) 375 MG tablet naproxen 375 mg tablet  take 1 PO BID    [provider]  nitrofurantoin (MACRODANTIN) 100 MG capsule TK 1 C PO BID FOR UTI 05/05/17   [provider]  nitrofurantoin, macrocrystal-monohydrate, (MACROBID) 100 MG capsule Take 1 capsule (100 mg total) by mouth every 12 (twelve) hours. 07/02/17   Zara Council A, PA-C  nortriptyline (PAMELOR) 10 MG capsule  06/12/17    [provider]  omeprazole (PRILOSEC) 20 MG capsule Take 20 mg by mouth daily.    [provider]  ondansetron (ZOFRAN) 8 MG tablet Take by mouth every 8 (eight) hours as needed for nausea or vomiting.    [provider]  pantoprazole (PROTONIX) 40 MG tablet Take 40 mg by mouth 2 (two) times daily.    [provider]  phenytoin (DILANTIN) 100 MG ER capsule Take 200 mg by mouth 2 (two) times daily.    [provider]  promethazine (PHENERGAN) 12.5 MG tablet Take 1 tablet (12.5 mg total) by mouth every 8 (eight) hours as needed for nausea or vomiting. 07/20/15   Joanne Gavel,  MD  promethazine (PHENERGAN) 12.5 MG tablet Take 1 tablet (12.5 mg total) by mouth every 6 (six) hours as needed for nausea or vomiting. 01/07/16   Merlyn Lot, MD  sucralfate (CARAFATE) 1 g tablet Take 1 g by mouth 4 (four) times daily.    [provider]  Verapamil HCl CR 300 MG CP24 Take 1 capsule (300 mg total) by mouth daily. 10/03/12   Lysbeth Penner, FNP  VRAYLAR 4.5 MG CAPS TK ONE C PO QD 04/27/17   [provider]    Allergies Bee venom; Penicillins; Latex; Lisinopril; Other; and Sulfa antibiotics    Social History Social History   Tobacco Use  . Smoking status: Former Smoker    Last attempt to quit: 08/15/2005    Years since quitting: 11.9  . Smokeless tobacco: Never Used  Substance Use Topics  . 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 Drug use: No    Comment: H/O marijuana and cocaine, in 1980s    Review of Systems Patient denies headaches, rhinorrhea, blurry vision, numbness, shortness of breath, chest pain, edema, cough, abdominal pain, nausea, vomiting, diarrhea, dysuria, fevers, rashes or hallucinations unless otherwise stated above in HPI. ____________________________________________   PHYSICAL EXAM:  VITAL SIGNS: Vitals:   08/08/17 1330 08/08/17 1430  BP: 136/84 114/76  Pulse: (!) 54 (!) 53  Resp: 11 11    Temp:    SpO2: 97% 95%    Constitutional: Alert and oriented.  in no acute distress. Eyes: Conjunctivae are normal.  Head: Atraumatic. Nose: No congestion/rhinnorhea. Mouth/Throat: Mucous membranes are moist.   Neck: No stridor. Painless ROM.  Cardiovascular: Normal rate, regular rhythm. Grossly normal heart sounds.  Good peripheral circulation. Respiratory: Normal respiratory effort.  No retractions. Lungs CTAB. Gastrointestinal: Soft and nontender. No distention. No abdominal bruits. No CVA tenderness. Genitourinary:  Musculoskeletal: No lower extremity tenderness nor edema.  No joint effusions. Neurologic:  CN- intact.  No facial droop, Normal FNF.  Normal heel to shin.  Sensation intact bilaterally. Normal speech and language. No gross focal neurologic deficits are appreciated. No gait instability. Skin:  Skin is warm, dry and intact. No rash noted. Psychiatric: Mood and affect are normal. Speech and behavior are normal.  ____________________________________________   LABS (all labs ordered are listed, but only abnormal results are displayed)  Results for orders placed or performed during the hospital encounter of 08/08/17 (from the past 24 hour(s))  CBC     Status: None   Collection Time: 08/08/17 11:25 AM  Result Value Ref Range   WBC 6.5 3.6 - 11.0 K/uL   RBC 4.87 3.80 - 5.20 MIL/uL   Hemoglobin 13.6 12.0 - 16.0 g/dL   HCT 40.2 35.0 - 47.0 %   MCV 82.5 80.0 - 100.0 fL   MCH 28.0 26.0 - 34.0 pg   MCHC 33.9 32.0 - 36.0 g/dL   RDW 14.4 11.5 - 14.5 %   Platelets 219 150 - 440 K/uL  Basic metabolic panel     Status: Abnormal   Collection Time: 08/08/17 11:25 AM  Result Value Ref Range   Sodium 136 135 - 145 mmol/L   Potassium 4.3 3.5 - 5.1 mmol/L   Chloride 99 (L) 101 - 111 mmol/L   CO2 30 22 - 32 mmol/L   Glucose, Bld 100 (H) 65 - 99 mg/dL   BUN 19 6 - 20 mg/dL   Creatinine, Ser 1.18 (H) 0.44 - 1.00 mg/dL   Calcium 8.9 8.9 -  10.3 mg/dL   GFR calc non Af Amer 50  (L) >60 mL/min   GFR calc Af Amer 58 (L) >60 mL/min   Anion gap 7 5 - 15  Urinalysis, Routine w reflex microscopic     Status: Abnormal   Collection Time: 08/08/17 11:25 AM  Result Value Ref Range   Color, Urine YELLOW (A) YELLOW   APPearance HAZY (A) CLEAR   Specific Gravity, Urine 1.023 1.005 - 1.030   pH 6.0 5.0 - 8.0   Glucose, UA NEGATIVE NEGATIVE mg/dL   Hgb urine dipstick NEGATIVE NEGATIVE   Bilirubin Urine NEGATIVE NEGATIVE   Ketones, ur NEGATIVE NEGATIVE mg/dL   Protein, ur NEGATIVE NEGATIVE mg/dL   Nitrite POSITIVE (A) NEGATIVE   Leukocytes, UA MODERATE (A) NEGATIVE   RBC / HPF NONE SEEN 0 - 5 RBC/hpf   WBC, UA TOO NUMEROUS TO COUNT 0 - 5 WBC/hpf   Bacteria, UA MANY (A) NONE SEEN   Squamous Epithelial / LPF 0-5 (A) NONE SEEN   Mucus PRESENT    ____________________________________________  EKG My review and personal interpretation at Time: 11:12   Indication: headache  Rate: 60  Rhythm: sinus Axis: normal Other: normal intervals, no stemi ____________________________________________  RADIOLOGY  I personally reviewed all radiographic images ordered to evaluate for the above acute complaints and reviewed radiology reports and findings.  These findings were personally discussed with the patient.  Please see medical record for radiology report.  ____________________________________________   PROCEDURES  Procedure(s) performed:  Procedures    Critical Care performed: no ____________________________________________   INITIAL IMPRESSION / ASSESSMENT AND PLAN / ED COURSE  Pertinent labs & imaging results that were available during my care of the patient were reviewed by me and considered in my medical decision making (see chart for details).  DDX: migraine, tension, htn, sah, iph, mass, gca  Robin Jensen is a 57 y.o. who presents to the ED with blankets as described above.  Patient well-appearing and nontoxic.  Nonfocal neuro exam.  Given her history  will more extensively evaluate for underlying pathology with CT imaging particular to rule out recurrence of aneurysm.  Seems less consistent with subarachnoid given gradual onset with no neuro deficits and no significant toxicity or evidence of meningeal irritation.  Will provide IV pain medication and reassess.  Clinical Course as of Aug 08 1498  Wed Aug 08, 2017  1251 CT angiogram of the head shows no evidence of recurrent aneurysm or bleed.  No signs or symptoms of stroke.  We will continue with treating for migraine.   [PR]  1432 Headache resolved.  Patient resting comfortably.  Will start patient on fosfomycin as she has allergies to Rocephin.  Patient was able to tolerate PO and was able to ambulate with a steady gait.  Have discussed with the patient and available family all diagnostics and treatments performed thus far and all questions were answered to the best of my ability. The patient demonstrates understanding and agreement with plan.    [PR]    Clinical Course User Index [PR] Merlyn Lot, MD     As part of my medical decision making, I reviewed the following data within the [place] notes reviewed and incorporated, Labs reviewed, notes from prior ED visits and [place] Controlled Substance Database   ____________________________________________   FINAL CLINICAL IMPRESSION(S) / ED DIAGNOSES  Final diagnoses:  Bad headache  Acute cystitis without hematuria      NEW MEDICATIONS STARTED DURING THIS VISIT:  New Prescriptions   No medications on file     Note:  This document was prepared using Dragon voice recognition software and may include unintentional dictation errors.    Merlyn Lot, MD 08/08/17 1500

## 2017-08-08 NOTE — ED Triage Notes (Signed)
Pt sent here from [facility] with c/o slight confusion, right sided headache and states she is tired, has a hx of aneurysm and states her symptoms where similar. Grips weak bilaterally, no facial droop noted, speech is slow but clear, no arm drift.

## 2017-08-08 NOTE — ED Notes (Signed)
Pt in CT.

## 2017-08-08 NOTE — ED Notes (Signed)
Patient transported to CT via stretcher.

## 2017-08-09 ENCOUNTER — Encounter
Admission: RE | Admit: 2017-08-09 | Discharge: 2017-08-09 | Disposition: A | Discharge status: Home / Self Care | Payer: Medicare Other | Source: Ambulatory Visit | Attending: Gastroenterology | Admitting: Gastroenterology

## 2017-08-09 DIAGNOSIS — R1013 Epigastric pain: Secondary | ICD-10-CM | POA: Diagnosis present

## 2017-08-09 MED ORDER — TECHNETIUM TC 99M SULFUR COLLOID
2.0000 | Freq: Once | INTRAVENOUS | Status: AC | PRN
  Administered 2017-08-09: 1.54 via ORAL

## 2017-09-19 NOTE — Progress Notes (Deleted)
09/20/2017 9:50 PM   Robin Jensen 11-09-1960 [id number]  Referring provider: Donnie Coffin, MD [address], [place] [address]  No chief complaint on file.   HPI: 57 year old Caucasian female with recurrent UTIs, vaginal atrophy and mixed urinary incontinence who presents today for follow up.    Background history Patient is a 76 -year-old Caucasian female who is referred to [name] by Robin Jensen, AGNP for recurrent urinary tract infections.  Patient states that she has had two urinary tract infections over the last year and 5 the year before.  Reviewing her records,  she has had no documented UTI's available to me at the time of the visit.    Her symptoms with a urinary tract infection consist of urgency, frequency and urge incontinence.   She endorses dysuria, suprapubic pain and back pain with UTI.   She has not had any recent fevers, chills, nausea or vomiting.  She does not have a history of nephrolithiasis, GU surgery or GU trauma.  She is not sexually active.  She is postmenopausal.  She admits to constipation.    She does engage in good perineal hygiene. She does not take tub baths.  She is having frequency, nocturia x2 and intermittency.  She has incontinence.  She is using incontinence pads.  She is going through 5 depends daily.   Her CATH UA today was negative.  Her PVR is 150 mL.  She has been on two days of Macrobid .was prescribed by her PCP for an UTI.  She is not having pain with bladder filling.  She has not had any recent imaging studies.  She is not drinking water.  She drinks diet Mountain Dew (4L daily) and sometimes coffee.  No juices.  Quit smoking 15 years ago.    At her visit on May 09, 2017, we discussed UTI prevention strategies, she was initiated on vaginal estrogen cream for her vaginal atrophy, she was referred to physical therapy and started on Myrbetriq 25 mg daily.  On 06/20/2017, she stated that she felt that she may be developing a  urinary tract infection.  She stated that her symptoms started 3 days ago.  She was having suprapubic discomfort, urgency and dysuria.  Her UA was positive for nitrates, greater than 30 WBCs, 3-10 RBCs and many bacteria.  Patient denied any gross hematuria, dysuria or suprapubic/flank pain.  Patient denies any fevers, chills, nausea or vomiting. Her urine culture was positive for pan-sensitive e.coli.  The patient has been experiencing urgency x 4-7, frequency x 8 or more, is restricting fluids to avoid visits to the restroom, is engaging in toilet mapping, incontinence x 4-7 and nocturia x 4-7.   Her PVR is 52 mL.  Her BP is 148/101.  She states that she found the Myrbetriq samples helpful as her symptoms returned after she ran out of her samples 2 weeks ago.  She has added more water to her intake and has decreased her [facility] intake drastically.  She is now experiencing headaches.   She could not take the Myrbetriq due to the interaction with Pamelor causing possible QT prolongation.   She was then switched to oxybutynin XL 15 mg daily.  She is applying the vaginal estrogen cream three nights weekly.    She has started PT therapy, but she did not complete therapy.    PMH: Past Medical History:  Diagnosis Date  . 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 Anxiety   .  Bipolar 1 disorder ([place])   . Cancer ([place])    uterine  . Depression   . Dry mouth   . DVT of leg (deep venous thrombosis) ([facility])    history of  . Easy bruising   . Eye dryness   . Fatigue   . GERD (gastroesophageal reflux disease)   . Hyperlipidemia   . Hypertension   . Hypothyroid   . Migraine   . Migraine   . Seizure disorder ([place])   . Seizures ([place])    last year last seizure-had brain aneurysm. ON meds  . Stroke ([name]) 1997   No deficits  . Suicidal ideation    attempted overdoses in past    Surgical History: Past Surgical History:  Procedure Laterality Date  . ABDOMINAL HYSTERECTOMY    . CEREBRAL ANEURYSM  REPAIR  1997  . CESAREAN SECTION    . CHOLECYSTECTOMY    . COLONOSCOPY WITH PROPOFOL N/A 08/20/2012   Procedure: COLONOSCOPY WITH PROPOFOL;  Surgeon: [name], MD;  Location: [facility];  Service: Endoscopy;  Laterality: N/A;  In cecum at [place]; Total Withdrawal Time= 12 minutes  . ESOPHAGOGASTRODUODENOSCOPY (EGD) WITH PROPOFOL N/A 11/20/2016   Procedure: ESOPHAGOGASTRODUODENOSCOPY (EGD) WITH PROPOFOL;  Surgeon: [name], MD;  Location: [facility] ENDOSCOPY;  Service: Endoscopy;  Laterality: N/A;  . RADICAL HYSTERECTOMY      Home Medications:  Allergies as of 09/20/2017      Reactions   Bee Venom Anaphylaxis   Penicillins Shortness Of Breath, Rash   Latex    Lisinopril    Other    Sulfa Antibiotics       Medication List        Accurate as of 09/19/17  9:50 PM. Always use your most recent med list.          atorvastatin 40 MG tablet Commonly known as:  LIPITOR Take 40 mg by mouth at bedtime.   butalbital-acetaminophen-caffeine 50-325-40 MG tablet Commonly known as:  FIORICET, ESGIC Take 1-2 tablets by mouth every 4 (four) hours as needed for headache.   ciprofloxacin 500 MG tablet Commonly known as:  CIPRO Take 1 tablet (500 mg total) by mouth every 12 (twelve) hours.   conjugated estrogens vaginal cream Commonly known as:  PREMARIN Place 1 Applicatorful vaginally daily. Apply 0.5mg  (pea-sized amount)  just inside the vaginal introitus with a finger-tip on  Monday, Wednesday and Friday nights.   cyclobenzaprine 10 MG tablet Commonly known as:  FLEXERIL Take 5-10 mg by mouth every 8 (eight) hours as needed for muscle spasms.   divalproex 250 MG DR tablet Commonly known as:  DEPAKOTE Take 750 mg by mouth at bedtime.   donepezil 10 MG tablet Commonly known as:  ARICEPT Take 10 mg by mouth at bedtime.   EPINEPHrine 0.3 mg/0.3 mL Soaj injection Commonly known as:  EPI-PEN Inject 0.3 mg into the muscle as needed. For anaphylactic reaction   estradiol 0.1 MG/GM  vaginal cream Commonly known as:  ESTRACE VAGINAL Apply 0.5mg  (pea-sized amount)  just inside the vaginal introitus with a finger-tip on Monday, Wednesday and Friday nights.   gabapentin 300 MG capsule Commonly known as:  NEURONTIN Take 300 mg by mouth 3 (three) times daily.   hydrocortisone 2.5 % cream Apply 1 application topically 2 (two) times daily.   labetalol 300 MG tablet Commonly known as:  NORMODYNE Take 300 mg by mouth 2 (two) times daily.   labetalol 200 MG tablet Commonly known as:  NORMODYNE TK 3 TS  PO BID   lamoTRIgine 200 MG tablet Commonly known as:  LAMICTAL Take 250 mg by mouth daily.   levothyroxine 75 MCG tablet Commonly known as:  SYNTHROID, LEVOTHROID Take 1 tablet (75 mcg total) by mouth daily before breakfast.   losartan 50 MG tablet Commonly known as:  COZAAR Take 1 tablet (50 mg total) by mouth daily.   losartan-hydrochlorothiazide 100-25 MG tablet Commonly known as:  HYZAAR Take 1 tablet by mouth daily.   mirabegron ER 25 MG Tb24 tablet Commonly known as:  MYRBETRIQ Take 1 tablet (25 mg total) by mouth daily.   naproxen 375 MG tablet Commonly known as:  NAPROSYN naproxen 375 mg tablet  take 1 PO BID   nitrofurantoin (macrocrystal-monohydrate) 100 MG capsule Commonly known as:  MACROBID Take 1 capsule (100 mg total) by mouth every 12 (twelve) hours.   nitrofurantoin 100 MG capsule Commonly known as:  MACRODANTIN TK 1 C PO BID FOR UTI   nortriptyline 10 MG capsule Commonly known as:  PAMELOR   omeprazole 20 MG capsule Commonly known as:  PRILOSEC Take 20 mg by mouth daily.   ondansetron 8 MG tablet Commonly known as:  ZOFRAN Take by mouth every 8 (eight) hours as needed for nausea or vomiting.   pantoprazole 40 MG tablet Commonly known as:  PROTONIX Take 40 mg by mouth 2 (two) times daily.   phenytoin 100 MG ER capsule Commonly known as:  DILANTIN Take 200 mg by mouth 2 (two) times daily.   promethazine 12.5 MG  tablet Commonly known as:  PHENERGAN Take 1 tablet (12.5 mg total) by mouth every 8 (eight) hours as needed for nausea or vomiting.   promethazine 12.5 MG tablet Commonly known as:  PHENERGAN Take 1 tablet (12.5 mg total) by mouth every 6 (six) hours as needed for nausea or vomiting.   sucralfate 1 g tablet Commonly known as:  CARAFATE Take 1 g by mouth 4 (four) times daily.   Verapamil HCl CR 300 MG Cp24 Take 1 capsule (300 mg total) by mouth daily.   VRAYLAR 4.5 MG Caps Generic drug:  Cariprazine HCl TK ONE C PO QD       Allergies:  Allergies  Allergen Reactions  . Bee Venom Anaphylaxis  . Penicillins Shortness Of Breath and Rash  . Latex   . Lisinopril   . Other   . Sulfa Antibiotics     Family History: Family History  Problem Relation Age of Onset  . Colon cancer Father        less than age 3  . Colon cancer Paternal Uncle        63s  . Colon cancer Paternal Grandfather   . Breast cancer Maternal Aunt   . Diabetes Sister   . Arthritis Brother   . Hematuria Mother   . Kidney cancer Maternal Grandmother   . Kidney disease Maternal Grandmother   . Prostate cancer Neg Hx     Social History:  reports that she quit smoking about 12 years ago. She has never used smokeless tobacco. She reports that she does not drink alcohol or use drugs.  ROS:                                        Physical Exam: There were no vitals taken for this visit.  Constitutional: Well nourished. Alert and oriented, No acute distress. HEENT: [place] AT, moist  mucus membranes. Trachea midline, no masses. Cardiovascular: No clubbing, cyanosis, or edema. Respiratory: Normal respiratory effort, no increased work of breathing. GI: Abdomen is soft, non tender, non distended, no abdominal masses. Liver and spleen not palpable.  No hernias appreciated.  Stool sample for occult testing is not indicated.   GU: No CVA tenderness.  No bladder fullness or masses.  Normal  external genitalia, normal pubic hair distribution, no lesions.  Normal urethral meatus, no lesions, no prolapse, no discharge.   No urethral masses, tenderness and/or tenderness. No bladder fullness, tenderness or masses. Normal vagina mucosa, good estrogen effect, no discharge, no lesions, good pelvic support, no cystocele or rectocele noted.  No cervical motion tenderness.  Uterus is freely mobile and non-fixed.  No adnexal/parametria masses or tenderness noted.  Anus and perineum are without rashes or lesions.   *** Skin: No rashes, bruises or suspicious lesions. Lymph: No cervical or inguinal adenopathy. Neurologic: Grossly intact, no focal deficits, moving all 4 extremities. Psychiatric: Normal mood and affect.   Laboratory Data: Lab Results  Component Value Date   WBC 6.5 08/08/2017   HGB 13.6 08/08/2017   HCT 40.2 08/08/2017   MCV 82.5 08/08/2017   PLT 219 08/08/2017    Lab Results  Component Value Date   CREATININE 1.18 (H) 08/08/2017    No results found for: PSA  No results found for: TESTOSTERONE  Lab Results  Component Value Date   HGBA1C  08/21/2007    5.0 (NOTE)   The ADA recommends the following therapeutic goals for glycemic   control related to Hgb A1C measurement:   Goal of Therapy:   < 7.0% Hgb A1C   Action Suggested:  > 8.0% Hgb A1C   Ref:  Diabetes Care, 22, Suppl. 1, 1999    Lab Results  Component Value Date   TSH 2.015 07/21/2010       Component Value Date/Time   CHOL (H) 07/22/2010 0740    222        ATP III CLASSIFICATION:  <200     mg/dL   Desirable  200-239  mg/dL   Borderline High  >=240    mg/dL   High          HDL 37 (L) 07/22/2010 0740   CHOLHDL 6.0 07/22/2010 0740   VLDL 78 (H) 07/22/2010 0740   LDLCALC (H) 07/22/2010 0740    107        Total Cholesterol/HDL:CHD Risk Coronary Heart Disease Risk Table                     Men   Women  1/2 Average Risk   3.4   3.3  Average Risk       5.0   4.4  2 X Average Risk   9.6   7.1  3 X  Average Risk  23.4   11.0        Use the calculated Patient Ratio above and the CHD Risk Table to determine the patient's CHD Risk.        ATP III CLASSIFICATION (LDL):  <100     mg/dL   Optimal  100-129  mg/dL   Near or Above                    Optimal  130-159  mg/dL   Borderline  160-189  mg/dL   High  >190     mg/dL   Very High    Lab Results  Component Value Date   AST 31 07/20/2015   Lab Results  Component Value Date   ALT 26 07/20/2015   No components found for: ALKALINEPHOPHATASE No components found for: BILIRUBINTOTAL  No results found for: ESTRADIOL  Urinalysis *** See Epic.   I have reviewed the labs.   Assessment & Plan:    1. Recurrent UTI's  -UA is suspicious for UTI - sent for culture  -start nitrofurantoin empirically - will adjust when ucx & sensitivities are available if necessary  -Reviewed UTI prevention strategies  2. Vaginal atrophy  -continue applying the vaginal estrogen cream three nights weekly  - She will follow up in three months for an exam.    3. Mixed incontinence  - found the Myrbetriq 25 mg daily helpful - script sent to pharmacy  - RTC in 3 months for OAB questionnaire, PVR and exam                                      No follow-ups on file.  These notes generated with voice recognition software. I apologize for typographical errors.  Zara Council, PA-C  [facility] [address] [place], [place] [address] [phone]

## 2017-09-20 ENCOUNTER — Ambulatory Visit: Payer: Medicare Other | Admitting: Urology

## 2017-09-20 ENCOUNTER — Encounter: Payer: Self-pay | Admitting: Urology

## 2017-09-27 ENCOUNTER — Encounter: Payer: Self-pay | Admitting: *Deleted

## 2017-09-28 ENCOUNTER — Ambulatory Visit: Payer: Medicare Other | Admitting: Certified Registered Nurse Anesthetist

## 2017-09-28 ENCOUNTER — Encounter: Payer: Self-pay | Admitting: Certified Registered Nurse Anesthetist

## 2017-09-28 ENCOUNTER — Encounter
Admission: RE | Disposition: A | Discharge status: Home / Self Care | Payer: Self-pay | Source: Ambulatory Visit | Attending: Gastroenterology

## 2017-09-28 ENCOUNTER — Ambulatory Visit
Admission: RE | Admit: 2017-09-28 | Discharge: 2017-09-28 | Disposition: A | Discharge status: Home / Self Care | Payer: Medicare Other | Source: Ambulatory Visit | Attending: Gastroenterology | Admitting: Gastroenterology

## 2017-09-28 DIAGNOSIS — Z8542 Personal history of malignant neoplasm of other parts of uterus: Secondary | ICD-10-CM | POA: Diagnosis not present

## 2017-09-28 DIAGNOSIS — K219 Gastro-esophageal reflux disease without esophagitis: Secondary | ICD-10-CM | POA: Diagnosis not present

## 2017-09-28 DIAGNOSIS — F319 Bipolar disorder, unspecified: Secondary | ICD-10-CM | POA: Diagnosis not present

## 2017-09-28 DIAGNOSIS — Z888 Allergy status to other drugs, medicaments and biological substances status: Secondary | ICD-10-CM | POA: Insufficient documentation

## 2017-09-28 DIAGNOSIS — Z91030 Bee allergy status: Secondary | ICD-10-CM | POA: Diagnosis not present

## 2017-09-28 DIAGNOSIS — F419 Anxiety disorder, unspecified: Secondary | ICD-10-CM | POA: Diagnosis not present

## 2017-09-28 DIAGNOSIS — I10 Essential (primary) hypertension: Secondary | ICD-10-CM | POA: Insufficient documentation

## 2017-09-28 DIAGNOSIS — Z882 Allergy status to sulfonamides status: Secondary | ICD-10-CM | POA: Diagnosis not present

## 2017-09-28 DIAGNOSIS — E039 Hypothyroidism, unspecified: Secondary | ICD-10-CM | POA: Insufficient documentation

## 2017-09-28 DIAGNOSIS — I671 Cerebral aneurysm, nonruptured: Secondary | ICD-10-CM | POA: Insufficient documentation

## 2017-09-28 DIAGNOSIS — G40909 Epilepsy, unspecified, not intractable, without status epilepticus: Secondary | ICD-10-CM | POA: Diagnosis not present

## 2017-09-28 DIAGNOSIS — E785 Hyperlipidemia, unspecified: Secondary | ICD-10-CM | POA: Diagnosis not present

## 2017-09-28 DIAGNOSIS — Z91040 Latex allergy status: Secondary | ICD-10-CM | POA: Diagnosis not present

## 2017-09-28 DIAGNOSIS — G43909 Migraine, unspecified, not intractable, without status migrainosus: Secondary | ICD-10-CM | POA: Insufficient documentation

## 2017-09-28 DIAGNOSIS — Z79899 Other long term (current) drug therapy: Secondary | ICD-10-CM | POA: Insufficient documentation

## 2017-09-28 DIAGNOSIS — Z86718 Personal history of other venous thrombosis and embolism: Secondary | ICD-10-CM | POA: Insufficient documentation

## 2017-09-28 DIAGNOSIS — Z1211 Encounter for screening for malignant neoplasm of colon: Secondary | ICD-10-CM | POA: Diagnosis present

## 2017-09-28 DIAGNOSIS — Z87891 Personal history of nicotine dependence: Secondary | ICD-10-CM | POA: Insufficient documentation

## 2017-09-28 DIAGNOSIS — Z880 Allergy status to penicillin: Secondary | ICD-10-CM | POA: Diagnosis not present

## 2017-09-28 HISTORY — PX: COLONOSCOPY WITH PROPOFOL: SHX5780

## 2017-09-28 HISTORY — PX: ESOPHAGOGASTRODUODENOSCOPY (EGD) WITH PROPOFOL: SHX5813

## 2017-09-28 SURGERY — COLONOSCOPY WITH PROPOFOL
Anesthesia: General

## 2017-09-28 MED ORDER — PROPOFOL 10 MG/ML IV BOLUS
INTRAVENOUS | Status: DC | PRN
  Administered 2017-09-28: 20 mg via INTRAVENOUS
  Administered 2017-09-28: 50 mg via INTRAVENOUS

## 2017-09-28 MED ORDER — PROPOFOL 10 MG/ML IV BOLUS
INTRAVENOUS | Status: AC
Start: 2017-09-28 — End: 2017-09-28
  Filled 2017-09-28: qty 20

## 2017-09-28 MED ORDER — LIDOCAINE HCL (CARDIAC) PF 100 MG/5ML IV SOSY
PREFILLED_SYRINGE | INTRAVENOUS | Status: DC | PRN
  Administered 2017-09-28: 50 mg via INTRAVENOUS

## 2017-09-28 MED ORDER — PROPOFOL 500 MG/50ML IV EMUL
INTRAVENOUS | Status: DC | PRN
  Administered 2017-09-28: 90 ug/kg/min via INTRAVENOUS

## 2017-09-28 MED ORDER — PROPOFOL 500 MG/50ML IV EMUL
INTRAVENOUS | Status: AC
  Filled 2017-09-28: qty 50

## 2017-09-28 MED ORDER — LACTATED RINGERS IV SOLN
INTRAVENOUS | Status: DC | PRN
  Administered 2017-09-28: 11:00:00 via INTRAVENOUS

## 2017-09-28 MED ORDER — PROPOFOL 10 MG/ML IV BOLUS
INTRAVENOUS | Status: AC
  Filled 2017-09-28: qty 20

## 2017-09-28 MED ORDER — SODIUM CHLORIDE 0.9 % IV SOLN
INTRAVENOUS | Status: DC
  Administered 2017-09-28: 1000 mL via INTRAVENOUS

## 2017-09-28 NOTE — Op Note (Signed)
[facility] Gastroenterology Patient Name: Robin Jensen Procedure Date: 09/28/2017 10:34 AM MRN: [id number] Account #: [id number] Date of Birth: 01/04/1961 Admit Type: Outpatient Age: 57 Room: [facility] ENDO ROOM 3 Gender: Female Note Status: Finalized Procedure:            Colonoscopy Indications:          Screening for colorectal malignant neoplasm Providers:            [name], MD Referring MD:         Edmonia Lynch. Aycock MD (Referring MD) Complications:        No immediate complications. Procedure:            Pre-Anesthesia Assessment:                       - ASA Grade Assessment: III - A patient with severe                        systemic disease.                       After obtaining informed consent, the colonoscope was                        passed under direct vision. Throughout the procedure,                        the patient's blood pressure, pulse, and oxygen                        saturations were monitored continuously. The                        Colonoscope was introduced through the anus and                        advanced to the the cecum, identified by appendiceal                        orifice and ileocecal valve. The colonoscopy was                        performed with moderate difficulty due to poor bowel                        prep. Successful completion of the procedure was aided                        by using manual pressure and lavage. The quality of the                        bowel preparation was fair. Findings:      The colon (entire examined portion) appeared normal.      The retroflexed view of the distal rectum and anal verge was normal and       showed no anal or rectal abnormalities.      The digital rectal exam was normal. Impression:           - Preparation of the colon was fair.                       -  The entire examined colon is normal.                       - The distal rectum and anal verge are normal on               retroflexion view.                       - No specimens collected. Recommendation:       - Discharge patient to home.                       - Repeat colonoscopy in 5-10 years for screening                        purposes.                       - Return to GI clinic in 3 weeks. Procedure Code(s):    --- Professional ---                       [phone], Colonoscopy, flexible; diagnostic, including                        collection of specimen(s) by brushing or washing, when                        performed (separate procedure) Diagnosis Code(s):    --- Professional ---                       Z12.11, Encounter for screening for malignant neoplasm                        of colon CPT copyright 2017 American Medical Association. All rights reserved. The codes documented in this report are preliminary and upon coder review may  be revised to meet current compliance requirements. [name], MD 09/28/2017 11:30:28 AM This report has been signed electronically. Number of Addenda: 0 Note Initiated On: 09/28/2017 10:34 AM Scope Withdrawal Time: 0 hours 9 minutes 31 seconds  Total Procedure Duration: 0 hours 20 minutes 39 seconds       [facility]

## 2017-09-28 NOTE — Anesthesia Preprocedure Evaluation (Signed)
Anesthesia Evaluation  Patient identified by MRN, date of birth, ID band Patient awake    Reviewed: Allergy & Precautions, NPO status , Patient's Chart, lab work & pertinent test results  History of Anesthesia Complications Negative for: history of anesthetic complications  Airway Mallampati: II  TM Distance: >3 FB Neck ROM: Full    Dental  (+) Missing, Poor Dentition   Pulmonary neg sleep apnea, neg COPD, Current Smoker, former smoker,    breath sounds clear to auscultation- rhonchi (-) wheezing      Cardiovascular hypertension, + Peripheral Vascular Disease  (-) CAD, (-) Past MI and (-) Cardiac Stents  Rhythm:Regular Rate:Normal - Systolic murmurs and - Diastolic murmurs    Neuro/Psych  Headaches, Seizures -,  PSYCHIATRIC DISORDERS Anxiety Depression Bipolar Disorder CVA, No Residual Symptoms    GI/Hepatic Neg liver ROS, GERD  ,  Endo/Other  neg diabetesHypothyroidism   Renal/GU negative Renal ROS     Musculoskeletal negative musculoskeletal ROS (+)   Abdominal (+) - obese,   Peds  Hematology negative hematology ROS (+)   Anesthesia Other Findings Past Medical History: No date: Anxiety No date: Bipolar 1 disorder (HCC) No date: Cancer [facility])     Comment:  uterine No date: Depression No date: DVT of leg (deep venous thrombosis) (HCC)     Comment:  history of No date: GERD (gastroesophageal reflux disease) No date: Hyperlipidemia No date: Hypertension No date: Hypothyroid No date: Migraine No date: Seizure disorder ([place]) No date: Seizures ([place])     Comment:  last year last seizure-had brain aneurysm. ON meds 1997: Stroke [facility])     Comment:  No deficits No date: Suicidal ideation     Comment:  attempted overdoses in past   Reproductive/Obstetrics                             Anesthesia Physical  Anesthesia Plan  ASA: III  Anesthesia Plan: General   Post-op Pain Management:     Induction: Intravenous  PONV Risk Score and Plan: 2 and Propofol infusion  Airway Management Planned: Natural Airway  Additional Equipment:   Intra-op Plan:   Post-operative Plan:   Informed Consent: I have reviewed the patients History and Physical, chart, labs and discussed the procedure including the risks, benefits and alternatives for the proposed anesthesia with the patient or authorized representative who has indicated his/her understanding and acceptance.   Dental advisory given  Plan Discussed with: CRNA and Anesthesiologist  Anesthesia Plan Comments:         Anesthesia Quick Evaluation

## 2017-09-28 NOTE — Anesthesia Post-op Follow-up Note (Signed)
Anesthesia QCDR form completed.        

## 2017-09-28 NOTE — Op Note (Signed)
[facility] Gastroenterology Patient Name: Robin Jensen Procedure Date: 09/28/2017 10:35 AM MRN: [id number] Account #: [id number] Date of Birth: Aug 29, 1960 Admit Type: Outpatient Age: 57 Room: [facility] ENDO ROOM 3 Gender: Female Note Status: Finalized Procedure:            Upper GI endoscopy Indications:          Epigastric abdominal pain, Dyspepsia Providers:            [name], MD Referring MD:         Ngwe A. Aycock MD (Referring MD) Medicines:            Monitored Anesthesia Care Complications:        No immediate complications. Procedure:            Pre-Anesthesia Assessment:                       - ASA Grade Assessment: III - A patient with severe                        systemic disease.                       After obtaining informed consent, the endoscope was                        passed under direct vision. Throughout the procedure,                        the patient's blood pressure, pulse, and oxygen                        saturations were monitored continuously. The Endoscope                        was introduced through the mouth, and advanced to the                        third part of duodenum. The upper GI endoscopy was                        accomplished without difficulty. The patient tolerated                        the procedure well. Findings:      The Z-line was variable. Biopsies were taken with a cold forceps for       histology.      A small hiatal hernia was found. The Z-line was a variable distance from       incisors; the hiatal hernia was sliding.      Patchy moderate inflammation characterized by congestion (edema) and       erythema was found in the gastric body. Biopsies were taken with a cold       forceps for histology. Biopsies were taken with a cold forceps for       Helicobacter pylori testing.      Diffuse and patchy moderate inflammation characterized by congestion       (edema), erosions and erythema was found  at the incisura and in the       gastric antrum. Biopsies were taken with a cold forceps for histology.  Biopsies were taken with a cold forceps for Helicobacter pylori testing.      The examined duodenum was normal. Of note, there was no residual food       throughout.      The cardia and gastric fundus were normal on retroflexion otherwise,       note gastritis and hiatal hernia. Impression:           - Z-line variable. Biopsied.                       - Small hiatal hernia.                       - Gastritis. Biopsied.                       - Erosive gastritis. Biopsied.                       - Normal examined duodenum. Recommendation:       - Use Protonix (pantoprazole) 40 mg PO BID daily.                       - No aspirin, ibuprofen, naproxen, or other                        non-steroidal anti-inflammatory drugs.                       - Await pathology results.                       - Return to GI clinic at appointment to be scheduled.                       - Low residue diet indefinitely.                       - Return to GI clinic in 1 month. Procedure Code(s):    --- Professional ---                       [phone], Esophagogastroduodenoscopy, flexible, transoral;                        with biopsy, single or multiple Diagnosis Code(s):    --- Professional ---                       K22.8, Other specified diseases of esophagus                       K44.9, Diaphragmatic hernia without obstruction or                        gangrene                       K29.70, Gastritis, unspecified, without bleeding                       K29.60, Other gastritis without bleeding                       R10.13, Epigastric pain CPT copyright 2017 American Medical Association.  All rights reserved. The codes documented in this report are preliminary and upon coder review may  be revised to meet current compliance requirements. [name], MD 09/28/2017 11:00:43 AM This report has been signed  electronically. Number of Addenda: 0 Note Initiated On: 09/28/2017 10:35 AM      [facility]

## 2017-09-28 NOTE — H&P (Signed)
Outpatient short stay form Pre-procedure 09/28/2017 10:15 AM [name] MD  Primary Physician: Dr. Tomasa Hose  Reason for visit: EGD and colonoscopy  History of present illness: Patient is a 57 year old female presenting today as above.  About 10 months ago she came in for an EGD that showed a lot of food content in the stomach and it was not possible to evaluate the inside of the stomach.  She is now  presenting for further evaluation.  She had a gastric emptying study done on 08/09/2017 that indicated a very poor gastric emptying with only 31% at 4 hours.  Does complain of epigastric discomfort and pain.  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  She tolerated her prep well.  She takes no aspirin or blood thinning agent.  Also presenting for a screening colonoscopy.  Have a history of chronic constipation takes MiraLAX daily.    Current Facility-Administered Medications:  .  0.9 %  sodium chloride infusion, , Intravenous, Continuous, [name], MD  Medications Prior to Admission  Medication Sig Dispense Refill Last Dose  . labetalol (NORMODYNE) 200 MG tablet TK 3 TS PO BID  1 09/27/2017 at [id number] time  . lurasidone (LATUDA) 40 MG TABS tablet Take 40 mg by mouth daily with breakfast.     . atorvastatin (LIPITOR) 40 MG tablet Take 40 mg by mouth at bedtime.    Taking  . butalbital-acetaminophen-caffeine (FIORICET, ESGIC) 50-325-40 MG tablet Take 1-2 tablets by mouth every 4 (four) hours as needed for headache.   Taking  . conjugated estrogens (PREMARIN) vaginal cream Place 1 Applicatorful vaginally daily. Apply 0.5mg  (pea-sized amount)  just inside the vaginal introitus with a finger-tip on  Monday, Wednesday and Friday nights. 30 g 12 Taking  . cyclobenzaprine (FLEXERIL) 10 MG tablet Take 5-10 mg by mouth every 8 (eight) hours as needed for muscle spasms.   Taking  . donepezil (ARICEPT) 10 MG tablet Take 10 mg by mouth at bedtime.   Taking  . EPINEPHrine 0.3 mg/0.3 mL IJ SOAJ  injection Inject 0.3 mg into the muscle as needed. For anaphylactic reaction   Taking  . estradiol (ESTRACE VAGINAL) 0.1 MG/GM vaginal cream Apply 0.5mg  (pea-sized amount)  just inside the vaginal introitus with a finger-tip on Monday, Wednesday and Friday nights. 30 g 12 Taking  . gabapentin (NEURONTIN) 300 MG capsule Take 300 mg by mouth 3 (three) times daily.   Taking  . hydrocortisone 2.5 % cream Apply 1 application topically 2 (two) times daily.   Taking  . labetalol (NORMODYNE) 300 MG tablet Take 300 mg by mouth 2 (two) times daily.   Taking  . lamoTRIgine (LAMICTAL) 200 MG tablet Take 250 mg by mouth daily.    Taking  . levothyroxine (SYNTHROID, LEVOTHROID) 75 MCG tablet Take 1 tablet (75 mcg total) by mouth daily before breakfast. 30 tablet 8 Taking  . losartan (COZAAR) 50 MG tablet Take 1 tablet (50 mg total) by mouth daily. 30 tablet 0 Taking  . losartan-hydrochlorothiazide (HYZAAR) 100-25 MG per tablet Take 1 tablet by mouth daily. 90 tablet 3 Taking  . naproxen (NAPROSYN) 375 MG tablet naproxen 375 mg tablet  take 1 PO BID   Taking  . nitrofurantoin, macrocrystal-monohydrate, (MACROBID) 100 MG capsule Take 1 capsule (100 mg total) by mouth every 12 (twelve) hours. 14 capsule 0   . nortriptyline (PAMELOR) 10 MG capsule   1 Taking  . omeprazole (PRILOSEC) 20 MG capsule Take 20 mg by mouth daily.   Taking  .  ondansetron (ZOFRAN) 8 MG tablet Take by mouth every 8 (eight) hours as needed for nausea or vomiting.   Taking  . pantoprazole (PROTONIX) 40 MG tablet Take 40 mg by mouth 2 (two) times daily.   Taking  . phenytoin (DILANTIN) 100 MG ER capsule Take 200 mg by mouth 2 (two) times daily.   Taking  . promethazine (PHENERGAN) 12.5 MG tablet Take 1 tablet (12.5 mg total) by mouth every 8 (eight) hours as needed for nausea or vomiting. 12 tablet 0 Taking  . promethazine (PHENERGAN) 12.5 MG tablet Take 1 tablet (12.5 mg total) by mouth every 6 (six) hours as needed for nausea or vomiting. 12  tablet 0 Taking  . sucralfate (CARAFATE) 1 g tablet Take 1 g by mouth 4 (four) times daily.   Taking  . Verapamil HCl CR 300 MG CP24 Take 1 capsule (300 mg total) by mouth daily. 30 each 5 Taking     Allergies  Allergen Reactions  . Bee Venom Anaphylaxis  . Penicillins Shortness Of Breath and Rash  . Latex   . Lisinopril   . Other   . Sulfa Antibiotics      Past Medical History:  Diagnosis Date  . 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 Anxiety   . Bipolar 1 disorder ([place])   . Cancer ([place])    uterine  . Depression   . Dry mouth   . DVT of leg (deep venous thrombosis) ([facility])    history of  . Easy bruising   . Eye dryness   . Fatigue   . GERD (gastroesophageal reflux disease)   . Hyperlipidemia   . Hypertension   . Hypothyroid   . Migraine   . Migraine   . Seizure disorder ([place])   . Seizures ([place])    last year last seizure-had brain aneurysm. ON meds  . Stroke ([place]) 1997   No deficits  . Suicidal ideation    attempted overdoses in past    Review of systems:      Physical Exam    Heart and lungs: Regular rate and rhythm without rub or gallop, lungs are bilaterally clear    HEENT: Normocephalic atraumatic eyes are anicteric    Other:    Pertinant exam for procedure: Soft nontender nondistended bowel sounds positive normoactive    Planned proceedures: EGD and colonoscopy with indicated procedures I have discussed the risks benefits and complications of procedures to include not limited to bleeding, infection, perforation and the risk of sedation and the patient wishes to proceed.    [name], MD Gastroenterology 09/28/2017  10:15 AM

## 2017-09-28 NOTE — Transfer of Care (Signed)
Immediate Anesthesia Transfer of Care Note  Patient: Robin Jensen  Procedure(s) Performed: COLONOSCOPY WITH PROPOFOL (N/A ) ESOPHAGOGASTRODUODENOSCOPY (EGD) WITH PROPOFOL (N/A )  Patient Location: PACU  Anesthesia Type:MAC  Level of Consciousness: awake, alert  and oriented  Airway & Oxygen Therapy: Patient Spontanous Breathing and Patient connected to nasal cannula oxygen  Post-op Assessment: Report given to RN and Post -op Vital signs reviewed and stable  Post vital signs: stable  Last Vitals:  Vitals Value Taken Time  BP    Temp    Pulse 71 09/28/2017 11:29 AM  Resp 9 09/28/2017 11:29 AM  SpO2 100 % 09/28/2017 11:29 AM  Vitals shown include unvalidated device data.  Last Pain:  Vitals:   09/28/17 1013  TempSrc: Tympanic  PainSc: 0-No pain         Complications: No apparent anesthesia complications

## 2017-10-01 ENCOUNTER — Encounter: Payer: Self-pay | Admitting: Gastroenterology

## 2017-10-01 NOTE — Anesthesia Postprocedure Evaluation (Signed)
Anesthesia Post Note  Patient: Robin Jensen  Procedure(s) Performed: COLONOSCOPY WITH PROPOFOL (N/A ) ESOPHAGOGASTRODUODENOSCOPY (EGD) WITH PROPOFOL (N/A )  Patient location during evaluation: Endoscopy Anesthesia Type: General Level of consciousness: awake and alert Pain management: pain level controlled Vital Signs Assessment: post-procedure vital signs reviewed and stable Respiratory status: spontaneous breathing, nonlabored ventilation, respiratory function stable and patient connected to nasal cannula oxygen Cardiovascular status: blood pressure returned to baseline and stable Postop Assessment: no apparent nausea or vomiting Anesthetic complications: no     Last Vitals:  Vitals:   09/28/17 1140 09/28/17 1150  BP: (!) 150/98 (!) 170/109  Pulse: 65 66  Resp: 15 (!) 25  Temp:    SpO2: 99% 100%    Last Pain:  Vitals:   09/28/17 1120  TempSrc: Tympanic  PainSc:                  Martha Clan

## 2017-10-03 LAB — SURGICAL PATHOLOGY

## 2017-10-04 ENCOUNTER — Emergency Department: Payer: Medicare Other

## 2017-10-04 ENCOUNTER — Other Ambulatory Visit: Payer: Self-pay

## 2017-10-04 ENCOUNTER — Encounter: Payer: Self-pay | Admitting: Emergency Medicine

## 2017-10-04 ENCOUNTER — Observation Stay
Admission: EM | Admit: 2017-10-04 | Discharge: 2017-10-06 | Disposition: A | Discharge status: Home / Self Care | Payer: Medicare Other | Attending: Family Medicine | Admitting: Family Medicine

## 2017-10-04 DIAGNOSIS — Z880 Allergy status to penicillin: Secondary | ICD-10-CM | POA: Diagnosis not present

## 2017-10-04 DIAGNOSIS — F319 Bipolar disorder, unspecified: Secondary | ICD-10-CM | POA: Diagnosis not present

## 2017-10-04 DIAGNOSIS — Z888 Allergy status to other drugs, medicaments and biological substances status: Secondary | ICD-10-CM | POA: Insufficient documentation

## 2017-10-04 DIAGNOSIS — Z8673 Personal history of transient ischemic attack (TIA), and cerebral infarction without residual deficits: Secondary | ICD-10-CM | POA: Insufficient documentation

## 2017-10-04 DIAGNOSIS — F17210 Nicotine dependence, cigarettes, uncomplicated: Secondary | ICD-10-CM | POA: Insufficient documentation

## 2017-10-04 DIAGNOSIS — R262 Difficulty in walking, not elsewhere classified: Secondary | ICD-10-CM | POA: Diagnosis present

## 2017-10-04 DIAGNOSIS — E039 Hypothyroidism, unspecified: Secondary | ICD-10-CM | POA: Insufficient documentation

## 2017-10-04 DIAGNOSIS — R6881 Early satiety: Secondary | ICD-10-CM | POA: Insufficient documentation

## 2017-10-04 DIAGNOSIS — Z86718 Personal history of other venous thrombosis and embolism: Secondary | ICD-10-CM | POA: Diagnosis not present

## 2017-10-04 DIAGNOSIS — E785 Hyperlipidemia, unspecified: Secondary | ICD-10-CM | POA: Insufficient documentation

## 2017-10-04 DIAGNOSIS — M62838 Other muscle spasm: Secondary | ICD-10-CM | POA: Diagnosis not present

## 2017-10-04 DIAGNOSIS — K219 Gastro-esophageal reflux disease without esophagitis: Secondary | ICD-10-CM | POA: Insufficient documentation

## 2017-10-04 DIAGNOSIS — I10 Essential (primary) hypertension: Secondary | ICD-10-CM | POA: Insufficient documentation

## 2017-10-04 DIAGNOSIS — G40909 Epilepsy, unspecified, not intractable, without status epilepticus: Secondary | ICD-10-CM | POA: Diagnosis not present

## 2017-10-04 DIAGNOSIS — R270 Ataxia, unspecified: Secondary | ICD-10-CM | POA: Diagnosis present

## 2017-10-04 DIAGNOSIS — B9681 Helicobacter pylori [H. pylori] as the cause of diseases classified elsewhere: Secondary | ICD-10-CM | POA: Diagnosis not present

## 2017-10-04 DIAGNOSIS — K2960 Other gastritis without bleeding: Secondary | ICD-10-CM | POA: Insufficient documentation

## 2017-10-04 DIAGNOSIS — Z882 Allergy status to sulfonamides status: Secondary | ICD-10-CM | POA: Diagnosis not present

## 2017-10-04 DIAGNOSIS — I951 Orthostatic hypotension: Secondary | ICD-10-CM | POA: Diagnosis not present

## 2017-10-04 LAB — TROPONIN I

## 2017-10-04 LAB — COMPREHENSIVE METABOLIC PANEL
ALT: 20 U/L (ref 14–54)
AST: 33 U/L (ref 15–41)
Albumin: 3.8 g/dL (ref 3.5–5.0)
Alkaline Phosphatase: 71 U/L (ref 38–126)
Anion gap: 11 (ref 5–15)
BILIRUBIN TOTAL: 0.3 mg/dL (ref 0.3–1.2)
BUN: 17 mg/dL (ref 6–20)
CHLORIDE: 99 mmol/L — AB (ref 101–111)
CO2: 26 mmol/L (ref 22–32)
CREATININE: 1.25 mg/dL — AB (ref 0.44–1.00)
Calcium: 9 mg/dL (ref 8.9–10.3)
GFR calc Af Amer: 54 mL/min — ABNORMAL LOW (ref >60)
GFR, EST NON AFRICAN AMERICAN: 47 mL/min — AB (ref >60)
Glucose, Bld: 102 mg/dL — ABNORMAL HIGH (ref 65–99)
Potassium: 3.8 mmol/L (ref 3.5–5.1)
Sodium: 136 mmol/L (ref 135–145)
Total Protein: 6.7 g/dL (ref 6.5–8.1)

## 2017-10-04 LAB — CBC
HEMATOCRIT: 34.9 % — AB (ref 35.0–47.0)
HEMOGLOBIN: 12.2 g/dL (ref 12.0–16.0)
MCH: 29.3 pg (ref 26.0–34.0)
MCHC: 34.8 g/dL (ref 32.0–36.0)
MCV: 84.1 fL (ref 80.0–100.0)
Platelets: 208 10*3/uL (ref 150–440)
RBC: 4.16 MIL/uL (ref 3.80–5.20)
RDW: 14.7 % — ABNORMAL HIGH (ref 11.5–14.5)
WBC: 6.7 10*3/uL (ref 3.6–11.0)

## 2017-10-04 LAB — APTT: aPTT: 27 seconds (ref 24–36)

## 2017-10-04 LAB — DIFFERENTIAL
BASOS ABS: 0.1 10*3/uL (ref 0–0.1)
BASOS PCT: 1 %
Eosinophils Absolute: 0.1 10*3/uL (ref 0–0.7)
Eosinophils Relative: 1 %
LYMPHS ABS: 2.3 10*3/uL (ref 1.0–3.6)
LYMPHS PCT: 34 %
MONOS PCT: 6 %
Monocytes Absolute: 0.4 10*3/uL (ref 0.2–0.9)
Neutro Abs: 3.8 10*3/uL (ref 1.4–6.5)
Neutrophils Relative %: 58 %

## 2017-10-04 LAB — PROTIME-INR
INR: 0.95
Prothrombin Time: 12.6 seconds (ref 11.4–15.2)

## 2017-10-04 LAB — POCT PREGNANCY, URINE: PREG TEST UR: NEGATIVE

## 2017-10-04 MED ORDER — IOPAMIDOL (ISOVUE-370) INJECTION 76%
75.0000 mL | Freq: Once | INTRAVENOUS | Status: AC | PRN
  Administered 2017-10-04: 75 mL via INTRAVENOUS

## 2017-10-04 MED ORDER — ACETAMINOPHEN 10 MG/ML IV SOLN
1000.0000 mg | Freq: Once | INTRAVENOUS | Status: AC
  Administered 2017-10-04: 1000 mg via INTRAVENOUS
  Filled 2017-10-04: qty 100

## 2017-10-04 MED ORDER — LORAZEPAM 2 MG/ML IJ SOLN
INTRAMUSCULAR | Status: AC
  Filled 2017-10-04: qty 1

## 2017-10-04 MED ORDER — SODIUM CHLORIDE 0.9 % IV SOLN
Freq: Once | INTRAVENOUS | Status: AC
  Administered 2017-10-04: 20:00:00 via INTRAVENOUS

## 2017-10-04 MED ORDER — LORAZEPAM 2 MG/ML IJ SOLN
1.0000 mg | Freq: Once | INTRAMUSCULAR | Status: AC
  Administered 2017-10-04: 1 mg via INTRAVENOUS

## 2017-10-04 NOTE — ED Notes (Signed)
Tried to call report but there is no bed in the room.

## 2017-10-04 NOTE — ED Notes (Signed)
IV tylenol had stopped flowing and NS was backing into vial. Amount restarted and is now flowing correctly.

## 2017-10-04 NOTE — ED Triage Notes (Addendum)
PT to ED via POV , c/o increased falls due to loss of balance and " stuttering speech". Pt appears fatigue and slow to respond to questions. Pt A&OX4, VSS .

## 2017-10-04 NOTE — ED Notes (Addendum)
Informed RN that patient has been roomed and is ready for evaluation.  Patient in NAD at this time and call bell placed within reach. RN informed that patient needs recollect on blue top.

## 2017-10-04 NOTE — ED Notes (Signed)
Teleneuro cart from ICU placed into pt room for consult.

## 2017-10-04 NOTE — ED Notes (Signed)
Pt given ginger ale and a urinal per request.

## 2017-10-04 NOTE — ED Notes (Signed)
Pt changed into a gown and fluids held off pump for pt to go to MRI.

## 2017-10-04 NOTE — ED Provider Notes (Signed)
[facility] Emergency Department Provider Note   ____________________________________________   First MD Initiated Contact with Patient 10/04/17 1741     (approximate)  I have reviewed the triage vital signs and the nursing notes.   HISTORY  Chief Complaint No chief complaint on file. Chief complaint is dizziness   HPI Robin Jensen is a 57 y.o. female reports onset of dizziness and slurred speech yesterday.  She says she had this before and was told it was a UTI.  She can give me no other complaints except for that she had pain in her head and neck yesterday.  Pain was fairly severe.  It is still hurting now.  Swallowing study was performed by the nurse.  Nurse reports the patient is having a lot of difficulty swallowing therefore we will keep her n.p.o.  I will start her on small amount of IV saline to keep her hydrated  Past Medical History:  Diagnosis Date  . 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 Anxiety   . Bipolar 1 disorder ([place])   . Cancer ([place])    uterine  . Depression   . Dry mouth   . DVT of leg (deep venous thrombosis) ([facility])    history of  . Easy bruising   . Eye dryness   . Fatigue   . GERD (gastroesophageal reflux disease)   . Hyperlipidemia   . Hypertension   . Hypothyroid   . Migraine   . Migraine   . Seizure disorder ([place])   . Seizures ([employer])    last year last seizure-had brain aneurysm. ON meds  . Stroke ([place]) 1997   No deficits  . Suicidal ideation    attempted overdoses in past    Patient Active Problem List   Diagnosis Date Noted  . Aneurysm, cerebral, nonruptured 08/14/2012  . Unspecified hypothyroidism 08/14/2012  . Other and unspecified hyperlipidemia 08/14/2012  . Seizures ([place]) 08/14/2012  . Insomnia 08/14/2012  . Unspecified constipation 08/07/2012  . FHx: colon cancer 08/07/2012  . Right sided abdominal pain 08/07/2012  . Essential hypertension, benign 08/07/2012  . Abnormal LFTs 08/07/2012     Past Surgical History:  Procedure Laterality Date  . ABDOMINAL HYSTERECTOMY    . CEREBRAL ANEURYSM REPAIR  1997  . CESAREAN SECTION    . CHOLECYSTECTOMY    . COLONOSCOPY WITH PROPOFOL N/A 08/20/2012   Procedure: COLONOSCOPY WITH PROPOFOL;  Surgeon: [name], MD;  Location: [facility];  Service: Endoscopy;  Laterality: N/A;  In cecum at [place]; Total Withdrawal Time= 12 minutes  . COLONOSCOPY WITH PROPOFOL N/A 09/28/2017   Procedure: COLONOSCOPY WITH PROPOFOL;  Surgeon: [name], MD;  Location: [facility] ENDOSCOPY;  Service: Endoscopy;  Laterality: N/A;  . ESOPHAGOGASTRODUODENOSCOPY (EGD) WITH PROPOFOL N/A 11/20/2016   Procedure: ESOPHAGOGASTRODUODENOSCOPY (EGD) WITH PROPOFOL;  Surgeon: [name], MD;  Location: [facility] ENDOSCOPY;  Service: Endoscopy;  Laterality: N/A;  . ESOPHAGOGASTRODUODENOSCOPY (EGD) WITH PROPOFOL N/A 09/28/2017   Procedure: ESOPHAGOGASTRODUODENOSCOPY (EGD) WITH PROPOFOL;  Surgeon: [name], MD;  Location: [facility] ENDOSCOPY;  Service: Endoscopy;  Laterality: N/A;  . RADICAL HYSTERECTOMY      Prior to Admission medications   Medication Sig Start Date End Date Taking? Authorizing Provider  atorvastatin (LIPITOR) 40 MG tablet Take 40 mg by mouth at bedtime.    Yes [provider]  cyclobenzaprine (FLEXERIL) 10 MG tablet Take 10 mg by mouth every 8 (eight) hours as needed for muscle spasms.  Yes [provider]  diazepam (VALIUM) 5 MG tablet Take 5 mg by mouth at bedtime. 09/20/17  Yes [provider]  donepezil (ARICEPT) 5 MG tablet Take 5 mg by mouth at bedtime.    Yes [provider]  EPINEPHrine 0.3 mg/0.3 mL IJ SOAJ injection Inject 0.3 mg into the muscle as needed. For anaphylactic reaction   Yes [provider]  estradiol (ESTRACE VAGINAL) 0.1 MG/GM vaginal cream Apply 0.5mg  (pea-sized amount)  just inside the vaginal introitus with a finger-tip on Monday, Wednesday and Friday nights. 05/09/17  Yes McGowan,  Larene Beach A, PA-C  gabapentin (NEURONTIN) 300 MG capsule Take 300 mg by mouth 3 (three) times daily.   Yes [provider]  labetalol (NORMODYNE) 200 MG tablet Take 600 mg by mouth 2 (two) times daily.   Yes [provider]  lamoTRIgine (LAMICTAL) 200 MG tablet Take 300 mg by mouth daily.    Yes [provider]  levothyroxine (SYNTHROID, LEVOTHROID) 75 MCG tablet Take 1 tablet (75 mcg total) by mouth daily before breakfast. 10/03/12  Yes Oxford, Orson Ape, FNP  losartan (COZAAR) 50 MG tablet Take 1 tablet (50 mg total) by mouth daily. 01/02/14  Yes Minus Breeding, MD  losartan-hydrochlorothiazide (HYZAAR) 100-25 MG per tablet Take 1 tablet by mouth daily. 10/03/12  Yes Lysbeth Penner, FNP  lurasidone (LATUDA) 80 MG TABS tablet Take 80 mg by mouth at bedtime.    Yes [provider]  nortriptyline (PAMELOR) 10 MG capsule Take 20 mg by mouth at bedtime.  06/12/17  Yes [provider]  pantoprazole (PROTONIX) 40 MG tablet Take 40 mg by mouth 2 (two) times daily.   Yes [provider]  phenytoin (DILANTIN) 200 MG ER capsule Take 400 mg by mouth daily.    Yes [provider]  potassium chloride (K-DUR) 10 MEQ tablet Take 10 mEq by mouth daily. 09/22/17  Yes [provider]  tiZANidine (ZANAFLEX) 2 MG tablet Take 2 mg by mouth 2 (two) times daily. 09/26/17  Yes [provider]  Verapamil HCl CR 300 MG CP24 Take 1 capsule (300 mg total) by mouth daily. Patient taking differently: Take 300 mg by mouth every evening.  10/03/12  Yes Oxford, Orson Ape, FNP  nitrofurantoin, macrocrystal-monohydrate, (MACROBID) 100 MG capsule Take 1 capsule (100 mg total) by mouth every 12 (twelve) hours. Patient not taking: Reported on 10/04/2017 07/02/17   Zara Council A, PA-C    Allergies Bee venom; Penicillins; Latex; Lisinopril; Other; and Sulfa antibiotics  Family History  Problem Relation Age of Onset  . Colon cancer Father        less than  age 61  . Colon cancer Paternal Uncle        25s  . Colon cancer Paternal Grandfather   . Breast cancer Maternal Aunt   . Diabetes Sister   . Arthritis Brother   . Hematuria Mother   . Kidney cancer Maternal Grandmother   . Kidney disease Maternal Grandmother   . Prostate cancer Neg Hx     Social History Social History   Tobacco Use  . Smoking status: Light Tobacco Smoker    Last attempt to quit: 08/15/2005    Years since quitting: 12.1  . Smokeless tobacco: Never Used  Substance Use Topics  . 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 Drug use: No    Comment: H/O marijuana and cocaine, in 1980s    Review of Systems  Constitutional: No  fever/chills Eyes: No visual changes. ENT: No sore throat. Cardiovascular: Denies chest pain. Respiratory: Denies shortness of breath. Gastrointestinal: No abdominal pain.  No nausea, no vomiting.  No diarrhea.  No constipation. Genitourinary: Negative for dysuria. Musculoskeletal: Negative for back pain. Skin: Negative for rash. Neurological: See HPI  ____________________________________________   PHYSICAL EXAM:  VITAL SIGNS: ED Triage Vitals  Enc Vitals Group     BP 10/04/17 1629 130/72     Pulse Rate 10/04/17 1629 64     Resp 10/04/17 1629 (!) 21     Temp 10/04/17 1629 98.4 F (36.9 C)     Temp Source 10/04/17 1629 Oral     SpO2 10/04/17 1629 98 %     Weight 10/04/17 1629 146 lb (66.2 kg)     Height 10/04/17 1629 5\' 1"  (1.549 m)     Head Circumference --      Peak Flow --      Pain Score 10/04/17 1632 9     Pain Loc --      Pain Edu? --      Excl. in [place]? --     Constitutional: Alert and oriented. Well appearing and in no acute distress. Eyes: Conjunctivae are normal. PER. EOMI. Head: Atraumatic. Nose: No congestion/rhinnorhea. Mouth/Throat: Mucous membranes are moist.  Oropharynx non-erythematous. Neck: No stridor.   Cardiovascular: Normal rate, regular rhythm. Grossly normal heart sounds.  Good peripheral  circulation. Respiratory: Normal respiratory effort.  No retractions. Lungs CTAB. Gastrointestinal: Soft and nontender. No distention. No abdominal bruits. No CVA tenderness. Musculoskeletal: No lower extremity tenderness nor edema.   Neurologic: Patient is having some word finding difficulty and her words are occasionally slightly slurry.  She has no obvious weakness or numbness but her left arm and leg are ataxic rapid alternating movements and finger-to-nose in using her toe to touch my finger are all very difficult for her to perform on the left side.  Gait instability. Skin:  Skin is warm, dry and intact. No rash noted. Psychiatric: Mood and affect are normal. Speech and behavior are normal.  ____________________________________________   LABS (all labs ordered are listed, but only abnormal results are displayed)  Labs Reviewed  CBC - Abnormal; Notable for the following components:      Result Value   HCT 34.9 (*)    RDW 14.7 (*)    All other components within normal limits  COMPREHENSIVE METABOLIC PANEL - Abnormal; Notable for the following components:   Chloride 99 (*)    Glucose, Bld 102 (*)    Creatinine, Ser 1.25 (*)    GFR calc non Af Amer 47 (*)    GFR calc Af Amer 54 (*)    All other components within normal limits  DIFFERENTIAL  TROPONIN I  PROTIME-INR  APTT  CBG MONITORING, ED  POC URINE PREG, ED  POCT PREGNANCY, URINE   ____________________________________________  EKG  EKG read and interpreted by me shows normal sinus rhythm rate of 63 left axis no acute ST-T wave changes ____________________________________________  RADIOLOGY  ED MD interpretation: CT read by radiology shows no acute changes  Official radiology report(s): Ct Angio Head W Or Wo Contrast  Result Date: 10/04/2017 CLINICAL DATA:  Initial evaluation for increased falls, stuttering speech. EXAM: CT ANGIOGRAPHY HEAD AND NECK TECHNIQUE: Multidetector CT imaging of the head and neck was  performed using the standard protocol during bolus administration of intravenous contrast. Multiplanar CT image reconstructions and MIPs were obtained to evaluate the vascular anatomy. Carotid stenosis measurements (  when applicable) are obtained utilizing NASCET criteria, using the distal internal carotid diameter as the denominator. CONTRAST:  33mL ISOVUE-370 IOPAMIDOL (ISOVUE-370) INJECTION 76% COMPARISON:  Prior CTA from 08/08/2017. FINDINGS: CTA NECK FINDINGS Aortic arch: Visualized aortic arch of normal caliber with normal branch pattern. Minimal plaque within the arch itself without significant stenosis about the origin of the great vessels. Visualized subclavian arteries widely patent. Right carotid system: Right common carotid artery widely patent from its origin to the bifurcation. No significant atheromatous narrowing about the right carotid bifurcation. Right ICA patent from the bifurcation to the skull base without stenosis, dissection, or occlusion. Left carotid system: Left common carotid artery patent from its origin to the bifurcation without stenosis. Mild mixed plaque about the left bifurcation without significant stenosis. Left ICA widely patent distally without stenosis, dissection, or occlusion. Vertebral arteries: Left vertebral artery arises separately from the aortic arch. Right vertebral artery arises from the right subclavian artery. Vertebral arteries are largely code dominant. Vertebral arteries widely patent within the neck without stenosis, dissection, or occlusion. Skeleton: No acute osseous abnormality. No worrisome lytic or blastic osseous lesions. Mild cervical spondylolysis at C6-7. Other neck: No acute soft tissue abnormality within the neck. Thyroid normal. Salivary glands normal. No significant adenopathy. Upper chest: Visualized upper chest within normal limits. Visualized lungs are clear. Review of the MIP images confirms the above findings CTA HEAD FINDINGS Anterior  circulation: Petrous segments widely patent bilaterally. Calcified plaque at the anterior genu of the cavernous right ICA without significant stenosis. Scattered atheromatous plaque about the cavernous and supraclinoid left ICA without high-grade stenosis, stable from previous. 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 No residual aneurysm identified. ICA termini widely patent bilaterally. A1 segments and anterior communicating artery widely patent bilaterally. Mild to moderate atheromatous irregularity within the ACA is bilaterally, greater on the left, which do remain patent to their distal aspects. M1 segments patent bilaterally without high-grade stenosis. Mild atheromatous irregularity about the left MCA bifurcation without high-grade stenosis. No proximal M2 occlusion distal MCA branches well perfused and symmetric with mild atheromatous irregularity. Posterior circulation: Vertebral arteries widely patent to the vertebrobasilar junction without stenosis. Patent right PICA. Dominant left AICA. Basilar artery widely patent to its distal aspect without stenosis. Superior cerebral arteries patent bilaterally. Posterior cerebral arteries primarily supplied via the basilar. PCAs demonstrate mild atheromatous irregularity but are widely patent to their distal aspects without hemodynamically significant stenosis. Small/hypoplastic bilateral posterior communicating arteries noted. Venous sinuses: Patent. Anatomic variants: None. Delayed phase: No abnormal enhancement. Review of the MIP images confirms the above findings IMPRESSION: CTA OF THE NECK: 1. Negative CTA of the neck with no large vessel occlusion or hemodynamically significant stenosis. 2. Minor atheromatous change about the aortic arch and left carotid bifurcation without stenosis. CTA OF THE HEAD 1. Prior supraclinoid right ICA aneurysm clipping without complicating features. No residual aneurysm. 2. Mild intracranial atherosclerotic  change involving the anterior and posterior circulation, most notable at the distal left ACA. No high-grade proximal or correctable stenosis identified. Overall, appearance is stable relative to recent CTA from 08/08/2017. 3. No other acute intracranial abnormality. Electronically Signed   By: Jeannine Boga M.D.   On: 10/04/2017 19:48   Ct Head Wo Contrast  Result Date: 10/04/2017 CLINICAL DATA:  Initial evaluation for increase falls with stuttering speech, blurry vision. EXAM: CT HEAD WITHOUT CONTRAST TECHNIQUE: Contiguous axial images were obtained from the base of the skull through the vertex without intravenous contrast. COMPARISON:  Prior CT from  08/08/2017. FINDINGS: Brain: Mild age-related cerebral atrophy with chronic small vessel ischemic disease. No acute intracranial hemorrhage. No acute large vessel territory infarct. No mass lesion, midline shift or mass effect. No hydrocephalus. No extra-axial fluid collection. Vascular: Sequelae of prior surgical clipping of supraclinoid right ICA aneurysm. No hyperdense vessel. Skull: Prior right pterional craniotomy for surgical clipping of aneurysm. Mild plagiocephaly. No acute skull fracture. Scalp soft tissues demonstrate no acute abnormality. Sinuses/Orbits: Globes and orbital soft tissues within normal limits. No significant paranasal sinus disease. Mastoid air cells are clear. Other: None. IMPRESSION: 1. No acute intracranial abnormality. 2. 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 Electronically Signed   By: Jeannine Boga M.D.   On: 10/04/2017 17:02   Ct Angio Neck W And/or Wo Contrast  Result Date: 10/04/2017 CLINICAL DATA:  Initial evaluation for increased falls, stuttering speech. EXAM: CT ANGIOGRAPHY HEAD AND NECK TECHNIQUE: Multidetector CT imaging of the head and neck was performed using the standard protocol during bolus administration of intravenous contrast. Multiplanar CT image reconstructions  and MIPs were obtained to evaluate the vascular anatomy. Carotid stenosis measurements (when applicable) are obtained utilizing NASCET criteria, using the distal internal carotid diameter as the denominator. CONTRAST:  74mL ISOVUE-370 IOPAMIDOL (ISOVUE-370) INJECTION 76% COMPARISON:  Prior CTA from 08/08/2017. FINDINGS: CTA NECK FINDINGS Aortic arch: Visualized aortic arch of normal caliber with normal branch pattern. Minimal plaque within the arch itself without significant stenosis about the origin of the great vessels. Visualized subclavian arteries widely patent. Right carotid system: Right common carotid artery widely patent from its origin to the bifurcation. No significant atheromatous narrowing about the right carotid bifurcation. Right ICA patent from the bifurcation to the skull base without stenosis, dissection, or occlusion. Left carotid system: Left common carotid artery patent from its origin to the bifurcation without stenosis. Mild mixed plaque about the left bifurcation without significant stenosis. Left ICA widely patent distally without stenosis, dissection, or occlusion. Vertebral arteries: Left vertebral artery arises separately from the aortic arch. Right vertebral artery arises from the right subclavian artery. Vertebral arteries are largely code dominant. Vertebral arteries widely patent within the neck without stenosis, dissection, or occlusion. Skeleton: No acute osseous abnormality. No worrisome lytic or blastic osseous lesions. Mild cervical spondylolysis at C6-7. Other neck: No acute soft tissue abnormality within the neck. Thyroid normal. Salivary glands normal. No significant adenopathy. Upper chest: Visualized upper chest within normal limits. Visualized lungs are clear. Review of the MIP images confirms the above findings CTA HEAD FINDINGS Anterior circulation: Petrous segments widely patent bilaterally. Calcified plaque at the anterior genu of the cavernous right ICA without  significant stenosis. Scattered atheromatous plaque about the cavernous and supraclinoid left ICA without high-grade stenosis, stable from previous. 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 No residual aneurysm identified. ICA termini widely patent bilaterally. A1 segments and anterior communicating artery widely patent bilaterally. Mild to moderate atheromatous irregularity within the ACA is bilaterally, greater on the left, which do remain patent to their distal aspects. M1 segments patent bilaterally without high-grade stenosis. Mild atheromatous irregularity about the left MCA bifurcation without high-grade stenosis. No proximal M2 occlusion distal MCA branches well perfused and symmetric with mild atheromatous irregularity. Posterior circulation: Vertebral arteries widely patent to the vertebrobasilar junction without stenosis. Patent right PICA. Dominant left AICA. Basilar artery widely patent to its distal aspect without stenosis. Superior cerebral arteries patent bilaterally. Posterior cerebral arteries primarily supplied via the basilar. PCAs demonstrate mild atheromatous irregularity but are widely  patent to their distal aspects without hemodynamically significant stenosis. Small/hypoplastic bilateral posterior communicating arteries noted. Venous sinuses: Patent. Anatomic variants: None. Delayed phase: No abnormal enhancement. Review of the MIP images confirms the above findings IMPRESSION: CTA OF THE NECK: 1. Negative CTA of the neck with no large vessel occlusion or hemodynamically significant stenosis. 2. Minor atheromatous change about the aortic arch and left carotid bifurcation without stenosis. CTA OF THE HEAD 1. Prior supraclinoid right ICA aneurysm clipping without complicating features. No residual aneurysm. 2. Mild intracranial atherosclerotic change involving the anterior and posterior circulation, most notable at the distal left ACA. No high-grade proximal or correctable  stenosis identified. Overall, appearance is stable relative to recent CTA from 08/08/2017. 3. No other acute intracranial abnormality. Electronically Signed   By: Jeannine Boga M.D.   On: 10/04/2017 19:48   Mr Brain Wo Contrast  Result Date: 10/04/2017 CLINICAL DATA:  Increasing gait imbalance of falls, stuttering speech, fatigue. History of stroke, brain aneurysm (s/p supraclinoid aneurysm clipping), hypertension, hyperlipidemia, seizures. EXAM: MRI HEAD WITHOUT CONTRAST TECHNIQUE: Multiplanar, multiecho pulse sequences of the brain and surrounding structures were obtained without intravenous contrast. COMPARISON:  CT head October 04, 2017 and MRI of the head July 21, 2010 FINDINGS: Mild motion degraded examination. INTRACRANIAL CONTENTS: No reduced diffusion to suggest acute ischemia. No susceptibility artifact to suggest hemorrhage. Borderline parenchymal brain volume loss for age. Patchy supratentorial white matter FLAIR T2 hyperintensities are unchanged no suspicious parenchymal signal, masses, mass effect. No abnormal extra-axial fluid collections. No extra-axial masses. VASCULAR: Susceptibility artifact associated with RIGHT ICA aneurysm clip and RIGHT craniotomy. SKULL AND UPPER CERVICAL SPINE: No abnormal sellar expansion. No suspicious calvarial bone marrow signal. S post RIGHT craniotomy. Craniocervical junction maintained. SINUSES/ORBITS: The mastoid air-cells and included paranasal sinuses are well-aerated.The included ocular globes and orbital contents are non-suspicious. OTHER: None. IMPRESSION: 1. No acute intracranial process and mildly motion degraded examination. 2. Status post ICA aneurysm clipping. 3. Stable borderline parenchymal brain volume loss for age and mild chronic small vessel ischemic changes. Electronically Signed   By: Elon Alas M.D.   On: 10/04/2017 21:55    ____________________________________________   PROCEDURES  Procedure(s) performed:    Procedures  Critical Care performed:   ____________________________________________   INITIAL IMPRESSION / ASSESSMENT AND PLAN / ED COURSE Tele-neurology says she is not in any window for treatment of stroke or any large vessel occlusion.  I cannot do a code stroke on her.  I could get a consult but I will wait at least until I get the MRI back.  Patient unable to walk speech is not normal she is ataxic falling backwards MRI CT angiogram head and neck and CT are all negative however.  Still I cannot see discharging her when she cannot walk.  We will admit her have neurology see her in the morning   Clinical Course as of Oct 05 2206  Thu Oct 04, 2017  1744 Eosinophils Absolute: 0.1 [PM]  2207 Differential [PM]    Clinical Course User Index [PM] Nena Polio, MD     ____________________________________________   FINAL CLINICAL IMPRESSION(S) / ED DIAGNOSES  Final diagnoses:  Ataxia  Unable to walk     ED Discharge Orders    None       Note:  This document was prepared using Dragon voice recognition software and may include unintentional dictation errors.    Nena Polio, MD 10/04/17 2209

## 2017-10-04 NOTE — ED Notes (Signed)
Neuro RN called and stated that they do not round at [facility]. Will notify MD.

## 2017-10-04 NOTE — ED Notes (Addendum)
Pt asking for something for pain.  °

## 2017-10-04 NOTE — ED Notes (Signed)
Pt was up to toilet to collect urine sample. Pt unsteady on feet and started leaning backward to the left side, RN had to correct pt to prevent falling. Pt has jerky movements when attempting to complete NIH scale, as well as with other movements. During stroke swallow screen pt had difficulties swallowing water from cup and started coughing after drinking water. Pt started sticking tongue out of mouth in a repetitive motion after drinking from cup and pt states she feels like she is unable to swallow at this time.

## 2017-10-05 ENCOUNTER — Other Ambulatory Visit: Payer: Self-pay

## 2017-10-05 DIAGNOSIS — K2970 Gastritis, unspecified, without bleeding: Secondary | ICD-10-CM

## 2017-10-05 DIAGNOSIS — B9681 Helicobacter pylori [H. pylori] as the cause of diseases classified elsewhere: Secondary | ICD-10-CM

## 2017-10-05 DIAGNOSIS — K30 Functional dyspepsia: Secondary | ICD-10-CM

## 2017-10-05 DIAGNOSIS — R270 Ataxia, unspecified: Secondary | ICD-10-CM | POA: Diagnosis not present

## 2017-10-05 LAB — URINALYSIS, COMPLETE (UACMP) WITH MICROSCOPIC
BILIRUBIN URINE: NEGATIVE
GLUCOSE, UA: NEGATIVE mg/dL
KETONES UR: NEGATIVE mg/dL
Leukocytes, UA: NEGATIVE
NITRITE: NEGATIVE
PROTEIN: NEGATIVE mg/dL
Specific Gravity, Urine: 1.038 — ABNORMAL HIGH (ref 1.005–1.030)
pH: 6 (ref 5.0–8.0)

## 2017-10-05 LAB — IRON AND TIBC
IRON: 100 ug/dL (ref 28–170)
Saturation Ratios: 34 % — ABNORMAL HIGH (ref 10.4–31.8)
TIBC: 298 ug/dL (ref 250–450)
UIBC: 198 ug/dL

## 2017-10-05 LAB — HEMOGLOBIN A1C
Hgb A1c MFr Bld: 5.2 % (ref 4.8–5.6)
Mean Plasma Glucose: 102.54 mg/dL

## 2017-10-05 LAB — FERRITIN: Ferritin: 53 ng/mL (ref 11–307)

## 2017-10-05 LAB — FOLATE: Folate: 7.2 ng/mL (ref >5.9)

## 2017-10-05 LAB — TSH: TSH: 1.38 u[IU]/mL (ref 0.350–4.500)

## 2017-10-05 LAB — TROPONIN I

## 2017-10-05 MED ORDER — ONDANSETRON HCL 4 MG/2ML IJ SOLN: 4.0000 mg | Freq: Four times a day (QID) | INTRAMUSCULAR | Status: DC | PRN

## 2017-10-05 MED ORDER — LOSARTAN POTASSIUM-HCTZ 100-25 MG PO TABS: 1.0000 | ORAL_TABLET | Freq: Every day | ORAL | Status: DC

## 2017-10-05 MED ORDER — CLARITHROMYCIN 500 MG PO TABS
500.0000 mg | ORAL_TABLET | Freq: Two times a day (BID) | ORAL | Status: DC
  Administered 2017-10-05 – 2017-10-06 (×2): 500 mg via ORAL
  Filled 2017-10-05 (×3): qty 1

## 2017-10-05 MED ORDER — HYDRALAZINE HCL 20 MG/ML IJ SOLN
10.0000 mg | INTRAMUSCULAR | Status: DC | PRN
  Administered 2017-10-05: 16:00:00 10 mg via INTRAVENOUS
  Filled 2017-10-05: qty 1

## 2017-10-05 MED ORDER — LEVOTHYROXINE SODIUM 50 MCG PO TABS
75.0000 ug | ORAL_TABLET | Freq: Every day | ORAL | Status: DC
  Administered 2017-10-06: 09:00:00 75 ug via ORAL
  Filled 2017-10-05: qty 2

## 2017-10-05 MED ORDER — DIAZEPAM 5 MG PO TABS
5.0000 mg | ORAL_TABLET | Freq: Two times a day (BID) | ORAL | Status: DC | PRN
  Administered 2017-10-05: 5 mg via ORAL
  Filled 2017-10-05: qty 1

## 2017-10-05 MED ORDER — ADULT MULTIVITAMIN W/MINERALS CH
1.0000 | ORAL_TABLET | Freq: Every day | ORAL | Status: DC
  Administered 2017-10-05 – 2017-10-06 (×2): 1 via ORAL
  Filled 2017-10-05 (×2): qty 1

## 2017-10-05 MED ORDER — LABETALOL HCL 200 MG PO TABS
300.0000 mg | ORAL_TABLET | Freq: Two times a day (BID) | ORAL | Status: DC
  Administered 2017-10-05 – 2017-10-06 (×2): 300 mg via ORAL
  Filled 2017-10-05 (×3): qty 1

## 2017-10-05 MED ORDER — HEPARIN SODIUM (PORCINE) 5000 UNIT/ML IJ SOLN
5000.0000 [IU] | Freq: Three times a day (TID) | INTRAMUSCULAR | Status: DC
  Administered 2017-10-05 (×3): 5000 [IU] via SUBCUTANEOUS
  Filled 2017-10-05 (×3): qty 1

## 2017-10-05 MED ORDER — METRONIDAZOLE 500 MG PO TABS
500.0000 mg | ORAL_TABLET | Freq: Two times a day (BID) | ORAL | Status: DC
  Administered 2017-10-05 – 2017-10-06 (×2): 500 mg via ORAL
  Filled 2017-10-05 (×2): qty 1

## 2017-10-05 MED ORDER — HYDROCHLOROTHIAZIDE 25 MG PO TABS
25.0000 mg | ORAL_TABLET | Freq: Every day | ORAL | Status: DC
  Administered 2017-10-05 – 2017-10-06 (×2): 25 mg via ORAL
  Filled 2017-10-05 (×2): qty 1

## 2017-10-05 MED ORDER — GABAPENTIN 300 MG PO CAPS
300.0000 mg | ORAL_CAPSULE | Freq: Three times a day (TID) | ORAL | Status: DC
  Administered 2017-10-05 – 2017-10-06 (×3): 300 mg via ORAL
  Filled 2017-10-05 (×3): qty 1

## 2017-10-05 MED ORDER — ACETAMINOPHEN 650 MG RE SUPP: 650.0000 mg | Freq: Four times a day (QID) | RECTAL | Status: DC | PRN

## 2017-10-05 MED ORDER — ONDANSETRON HCL 4 MG PO TABS: 4.0000 mg | ORAL_TABLET | Freq: Four times a day (QID) | ORAL | Status: DC | PRN

## 2017-10-05 MED ORDER — LURASIDONE HCL 80 MG PO TABS
80.0000 mg | ORAL_TABLET | Freq: Every day | ORAL | Status: DC
  Administered 2017-10-05: 80 mg via ORAL
  Filled 2017-10-05 (×3): qty 1

## 2017-10-05 MED ORDER — DONEPEZIL HCL 5 MG PO TABS
5.0000 mg | ORAL_TABLET | Freq: Every day | ORAL | Status: DC
  Administered 2017-10-05: 22:00:00 5 mg via ORAL
  Filled 2017-10-05 (×3): qty 1

## 2017-10-05 MED ORDER — MORPHINE SULFATE (PF) 2 MG/ML IV SOLN
2.0000 mg | INTRAVENOUS | Status: DC | PRN
  Administered 2017-10-05 (×4): 2 mg via INTRAVENOUS
  Filled 2017-10-05: qty 2
  Filled 2017-10-05 (×2): qty 1

## 2017-10-05 MED ORDER — PHENYTOIN SODIUM EXTENDED 100 MG PO CAPS
400.0000 mg | ORAL_CAPSULE | Freq: Every day | ORAL | Status: DC
  Administered 2017-10-05 – 2017-10-06 (×2): 400 mg via ORAL
  Filled 2017-10-05 (×2): qty 4

## 2017-10-05 MED ORDER — PANTOPRAZOLE SODIUM 40 MG PO TBEC
40.0000 mg | DELAYED_RELEASE_TABLET | Freq: Two times a day (BID) | ORAL | Status: DC
  Administered 2017-10-05 – 2017-10-06 (×3): 40 mg via ORAL
  Filled 2017-10-05 (×3): qty 1

## 2017-10-05 MED ORDER — FAMOTIDINE IN NACL 20-0.9 MG/50ML-% IV SOLN
20.0000 mg | Freq: Two times a day (BID) | INTRAVENOUS | Status: DC
  Administered 2017-10-05 (×2): 20 mg via INTRAVENOUS
  Filled 2017-10-05 (×2): qty 50

## 2017-10-05 MED ORDER — ATORVASTATIN CALCIUM 20 MG PO TABS
40.0000 mg | ORAL_TABLET | Freq: Every day | ORAL | Status: DC
  Administered 2017-10-05: 40 mg via ORAL
  Filled 2017-10-05: qty 2

## 2017-10-05 MED ORDER — NITROGLYCERIN 0.4 MG SL SUBL
0.4000 mg | SUBLINGUAL_TABLET | SUBLINGUAL | Status: DC | PRN
  Administered 2017-10-05: 0.4 mg via SUBLINGUAL
  Filled 2017-10-05: qty 1

## 2017-10-05 MED ORDER — ACETAMINOPHEN 325 MG PO TABS: 650.0000 mg | ORAL_TABLET | Freq: Four times a day (QID) | ORAL | Status: DC | PRN

## 2017-10-05 MED ORDER — LAMOTRIGINE 100 MG PO TABS
300.0000 mg | ORAL_TABLET | Freq: Every day | ORAL | Status: DC
  Administered 2017-10-06: 300 mg via ORAL
  Filled 2017-10-05: qty 3

## 2017-10-05 MED ORDER — TIZANIDINE HCL 2 MG PO TABS
2.0000 mg | ORAL_TABLET | Freq: Two times a day (BID) | ORAL | Status: DC
  Administered 2017-10-05 – 2017-10-06 (×2): 2 mg via ORAL
  Filled 2017-10-05 (×5): qty 1

## 2017-10-05 MED ORDER — POTASSIUM CHLORIDE CRYS ER 10 MEQ PO TBCR
10.0000 meq | EXTENDED_RELEASE_TABLET | Freq: Every day | ORAL | Status: DC
  Administered 2017-10-05 – 2017-10-06 (×2): 10 meq via ORAL
  Filled 2017-10-05 (×2): qty 1

## 2017-10-05 MED ORDER — CYCLOBENZAPRINE HCL 10 MG PO TABS
5.0000 mg | ORAL_TABLET | Freq: Three times a day (TID) | ORAL | Status: DC | PRN
  Administered 2017-10-05: 20:00:00 5 mg via ORAL
  Filled 2017-10-05: qty 1

## 2017-10-05 MED ORDER — BOOST / RESOURCE BREEZE PO LIQD CUSTOM
1.0000 | Freq: Three times a day (TID) | ORAL | Status: DC
  Administered 2017-10-05 – 2017-10-06 (×2): 1 via ORAL

## 2017-10-05 MED ORDER — DOCUSATE SODIUM 100 MG PO CAPS
100.0000 mg | ORAL_CAPSULE | Freq: Two times a day (BID) | ORAL | Status: DC
  Administered 2017-10-05 – 2017-10-06 (×2): 100 mg via ORAL
  Filled 2017-10-05 (×2): qty 1

## 2017-10-05 MED ORDER — LOSARTAN POTASSIUM 50 MG PO TABS: 50.0000 mg | ORAL_TABLET | Freq: Every day | ORAL | Status: DC

## 2017-10-05 MED ORDER — LABETALOL HCL 5 MG/ML IV SOLN: 5.0000 mg | INTRAVENOUS | Status: DC | PRN

## 2017-10-05 MED ORDER — NORTRIPTYLINE HCL 10 MG PO CAPS
20.0000 mg | ORAL_CAPSULE | Freq: Every day | ORAL | Status: DC
  Administered 2017-10-05: 20 mg via ORAL
  Filled 2017-10-05 (×3): qty 2

## 2017-10-05 MED ORDER — ESTRADIOL 0.1 MG/GM VA CREA
1.0000 | TOPICAL_CREAM | VAGINAL | Status: DC
  Filled 2017-10-05: qty 42.5

## 2017-10-05 MED ORDER — NITROGLYCERIN 0.4 MG SL SUBL
SUBLINGUAL_TABLET | SUBLINGUAL | Status: AC
  Filled 2017-10-05: qty 1

## 2017-10-05 MED ORDER — LABETALOL HCL 200 MG PO TABS
600.0000 mg | ORAL_TABLET | Freq: Two times a day (BID) | ORAL | Status: DC
  Filled 2017-10-05 (×4): qty 3

## 2017-10-05 MED ORDER — LOSARTAN POTASSIUM 50 MG PO TABS
100.0000 mg | ORAL_TABLET | Freq: Every day | ORAL | Status: DC
Start: 2017-10-05 — End: 2017-10-06
  Administered 2017-10-06: 09:00:00 100 mg via ORAL
  Filled 2017-10-05: qty 2

## 2017-10-05 MED ORDER — VERAPAMIL HCL ER 300 MG PO CP24
300.0000 mg | ORAL_CAPSULE | Freq: Every evening | ORAL | Status: DC
  Filled 2017-10-05 (×3): qty 1

## 2017-10-05 NOTE — Care Management Note (Signed)
Case Management Note  Patient Details  Name: Robin Jensen MRN: [id number] Date of Birth: Jul 07, 1960  Subjective/Objective: Admitted to [facility] under observation status with the diagnosis of ataxia. Lives with fiance [name] [phone]). Seen Dr. Clide Deutscher last month. Prescriptions are filled at [facility] in [place]. No home health in the past. Outpatient rehabilitation in the past. No skilled facility. No home oxygen. Rolling walker in the home. Takes care of all basic activities of daily living herself, doesn't drive. Multiple falls. Lost 50 pounds in the last month. Fiance will transport.                   Action/Plan: Will continue to follow for discharge plans   Expected Discharge Date:  10/06/17               Expected Discharge Plan:     In-House Referral:     Discharge planning Services     Post Acute Care Choice:    Choice offered to:     DME Arranged:    DME Agency:     HH Arranged:    HH Agency:     Status of Service:     If discussed at [employer] [facility] [employer], dates discussed:    Additional Comments:  Shelbie Ammons, RN MSN CCM Care Management [phone] 10/05/2017, 9:26 AM

## 2017-10-05 NOTE — H&P (Addendum)
Robin Jensen is an 57 y.o. female.   Chief Complaint: Fall HPI: The patient with past medical history of cerebral aneurysm status post clipping, urine cancer, cerebrovascular accident, hypertension and hypothyroidism presents to the emergency department after a series of falls.  The patient states that her symptoms began yesterday when she felt as if she would wobble anterior to posterior when trying to stand and walk.  She admits to feeling lightheaded as if she might lose consciousness but she did not actually faint.  Today she had similar symptoms and collapsed sideways onto the side of her vehicle.  She did not lose consciousness with this episode either.  In the emergency department CT and MRI of her brain showed stable clipped aneurysm and no hemorrhage or ischemic areas.  Physical exam per emergency physician revealed ataxia to the left as well as imbalance with forward and backward upright positioning which prompted the emergency department staff to call the hospitalist service for admission.  Past Medical History:  Diagnosis Date  . Aneurysm ([place])    head aneurysm   . Anxiety   . Bipolar 1 disorder ([place])   . Cancer ([place])    uterine  . Depression   . Dry mouth   . DVT of leg (deep venous thrombosis) ([facility])    history of  . Easy bruising   . Eye dryness   . Fatigue   . GERD (gastroesophageal reflux disease)   . Hyperlipidemia   . Hypertension   . Hypothyroid   . Migraine   . Migraine   . Seizure disorder ([place])   . Seizures ([place])    last year last seizure-had brain aneurysm. ON meds  . Stroke ([place]) 1997   No deficits  . Suicidal ideation    attempted overdoses in past    Past Surgical History:  Procedure Laterality Date  . ABDOMINAL HYSTERECTOMY    . CEREBRAL ANEURYSM REPAIR  1997  . CESAREAN SECTION    . CHOLECYSTECTOMY    . COLONOSCOPY WITH PROPOFOL N/A 08/20/2012   Procedure: COLONOSCOPY WITH PROPOFOL;  Surgeon: [name], MD;  Location: [facility];  Service:  Endoscopy;  Laterality: N/A;  In cecum at [place]; Total Withdrawal Time= 12 minutes  . COLONOSCOPY WITH PROPOFOL N/A 09/28/2017   Procedure: COLONOSCOPY WITH PROPOFOL;  Surgeon: [name], MD;  Location: [facility] ENDOSCOPY;  Service: Endoscopy;  Laterality: N/A;  . ESOPHAGOGASTRODUODENOSCOPY (EGD) WITH PROPOFOL N/A 11/20/2016   Procedure: ESOPHAGOGASTRODUODENOSCOPY (EGD) WITH PROPOFOL;  Surgeon: [name], MD;  Location: [facility] ENDOSCOPY;  Service: Endoscopy;  Laterality: N/A;  . ESOPHAGOGASTRODUODENOSCOPY (EGD) WITH PROPOFOL N/A 09/28/2017   Procedure: ESOPHAGOGASTRODUODENOSCOPY (EGD) WITH PROPOFOL;  Surgeon: [name], MD;  Location: [facility] ENDOSCOPY;  Service: Endoscopy;  Laterality: N/A;  . RADICAL HYSTERECTOMY      Family History  Problem Relation Age of Onset  . Colon cancer Father        less than age 43  . Colon cancer Paternal Uncle        10s  . Colon cancer Paternal Grandfather   . Breast cancer Maternal Aunt   . Diabetes Sister   . Arthritis Brother   . Hematuria Mother   . Kidney cancer Maternal Grandmother   . Kidney disease Maternal Grandmother   . Prostate cancer Neg Hx    Social History:  reports that she has been smoking.  She has never used smokeless tobacco. She reports that she does not drink alcohol or use drugs.  Allergies:  Allergies  Allergen Reactions  . Bee Venom Anaphylaxis  . Penicillins Shortness Of Breath and Rash  . Latex   . Lisinopril   . Other   . Sulfa Antibiotics     Medications Prior to Admission  Medication Sig Dispense Refill  . atorvastatin (LIPITOR) 40 MG tablet Take 40 mg by mouth at bedtime.     . cyclobenzaprine (FLEXERIL) 10 MG tablet Take 10 mg by mouth every 8 (eight) hours as needed for muscle spasms.     . diazepam (VALIUM) 5 MG tablet Take 5 mg by mouth at bedtime.  0  . donepezil (ARICEPT) 5 MG tablet Take 5 mg by mouth at bedtime.     Marland Kitchen EPINEPHrine 0.3 mg/0.3 mL IJ SOAJ injection Inject 0.3 mg into the muscle  as needed. For anaphylactic reaction    . estradiol (ESTRACE VAGINAL) 0.1 MG/GM vaginal cream Apply 0.59m (pea-sized amount)  just inside the vaginal introitus with a finger-tip on Monday, Wednesday and Friday nights. 30 g 12  . gabapentin (NEURONTIN) 300 MG capsule Take 300 mg by mouth 3 (three) times daily.    .Marland Kitchenlabetalol (NORMODYNE) 200 MG tablet Take 600 mg by mouth 2 (two) times daily.    .Marland KitchenlamoTRIgine (LAMICTAL) 200 MG tablet Take 300 mg by mouth daily.     .Marland Kitchenlevothyroxine (SYNTHROID, LEVOTHROID) 75 MCG tablet Take 1 tablet (75 mcg total) by mouth daily before breakfast. 30 tablet 8  . losartan (COZAAR) 50 MG tablet Take 1 tablet (50 mg total) by mouth daily. 30 tablet 0  . losartan-hydrochlorothiazide (HYZAAR) 100-25 MG per tablet Take 1 tablet by mouth daily. 90 tablet 3  . lurasidone (LATUDA) 80 MG TABS tablet Take 80 mg by mouth at bedtime.     . nortriptyline (PAMELOR) 10 MG capsule Take 20 mg by mouth at bedtime.   1  . pantoprazole (PROTONIX) 40 MG tablet Take 40 mg by mouth 2 (two) times daily.    . phenytoin (DILANTIN) 200 MG ER capsule Take 400 mg by mouth daily.     . potassium chloride (K-DUR) 10 MEQ tablet Take 10 mEq by mouth daily.  0  . tiZANidine (ZANAFLEX) 2 MG tablet Take 2 mg by mouth 2 (two) times daily.  0  . Verapamil HCl CR 300 MG CP24 Take 1 capsule (300 mg total) by mouth daily. (Patient taking differently: Take 300 mg by mouth every evening. ) 30 each 5  . nitrofurantoin, macrocrystal-monohydrate, (MACROBID) 100 MG capsule Take 1 capsule (100 mg total) by mouth every 12 (twelve) hours. (Patient not taking: Reported on 10/04/2017) 14 capsule 0    Results for orders placed or performed during the hospital encounter of 10/04/17 (from the past 48 hour(s))  CBC     Status: Abnormal   Collection Time: 10/04/17  4:21 PM  Result Value Ref Range   WBC 6.7 3.6 - 11.0 K/uL   RBC 4.16 3.80 - 5.20 MIL/uL   Hemoglobin 12.2 12.0 - 16.0 g/dL   HCT 34.9 (L) 35.0 - 47.0 %    MCV 84.1 80.0 - 100.0 fL   MCH 29.3 26.0 - 34.0 pg   MCHC 34.8 32.0 - 36.0 g/dL   RDW 14.7 (H) 11.5 - 14.5 %   Platelets 208 150 - 440 K/uL    Comment: [facility] at [facility] [address] [place] [id number] Differential     Status: None   Collection Time: 10/04/17  4:21 PM  Result  Value Ref Range   Neutrophils Relative % 58 %   Neutro Abs 3.8 1.4 - 6.5 K/uL   Lymphocytes Relative 34 %   Lymphs Abs 2.3 1.0 - 3.6 K/uL   Monocytes Relative 6 %   Monocytes Absolute 0.4 0.2 - 0.9 K/uL   Eosinophils Relative 1 %   Eosinophils Absolute 0.1 0 - 0.7 K/uL   Basophils Relative 1 %   Basophils Absolute 0.1 0 - 0.1 K/uL    Comment: Performed at [facility], [address] [place], [place] [address]  Comprehensive metabolic panel     Status: Abnormal   Collection Time: 10/04/17  4:21 PM  Result Value Ref Range   Sodium 136 135 - 145 mmol/L   Potassium 3.8 3.5 - 5.1 mmol/L   Chloride 99 (L) 101 - 111 mmol/L   CO2 26 22 - 32 mmol/L   Glucose, Bld 102 (H) 65 - 99 mg/dL   BUN 17 6 - 20 mg/dL   Creatinine, Ser 1.25 (H) 0.44 - 1.00 mg/dL   Calcium 9.0 8.9 - 10.3 mg/dL   Total Protein 6.7 6.5 - 8.1 g/dL   Albumin 3.8 3.5 - 5.0 g/dL   AST 33 15 - 41 U/L   ALT 20 14 - 54 U/L   Alkaline Phosphatase 71 38 - 126 U/L   Total Bilirubin 0.3 0.3 - 1.2 mg/dL   GFR calc non Af Amer 47 (L) >60 mL/min   GFR calc Af Amer 54 (L) >60 mL/min    Comment: (NOTE) The eGFR has been calculated using the CKD EPI equation. This calculation has not been validated in all clinical situations. eGFR's persistently <60 mL/min signify possible Chronic Kidney Disease.    Anion gap 11 5 - 15    Comment: Performed at [facility], [address] [place], [place] [address]  Troponin I     Status: None   Collection Time: 10/04/17  4:21 PM  Result Value Ref Range   Troponin I <0.03 <0.03 ng/mL    Comment: Performed at [facility], [address] [place],  [place] [address]  Protime-INR     Status: None   Collection Time: 10/04/17  5:50 PM  Result Value Ref Range   Prothrombin Time 12.6 11.4 - 15.2 seconds   INR 0.95     Comment: Performed at [facility], [address] [place], [place] [address]  APTT     Status: None   Collection Time: 10/04/17  5:50 PM  Result Value Ref Range   aPTT 27 24 - 36 seconds    Comment: Performed at [facility], [address] [place], [place] [address]  Pregnancy, urine POC     Status: None   Collection Time: 10/04/17  6:01 PM  Result Value Ref Range   Preg Test, Ur NEGATIVE NEGATIVE    Comment:        THE SENSITIVITY OF THIS METHODOLOGY IS >24 mIU/mL    Ct Angio Head W Or Wo Contrast  Result Date: 10/04/2017 CLINICAL DATA:  Initial evaluation for increased falls, stuttering speech. EXAM: CT ANGIOGRAPHY HEAD AND NECK TECHNIQUE: Multidetector CT imaging of the head and neck was performed using the standard protocol during bolus administration of intravenous contrast. Multiplanar CT image reconstructions and MIPs were obtained to evaluate the vascular anatomy. Carotid stenosis measurements (when applicable) are obtained utilizing NASCET criteria, using the distal internal carotid diameter as the denominator. CONTRAST:  40m ISOVUE-370 IOPAMIDOL (ISOVUE-370) INJECTION 76%  COMPARISON:  Prior CTA from 08/08/2017. FINDINGS: CTA NECK FINDINGS Aortic arch: Visualized aortic arch of normal caliber with normal branch pattern. Minimal plaque within the arch itself without significant stenosis about the origin of the great vessels. Visualized subclavian arteries widely patent. Right carotid system: Right common carotid artery widely patent from its origin to the bifurcation. No significant atheromatous narrowing about the right carotid bifurcation. Right ICA patent from the bifurcation to the skull base without stenosis, dissection, or occlusion. Left carotid system: Left common carotid artery patent from its  origin to the bifurcation without stenosis. Mild mixed plaque about the left bifurcation without significant stenosis. Left ICA widely patent distally without stenosis, dissection, or occlusion. Vertebral arteries: Left vertebral artery arises separately from the aortic arch. Right vertebral artery arises from the right subclavian artery. Vertebral arteries are largely code dominant. Vertebral arteries widely patent within the neck without stenosis, dissection, or occlusion. Skeleton: No acute osseous abnormality. No worrisome lytic or blastic osseous lesions. Mild cervical spondylolysis at C6-7. Other neck: No acute soft tissue abnormality within the neck. Thyroid normal. Salivary glands normal. No significant adenopathy. Upper chest: Visualized upper chest within normal limits. Visualized lungs are clear. Review of the MIP images confirms the above findings CTA HEAD FINDINGS Anterior circulation: Petrous segments widely patent bilaterally. Calcified plaque at the anterior genu of the cavernous right ICA without significant stenosis. Scattered atheromatous plaque about the cavernous and supraclinoid left ICA without high-grade stenosis, stable from previous. 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 No residual aneurysm identified. ICA termini widely patent bilaterally. A1 segments and anterior communicating artery widely patent bilaterally. Mild to moderate atheromatous irregularity within the ACA is bilaterally, greater on the left, which do remain patent to their distal aspects. M1 segments patent bilaterally without high-grade stenosis. Mild atheromatous irregularity about the left MCA bifurcation without high-grade stenosis. No proximal M2 occlusion distal MCA branches well perfused and symmetric with mild atheromatous irregularity. Posterior circulation: Vertebral arteries widely patent to the vertebrobasilar junction without stenosis. Patent right PICA. Dominant left AICA. Basilar artery  widely patent to its distal aspect without stenosis. Superior cerebral arteries patent bilaterally. Posterior cerebral arteries primarily supplied via the basilar. PCAs demonstrate mild atheromatous irregularity but are widely patent to their distal aspects without hemodynamically significant stenosis. Small/hypoplastic bilateral posterior communicating arteries noted. Venous sinuses: Patent. Anatomic variants: None. Delayed phase: No abnormal enhancement. Review of the MIP images confirms the above findings IMPRESSION: CTA OF THE NECK: 1. Negative CTA of the neck with no large vessel occlusion or hemodynamically significant stenosis. 2. Minor atheromatous change about the aortic arch and left carotid bifurcation without stenosis. CTA OF THE HEAD 1. Prior supraclinoid right ICA aneurysm clipping without complicating features. No residual aneurysm. 2. Mild intracranial atherosclerotic change involving the anterior and posterior circulation, most notable at the distal left ACA. No high-grade proximal or correctable stenosis identified. Overall, appearance is stable relative to recent CTA from 08/08/2017. 3. No other acute intracranial abnormality. Electronically Signed   By: Jeannine Boga M.D.   On: 10/04/2017 19:48   Ct Head Wo Contrast  Result Date: 10/04/2017 CLINICAL DATA:  Initial evaluation for increase falls with stuttering speech, blurry vision. EXAM: CT HEAD WITHOUT CONTRAST TECHNIQUE: Contiguous axial images were obtained from the base of the skull through the vertex without intravenous contrast. COMPARISON:  Prior CT from 08/08/2017. FINDINGS: Brain: Mild age-related cerebral atrophy with chronic small vessel ischemic disease. No acute intracranial hemorrhage. No acute large vessel territory infarct. No  mass lesion, midline shift or mass effect. No hydrocephalus. No extra-axial fluid collection. Vascular: Sequelae of prior surgical clipping of supraclinoid right ICA aneurysm. No hyperdense  vessel. Skull: Prior right pterional craniotomy for surgical clipping of aneurysm. Mild plagiocephaly. No acute skull fracture. Scalp soft tissues demonstrate no acute abnormality. Sinuses/Orbits: Globes and orbital soft tissues within normal limits. No significant paranasal sinus disease. Mastoid air cells are clear. Other: None. IMPRESSION: 1. No acute intracranial abnormality. 2. 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 Electronically Signed   By: Jeannine Boga M.D.   On: 10/04/2017 17:02   Ct Angio Neck W And/or Wo Contrast  Result Date: 10/04/2017 CLINICAL DATA:  Initial evaluation for increased falls, stuttering speech. EXAM: CT ANGIOGRAPHY HEAD AND NECK TECHNIQUE: Multidetector CT imaging of the head and neck was performed using the standard protocol during bolus administration of intravenous contrast. Multiplanar CT image reconstructions and MIPs were obtained to evaluate the vascular anatomy. Carotid stenosis measurements (when applicable) are obtained utilizing NASCET criteria, using the distal internal carotid diameter as the denominator. CONTRAST:  25m ISOVUE-370 IOPAMIDOL (ISOVUE-370) INJECTION 76% COMPARISON:  Prior CTA from 08/08/2017. FINDINGS: CTA NECK FINDINGS Aortic arch: Visualized aortic arch of normal caliber with normal branch pattern. Minimal plaque within the arch itself without significant stenosis about the origin of the great vessels. Visualized subclavian arteries widely patent. Right carotid system: Right common carotid artery widely patent from its origin to the bifurcation. No significant atheromatous narrowing about the right carotid bifurcation. Right ICA patent from the bifurcation to the skull base without stenosis, dissection, or occlusion. Left carotid system: Left common carotid artery patent from its origin to the bifurcation without stenosis. Mild mixed plaque about the left bifurcation without significant stenosis. Left ICA widely  patent distally without stenosis, dissection, or occlusion. Vertebral arteries: Left vertebral artery arises separately from the aortic arch. Right vertebral artery arises from the right subclavian artery. Vertebral arteries are largely code dominant. Vertebral arteries widely patent within the neck without stenosis, dissection, or occlusion. Skeleton: No acute osseous abnormality. No worrisome lytic or blastic osseous lesions. Mild cervical spondylolysis at C6-7. Other neck: No acute soft tissue abnormality within the neck. Thyroid normal. Salivary glands normal. No significant adenopathy. Upper chest: Visualized upper chest within normal limits. Visualized lungs are clear. Review of the MIP images confirms the above findings CTA HEAD FINDINGS Anterior circulation: Petrous segments widely patent bilaterally. Calcified plaque at the anterior genu of the cavernous right ICA without significant stenosis. Scattered atheromatous plaque about the cavernous and supraclinoid left ICA without high-grade stenosis, stable from previous. 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 No residual aneurysm identified. ICA termini widely patent bilaterally. A1 segments and anterior communicating artery widely patent bilaterally. Mild to moderate atheromatous irregularity within the ACA is bilaterally, greater on the left, which do remain patent to their distal aspects. M1 segments patent bilaterally without high-grade stenosis. Mild atheromatous irregularity about the left MCA bifurcation without high-grade stenosis. No proximal M2 occlusion distal MCA branches well perfused and symmetric with mild atheromatous irregularity. Posterior circulation: Vertebral arteries widely patent to the vertebrobasilar junction without stenosis. Patent right PICA. Dominant left AICA. Basilar artery widely patent to its distal aspect without stenosis. Superior cerebral arteries patent bilaterally. Posterior cerebral arteries  primarily supplied via the basilar. PCAs demonstrate mild atheromatous irregularity but are widely patent to their distal aspects without hemodynamically significant stenosis. Small/hypoplastic bilateral posterior communicating arteries noted. Venous sinuses: Patent. Anatomic variants: None. Delayed phase:  No abnormal enhancement. Review of the MIP images confirms the above findings IMPRESSION: CTA OF THE NECK: 1. Negative CTA of the neck with no large vessel occlusion or hemodynamically significant stenosis. 2. Minor atheromatous change about the aortic arch and left carotid bifurcation without stenosis. CTA OF THE HEAD 1. Prior supraclinoid right ICA aneurysm clipping without complicating features. No residual aneurysm. 2. Mild intracranial atherosclerotic change involving the anterior and posterior circulation, most notable at the distal left ACA. No high-grade proximal or correctable stenosis identified. Overall, appearance is stable relative to recent CTA from 08/08/2017. 3. No other acute intracranial abnormality. Electronically Signed   By: Jeannine Boga M.D.   On: 10/04/2017 19:48   Mr Brain Wo Contrast  Result Date: 10/04/2017 CLINICAL DATA:  Increasing gait imbalance of falls, stuttering speech, fatigue. History of stroke, brain aneurysm (s/p supraclinoid aneurysm clipping), hypertension, hyperlipidemia, seizures. EXAM: MRI HEAD WITHOUT CONTRAST TECHNIQUE: Multiplanar, multiecho pulse sequences of the brain and surrounding structures were obtained without intravenous contrast. COMPARISON:  CT head October 04, 2017 and MRI of the head July 21, 2010 FINDINGS: Mild motion degraded examination. INTRACRANIAL CONTENTS: No reduced diffusion to suggest acute ischemia. No susceptibility artifact to suggest hemorrhage. Borderline parenchymal brain volume loss for age. Patchy supratentorial white matter FLAIR T2 hyperintensities are unchanged no suspicious parenchymal signal, masses, mass effect. No abnormal  extra-axial fluid collections. No extra-axial masses. VASCULAR: Susceptibility artifact associated with RIGHT ICA aneurysm clip and RIGHT craniotomy. SKULL AND UPPER CERVICAL SPINE: No abnormal sellar expansion. No suspicious calvarial bone marrow signal. S post RIGHT craniotomy. Craniocervical junction maintained. SINUSES/ORBITS: The mastoid air-cells and included paranasal sinuses are well-aerated.The included ocular globes and orbital contents are non-suspicious. OTHER: None. IMPRESSION: 1. No acute intracranial process and mildly motion degraded examination. 2. Status post ICA aneurysm clipping. 3. Stable borderline parenchymal brain volume loss for age and mild chronic small vessel ischemic changes. Electronically Signed   By: Elon Alas M.D.   On: 10/04/2017 21:55    Review of Systems  Constitutional: Negative for chills and fever.  HENT: Negative for sore throat and tinnitus.   Eyes: Negative for blurred vision and redness.  Respiratory: Negative for cough and shortness of breath.   Cardiovascular: Negative for chest pain, palpitations, orthopnea and PND.  Gastrointestinal: Negative for abdominal pain, diarrhea, nausea and vomiting.  Genitourinary: Negative for dysuria, frequency and urgency.  Musculoskeletal: Positive for falls and neck pain (left side). Negative for joint pain and myalgias.  Skin: Negative for rash.       No lesions  Neurological: Positive for speech change and focal weakness. Negative for weakness.  Endo/Heme/Allergies: Does not bruise/bleed easily.       No temperature intolerance  Psychiatric/Behavioral: Negative for depression and suicidal ideas.    Blood pressure (!) 178/98, pulse 62, temperature 97.8 F (36.6 C), temperature source Oral, resp. rate 18, height 5' 1"  (1.549 m), weight 66.2 kg (146 lb), SpO2 100 %. Physical Exam  Vitals reviewed. Constitutional: She is oriented to person, place, and time. She appears well-developed and well-nourished. No  distress.  HENT:  Head: Normocephalic and atraumatic.  Mouth/Throat: Oropharynx is clear and moist.  Eyes: Pupils are equal, round, and reactive to light. Conjunctivae and EOM are normal. No scleral icterus.  Neck: Normal range of motion. Neck supple. No JVD present. No tracheal deviation present. No thyromegaly present.  Cardiovascular: Normal rate, regular rhythm and normal heart sounds. Exam reveals no gallop and no friction rub.  No murmur heard. Respiratory:  Effort normal and breath sounds normal.  GI: Soft. Bowel sounds are normal. She exhibits no distension. There is no tenderness.  Genitourinary:  Genitourinary Comments: Deferred  Musculoskeletal: Normal range of motion. She exhibits no edema.  Lymphadenopathy:    She has no cervical adenopathy.  Neurological: She is alert and oriented to person, place, and time. No cranial nerve deficit. She exhibits normal muscle tone.  Skin: Skin is warm and dry. No rash noted. No erythema.  Psychiatric: She has a normal mood and affect. Her behavior is normal. Judgment and thought content normal.     Assessment/Plan This is a 57 year old female admitted for imbalance.  1.  Imbalance: The patient reports no visual problems or sensory deficits.  Usually, physical exam feels weakness of her left lower extremity but with distraction the patient is able to maintain resistance with dorsiflexion of her left foot.  Due to her fall risk this examiner did not stand the patient again to repeat tests for balance and ataxia.  Rapid alternating movement of upper extremities intact.  The patient states that her speech is also abnormal.  She reports history of TIA that presented with similar symptoms.  The patient has reportedly failed her swallow screen so she is now n.p.o.  Follow NIH stroke scale assessment.  Unclear of any stroke syndrome with all of the above features.  Differential diagnosis includes psychiatric origin and/or polypharmacy.  I have adjusted  the patient's muscle relaxers 2.  Hypertension: Uncontrolled although if the patient is experiencing an evolving stroke we will allow permissive hypertension.  Resume losartan, verapamil, hydrochlorothiazide and labetalol in the morning. 3.  Seizure disorder: Continue Dilantin and lamotrigine Exline 4.  Hypothyroidism: Check TSH; continue Synthroid 5.  Mood disorder: Continue Latuda and nortriptyline.  Also continue Aricept 6.  Muscle spasms: Continue tizanidine and Valium per home regimen.  Flexeril as needed 7.  DVT prophylaxis: Heparin 8.  GI prophylaxis: Pantoprazole per home regimen Patient is a full code.  Time spent on admission orders and patient care proximally 45 minutes  Harrie Foreman, MD 10/05/2017, 1:52 AM

## 2017-10-05 NOTE — Progress Notes (Signed)
Initial Nutrition Assessment  DOCUMENTATION CODES:   Not applicable  INTERVENTION:  Boost Breeze po TID, each supplement provides 250 kcal and 9 grams of protein  MVI w/ Minerals  Provided "Gastroparesis Nutrition Therapy" handout from the [employer]. Encouraged her to eat small, frequent meals daily and decrease fiber intake. Also Discussed avoiding spicy, fried, and greasy foods. Recommended she decrease caffeine and carbonated beverage intake. Expect fair compliance.  NUTRITION DIAGNOSIS:   Inadequate oral intake related to altered GI function(depression) as evidenced by per patient/family report.  GOAL:   Patient will meet greater than or equal to 90% of their needs  MONITOR:   Supplement acceptance, PO intake, Weight trends  REASON FOR ASSESSMENT:   Malnutrition Screening Tool    ASSESSMENT:   Patient with PMH Clipped Cerebral Aneurysm, Uterine Cancer, CVA, HTN, and Hypothyroidism presents with a series of falls, likely secondary to orthostatic hypotension.   Spoke with patient at bedside. She states she was 300 pounds 1 year ago and began to lose weight following her mother's death. Per chart review her weight has fluctuated between 145-160 pounds over the past year with no significant weight loss.  Normal PO intake for her consists of a few cups of coffee in the morning, and 1 meal per day, normally something cooked by her fiancee around dinner time. Drinks around [employer] daily.  In addition to this, patient states sometimes she will go 1 week without eating due to depression.   Patient underwent a gastric emptying study on 08/09/2017 which demonstrated only 31% at 4 hours. Also underwent an EGD on 09/28/2017, biopsies are pending. Currently she describes reflux-like symptoms and coughing with PO intake. Provided "gastroparesis nutrition therapy" handout from the [employer].  Patient was appreciative,  states she had never been provided education before.  It is unlikely she is meeting her calorie needs based on information provided. She also exhibits moderate muscle wasting in her lower extremities. However, patient states she has mostly stayed at home since her mother's death 1 year ago.  Unable to diagnose malnutrition at this time.  Labs reviewed Medications reviewed and include:  Colace, 45meq K+   NUTRITION - FOCUSED PHYSICAL EXAM:    Most Recent Value  Orbital Region  Mild depletion  Upper Arm Region  No depletion  Thoracic and Lumbar Region  No depletion  Buccal Region  No depletion  Temple Region  Mild depletion  Clavicle Bone Region  No depletion  Clavicle and Acromion Bone Region  No depletion  Scapular Bone Region  No depletion  Dorsal Hand  No depletion  Patellar Region  Moderate depletion  Anterior Thigh Region  Moderate depletion  Posterior Calf Region  Moderate depletion  Hair  Reviewed [pt reports hair loss]  Eyes  Reviewed  Mouth  Reviewed  Skin  Reviewed [pale palms]  Nails  Reviewed [white nail beds]       Diet Order:   Diet Order           DIET SOFT Room service appropriate? Yes; Fluid consistency: Thin  Diet effective now          EDUCATION NEEDS:   Education needs have been addressed  Skin:  Skin Assessment: Reviewed RN Assessment  Last BM:  PTA  Height:   Ht Readings from Last 1 Encounters:  10/04/17 5\' 1"  (1.549 m)    Weight:   Wt Readings from Last 1 Encounters:  10/05/17 151 lb 12.8  oz (68.9 kg)    Ideal Body Weight:  47.72 kg  BMI:  Body mass index is 28.68 kg/m.  Estimated Nutritional Needs:   Kcal:  1457-1700 calories (MSJ x1.2-1.4)  Protein:  69-75 grams (1.0-1.1 g/kg)  Fluid:  >1.5L    Satira Anis. Jacque Byron, MS, RD LDN Inpatient Clinical Dietitian Pager [phone]

## 2017-10-05 NOTE — Consult Note (Signed)
Reason for Consult:Gait ataxia Referring Physician: Salary  CC: Gait ataxia  HPI: Robin Jensen is an 57 y.o. female presenting with complaints of difficulty with balance.  Patient reports that she has had intermittent difficulty with balance for years.  Last episode was about a year ago and her balance became so bad that she fell.  With this episode felt that her balance was becoming bad again and did not want to hurt herself so presented to the ED for further evaluation.    Past Medical History:  Diagnosis Date  . 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 Anxiety   . Bipolar 1 disorder ([place])   . Cancer ([place])    uterine  . Depression   . Dry mouth   . DVT of leg (deep venous thrombosis) ([facility])    history of  . Easy bruising   . Eye dryness   . Fatigue   . GERD (gastroesophageal reflux disease)   . Hyperlipidemia   . Hypertension   . Hypothyroid   . Migraine   . Migraine   . Seizure disorder ([place])   . 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 ON meds  . Stroke ([place]) 1997   No deficits  . Suicidal ideation    attempted overdoses in past    Past Surgical History:  Procedure Laterality Date  . ABDOMINAL HYSTERECTOMY    . CEREBRAL ANEURYSM REPAIR  1997  . CESAREAN SECTION    . CHOLECYSTECTOMY    . COLONOSCOPY WITH PROPOFOL N/A 08/20/2012   Procedure: COLONOSCOPY WITH PROPOFOL;  Surgeon: [name], MD;  Location: [facility];  Service: Endoscopy;  Laterality: N/A;  In cecum at [place]; Total Withdrawal Time= 12 minutes  . COLONOSCOPY WITH PROPOFOL N/A 09/28/2017   Procedure: COLONOSCOPY WITH PROPOFOL;  Surgeon: [name], MD;  Location: [facility] ENDOSCOPY;  Service: Endoscopy;  Laterality: N/A;  . ESOPHAGOGASTRODUODENOSCOPY (EGD) WITH PROPOFOL N/A 11/20/2016   Procedure: ESOPHAGOGASTRODUODENOSCOPY (EGD) WITH PROPOFOL;  Surgeon: [name], MD;  Location: [facility] ENDOSCOPY;  Service: Endoscopy;  Laterality: N/A;  . ESOPHAGOGASTRODUODENOSCOPY (EGD) WITH  PROPOFOL N/A 09/28/2017   Procedure: ESOPHAGOGASTRODUODENOSCOPY (EGD) WITH PROPOFOL;  Surgeon: [name], MD;  Location: [facility] ENDOSCOPY;  Service: Endoscopy;  Laterality: N/A;  . RADICAL HYSTERECTOMY      Family History  Problem Relation Age of Onset  . Colon cancer Father        less than age 51  . Colon cancer Paternal Uncle        71s  . Colon cancer Paternal Grandfather   . Breast cancer Maternal Aunt   . Diabetes Sister   . Arthritis Brother   . Hematuria Mother   . Kidney cancer Maternal Grandmother   . Kidney disease Maternal Grandmother   . Prostate cancer Neg Hx     Social History:  reports that she has been smoking.  She has never used smokeless tobacco. She reports that she does not drink alcohol or use drugs.  Allergies  Allergen Reactions  . Bee Venom Anaphylaxis  . Penicillins Shortness Of Breath and Rash  . Latex   . Lisinopril   . Other   . Sulfa Antibiotics     Medications:  I have reviewed the patient's current medications. Prior to Admission:  Medications Prior to Admission  Medication Sig Dispense Refill Last Dose  . atorvastatin (LIPITOR) 40 MG tablet Take 40 mg by mouth at bedtime.    PRN  at PRN  . cyclobenzaprine (FLEXERIL) 10 MG tablet Take 10 mg by mouth every 8 (eight) hours as needed for muscle spasms.    PRN at PRN  . diazepam (VALIUM) 5 MG tablet Take 5 mg by mouth at bedtime.  0 10/03/2017 at Unknown time  . donepezil (ARICEPT) 5 MG tablet Take 5 mg by mouth at bedtime.    10/03/2017 at Unknown time  . EPINEPHrine 0.3 mg/0.3 mL IJ SOAJ injection Inject 0.3 mg into the muscle as needed. For anaphylactic reaction   As directed at As directed  . estradiol (ESTRACE VAGINAL) 0.1 MG/GM vaginal cream Apply 0.5mg  (pea-sized amount)  just inside the vaginal introitus with a finger-tip on Monday, Wednesday and Friday nights. 30 g 12 As directed at As directed  . gabapentin (NEURONTIN) 300 MG capsule Take 300 mg by mouth 3 (three) times daily.    10/04/2017 at Unknown time  . labetalol (NORMODYNE) 200 MG tablet Take 600 mg by mouth 2 (two) times daily.   10/04/2017 at Unknown time  . lamoTRIgine (LAMICTAL) 200 MG tablet Take 300 mg by mouth daily.    10/04/2017 at Unknown time  . levothyroxine (SYNTHROID, LEVOTHROID) 75 MCG tablet Take 1 tablet (75 mcg total) by mouth daily before breakfast. 30 tablet 8 10/04/2017 at Unknown time  . losartan (COZAAR) 50 MG tablet Take 1 tablet (50 mg total) by mouth daily. 30 tablet 0 10/04/2017 at Unknown time  . losartan-hydrochlorothiazide (HYZAAR) 100-25 MG per tablet Take 1 tablet by mouth daily. 90 tablet 3 10/04/2017 at Unknown time  . lurasidone (LATUDA) 80 MG TABS tablet Take 80 mg by mouth at bedtime.    10/03/2017 at Unknown time  . nortriptyline (PAMELOR) 10 MG capsule Take 20 mg by mouth at bedtime.   1 10/03/2017 at Unknown time  . pantoprazole (PROTONIX) 40 MG tablet Take 40 mg by mouth 2 (two) times daily.   10/04/2017 at Unknown time  . phenytoin (DILANTIN) 200 MG ER capsule Take 400 mg by mouth daily.    10/04/2017 at Unknown time  . potassium chloride (K-DUR) 10 MEQ tablet Take 10 mEq by mouth daily.  0 10/04/2017 at Unknown time  . tiZANidine (ZANAFLEX) 2 MG tablet Take 2 mg by mouth 2 (two) times daily.  0 10/04/2017 at Unknown time  . Verapamil HCl CR 300 MG CP24 Take 1 capsule (300 mg total) by mouth daily. (Patient taking differently: Take 300 mg by mouth every evening. ) 30 each 5 10/03/2017 at Unknown time  . nitrofurantoin, macrocrystal-monohydrate, (MACROBID) 100 MG capsule Take 1 capsule (100 mg total) by mouth every 12 (twelve) hours. (Patient not taking: Reported on 10/04/2017) 14 capsule 0 Not Taking at Unknown time   Scheduled: . atorvastatin  40 mg Oral QHS  . docusate sodium  100 mg Oral BID  . donepezil  5 mg Oral QHS  . estradiol  1 Applicatorful Vaginal Once per day on Mon Wed Fri  . gabapentin  300 mg Oral TID  . heparin  5,000 Units Subcutaneous Q8H  . losartan  100 mg Oral  Daily   And  . hydrochlorothiazide  25 mg Oral Daily  . labetalol  300 mg Oral BID  . lamoTRIgine  300 mg Oral Daily  . levothyroxine  75 mcg Oral QAC breakfast  . lurasidone  80 mg Oral QHS  . nitroGLYCERIN      . nortriptyline  20 mg Oral QHS  . pantoprazole  40 mg Oral BID  .  phenytoin  400 mg Oral Daily  . potassium chloride  10 mEq Oral Daily  . tiZANidine  2 mg Oral BID  . Verapamil HCl CR  300 mg Oral QPM    ROS: History obtained from the patient  General ROS: negative for - chills, fatigue, fever, night sweats, weight gain or weight loss Psychological ROS: negative for - behavioral disorder, hallucinations, memory difficulties, mood swings or suicidal ideation Ophthalmic ROS: negative for - blurry vision, double vision, eye pain or loss of vision ENT ROS: negative for - epistaxis, nasal discharge, oral lesions, sore throat, tinnitus or vertigo Allergy and Immunology ROS: negative for - hives or itchy/watery eyes Hematological and Lymphatic ROS: negative for - bleeding problems, bruising or swollen lymph nodes Endocrine ROS: negative for - galactorrhea, hair pattern changes, polydipsia/polyuria or temperature intolerance Respiratory ROS: negative for - cough, hemoptysis, shortness of breath or wheezing Cardiovascular ROS: negative for - chest pain, dyspnea on exertion, edema or irregular heartbeat Gastrointestinal ROS: negative for - abdominal pain, diarrhea, hematemesis, nausea/vomiting or stool incontinence Genito-Urinary ROS: negative for - dysuria, hematuria, incontinence or urinary frequency/urgency Musculoskeletal ROS: left hip pain Neurological ROS: as noted in HPI Dermatological ROS: negative for rash and skin lesion changes  Physical Examination: Blood pressure (!) 169/107, pulse 60, temperature 98.3 F (36.8 C), temperature source Oral, resp. rate 18, height 5\' 1"  (1.549 m), weight 68.9 kg (151 lb 12.8 oz), SpO2 100 %.  HEENT-  Normocephalic, no lesions, without  obvious abnormality.  Normal external eye and conjunctiva.  Normal TM's bilaterally.  Normal auditory canals and external ears. Normal external nose, mucus membranes and septum.  Normal pharynx. Cardiovascular- S1, S2 normal, pulses palpable throughout   Lungs- chest clear, no wheezing, rales, normal symmetric air entry Abdomen- soft, non-tender; bowel sounds normal; no masses,  no organomegaly Extremities- no edema Lymph-no adenopathy palpable Musculoskeletal-no joint tenderness, deformity or swelling Skin-warm and dry, no hyperpigmentation, vitiligo, or suspicious lesions  Neurological Examination   Mental Status: Alert, oriented, thought content appropriate.  Speech fluent without evidence of aphasia.  Able to follow 3 step commands without difficulty. Cranial Nerves: II: Discs flat bilaterally; Visual fields grossly normal, pupils equal, round, reactive to light and accommodation III,IV, VI: ptosis not present, extra-ocular motions intact bilaterally V,VII: decreased right NLF, facial light touch sensation normal bilaterally VIII: hearing normal bilaterally IX,X: gag reflex present XI: bilateral shoulder shrug XII: midline tongue extension Motor: Right : Upper extremity   5/5    Left:     Upper extremity   5/5  Lower extremity   5/5     Lower extremity   5/5 with some limitation proximally due to pain Tone and bulk:normal tone throughout; no atrophy noted Sensory: Pinprick and light touch intact throughout, bilaterally Deep Tendon Reflexes: 2+ and symmetric throughout Plantars: Right: downgoing   Left: downgoing Cerebellar: Normal finger-to-nose and normal heel-to-shin testing bilaterally Gait: not tested due to safety concerns    Laboratory Studies:   Basic Metabolic Panel: Recent Labs  Lab 10/04/17 1621  NA 136  K 3.8  CL 99*  CO2 26  GLUCOSE 102*  BUN 17  CREATININE 1.25*  CALCIUM 9.0    Liver Function Tests: Recent Labs  Lab 10/04/17 1621  AST 33  ALT 20   ALKPHOS 71  BILITOT 0.3  PROT 6.7  ALBUMIN 3.8   No results for input(s): LIPASE, AMYLASE in the last 168 hours. No results for input(s): AMMONIA in the last 168 hours.  CBC: Recent  Labs  Lab 10/04/17 1621  WBC 6.7  NEUTROABS 3.8  HGB 12.2  HCT 34.9*  MCV 84.1  PLT 208    Cardiac Enzymes: Recent Labs  Lab 10/04/17 1621 10/05/17 0329  TROPONINI <0.03 <0.03    BNP: Invalid input(s): POCBNP  CBG: No results for input(s): GLUCAP in the last 168 hours.  Microbiology: Results for orders placed or performed in visit on 06/20/17  Microscopic Examination     Status: Abnormal   Collection Time: 06/20/17  1:28 PM  Result Value Ref Range Status   WBC, UA >30 (H) 0 - 5 /hpf Final   RBC, UA 3-10 (A) 0 - 2 /hpf Final   Epithelial Cells (non renal) >10 (H) 0 - 10 /hpf Final   Mucus, UA Present (A) Not Estab. Final   Bacteria, UA Many (A) None seen/Few Final  CULTURE, URINE COMPREHENSIVE     Status: Abnormal   Collection Time: 06/20/17  2:05 PM  Result Value Ref Range Status   Urine Culture, Comprehensive Final report (A)  Final   Organism ID, Bacteria Escherichia coli (A)  Final    Comment: Greater than 100,000 colony forming units per mL Cefazolin <=4 ug/mL Cefazolin with an MIC <=16 predicts susceptibility to the oral agents cefaclor, cefdinir, cefpodoxime, cefprozil, cefuroxime, cephalexin, and loracarbef when used for therapy of uncomplicated urinary tract infections due to E. coli, Klebsiella pneumoniae, and Proteus mirabilis.    ANTIMICROBIAL SUSCEPTIBILITY Comment  Final    Comment:       ** S = Susceptible; I = Intermediate; R = Resistant **                    P = Positive; N = Negative             MICS are expressed in micrograms per mL    Antibiotic                 RSLT#1    RSLT#2    RSLT#3    RSLT#4 Amoxicillin/Clavulanic Acid    S Ampicillin                     S Cefepime                       S Ceftriaxone                    S Cefuroxime                      S Ciprofloxacin                  S Ertapenem                      S Gentamicin                     S Imipenem                       S Levofloxacin                   S Meropenem                      S Nitrofurantoin                 I Piperacillin/Tazobactam  S Tetracycline                   S Tobramycin                     S Trimethoprim/Sulfa             S     Coagulation Studies: Recent Labs    10/04/17 1750  LABPROT 12.6  INR 0.95    Urinalysis:  Recent Labs  Lab 10/05/17 0430  COLORURINE YELLOW*  LABSPEC 1.038*  PHURINE 6.0  GLUCOSEU NEGATIVE  HGBUR SMALL*  BILIRUBINUR NEGATIVE  KETONESUR NEGATIVE  PROTEINUR NEGATIVE  NITRITE NEGATIVE  LEUKOCYTESUR NEGATIVE    Lipid Panel:     Component Value Date/Time   CHOL (H) 07/22/2010 0740    222        ATP III CLASSIFICATION:  <200     mg/dL   Desirable  200-239  mg/dL   Borderline High  >=240    mg/dL   High          TRIG 388 (H) 07/22/2010 0740   HDL 37 (L) 07/22/2010 0740   CHOLHDL 6.0 07/22/2010 0740   VLDL 78 (H) 07/22/2010 0740   LDLCALC (H) 07/22/2010 0740    107        Total Cholesterol/HDL:CHD Risk Coronary Heart Disease Risk Table                     Men   Women  1/2 Average Risk   3.4   3.3  Average Risk       5.0   4.4  2 X Average Risk   9.6   7.1  3 X Average Risk  23.4   11.0        Use the calculated Patient Ratio above and the CHD Risk Table to determine the patient's CHD Risk.        ATP III CLASSIFICATION (LDL):  <100     mg/dL   Optimal  100-129  mg/dL   Near or Above                    Optimal  130-159  mg/dL   Borderline  160-189  mg/dL   High  >190     mg/dL   Very High    HgbA1C:  Lab Results  Component Value Date   HGBA1C 5.2 10/04/2017    Urine Drug Screen:      Component Value Date/Time   LABOPIA NEGATIVE 07/22/2010 0332   LABOPIA NONE DETECTED 07/14/2010 2109   COCAINSCRNUR NEGATIVE 07/22/2010 0332   LABBENZ (A) 07/22/2010 0332     POSITIVE (NOTE) Result repeated and verified. Sent for confirmatory testing   AMPHETMU NEGATIVE 07/22/2010 0332   [place] DETECTED 07/14/2010 2109   THCU NONE DETECTED 07/14/2010 2109   LABBARB  07/14/2010 2109    NONE DETECTED        DRUG SCREEN FOR MEDICAL PURPOSES ONLY.  IF CONFIRMATION IS NEEDED FOR ANY PURPOSE, NOTIFY LAB WITHIN 5 DAYS.        LOWEST DETECTABLE LIMITS FOR URINE DRUG SCREEN Drug Class       Cutoff (ng/mL) Amphetamine      1000 Barbiturate      200 Benzodiazepine   469 Tricyclics       629 Opiates          300 Cocaine          300  THC              50    Alcohol Level: No results for input(s): ETH in the last 168 hours.  Other results: EKG: normal sinus rhythm at 70 bpm.  Imaging: Ct Angio Head W Or Wo Contrast  Result Date: 10/04/2017 CLINICAL DATA:  Initial evaluation for increased falls, stuttering speech. EXAM: CT ANGIOGRAPHY HEAD AND NECK TECHNIQUE: Multidetector CT imaging of the head and neck was performed using the standard protocol during bolus administration of intravenous contrast. Multiplanar CT image reconstructions and MIPs were obtained to evaluate the vascular anatomy. Carotid stenosis measurements (when applicable) are obtained utilizing NASCET criteria, using the distal internal carotid diameter as the denominator. CONTRAST:  2mL ISOVUE-370 IOPAMIDOL (ISOVUE-370) INJECTION 76% COMPARISON:  Prior CTA from 08/08/2017. FINDINGS: CTA NECK FINDINGS Aortic arch: Visualized aortic arch of normal caliber with normal branch pattern. Minimal plaque within the arch itself without significant stenosis about the origin of the great vessels. Visualized subclavian arteries widely patent. Right carotid system: Right common carotid artery widely patent from its origin to the bifurcation. No significant atheromatous narrowing about the right carotid bifurcation. Right ICA patent from the bifurcation to the skull base without stenosis, dissection, or occlusion.  Left carotid system: Left common carotid artery patent from its origin to the bifurcation without stenosis. Mild mixed plaque about the left bifurcation without significant stenosis. Left ICA widely patent distally without stenosis, dissection, or occlusion. Vertebral arteries: Left vertebral artery arises separately from the aortic arch. Right vertebral artery arises from the right subclavian artery. Vertebral arteries are largely code dominant. Vertebral arteries widely patent within the neck without stenosis, dissection, or occlusion. Skeleton: No acute osseous abnormality. No worrisome lytic or blastic osseous lesions. Mild cervical spondylolysis at C6-7. Other neck: No acute soft tissue abnormality within the neck. Thyroid normal. Salivary glands normal. No significant adenopathy. Upper chest: Visualized upper chest within normal limits. Visualized lungs are clear. Review of the MIP images confirms the above findings CTA HEAD FINDINGS Anterior circulation: Petrous segments widely patent bilaterally. Calcified plaque at the anterior genu of the cavernous right ICA without significant stenosis. Scattered atheromatous plaque about the cavernous and supraclinoid left ICA without high-grade stenosis, stable from previous. 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 No residual aneurysm identified. ICA termini widely patent bilaterally. A1 segments and anterior communicating artery widely patent bilaterally. Mild to moderate atheromatous irregularity within the ACA is bilaterally, greater on the left, which do remain patent to their distal aspects. M1 segments patent bilaterally without high-grade stenosis. Mild atheromatous irregularity about the left MCA bifurcation without high-grade stenosis. No proximal M2 occlusion distal MCA branches well perfused and symmetric with mild atheromatous irregularity. Posterior circulation: Vertebral arteries widely patent to the vertebrobasilar junction without  stenosis. Patent right PICA. Dominant left AICA. Basilar artery widely patent to its distal aspect without stenosis. Superior cerebral arteries patent bilaterally. Posterior cerebral arteries primarily supplied via the basilar. PCAs demonstrate mild atheromatous irregularity but are widely patent to their distal aspects without hemodynamically significant stenosis. Small/hypoplastic bilateral posterior communicating arteries noted. Venous sinuses: Patent. Anatomic variants: None. Delayed phase: No abnormal enhancement. Review of the MIP images confirms the above findings IMPRESSION: CTA OF THE NECK: 1. Negative CTA of the neck with no large vessel occlusion or hemodynamically significant stenosis. 2. Minor atheromatous change about the aortic arch and left carotid bifurcation without stenosis. CTA OF THE HEAD 1. Prior supraclinoid right ICA aneurysm clipping without complicating features. No residual aneurysm.  2. Mild intracranial atherosclerotic change involving the anterior and posterior circulation, most notable at the distal left ACA. No high-grade proximal or correctable stenosis identified. Overall, appearance is stable relative to recent CTA from 08/08/2017. 3. No other acute intracranial abnormality. Electronically Signed   By: Jeannine Boga M.D.   On: 10/04/2017 19:48   Ct Head Wo Contrast  Result Date: 10/04/2017 CLINICAL DATA:  Initial evaluation for increase falls with stuttering speech, blurry vision. EXAM: CT HEAD WITHOUT CONTRAST TECHNIQUE: Contiguous axial images were obtained from the base of the skull through the vertex without intravenous contrast. COMPARISON:  Prior CT from 08/08/2017. FINDINGS: Brain: Mild age-related cerebral atrophy with chronic small vessel ischemic disease. No acute intracranial hemorrhage. No acute large vessel territory infarct. No mass lesion, midline shift or mass effect. No hydrocephalus. No extra-axial fluid collection. Vascular: Sequelae of prior surgical  clipping of supraclinoid right ICA aneurysm. No hyperdense vessel. Skull: Prior right pterional craniotomy for surgical clipping of aneurysm. Mild plagiocephaly. No acute skull fracture. Scalp soft tissues demonstrate no acute abnormality. Sinuses/Orbits: Globes and orbital soft tissues within normal limits. No significant paranasal sinus disease. Mastoid air cells are clear. Other: None. IMPRESSION: 1. No acute intracranial abnormality. 2. 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 Electronically Signed   By: Jeannine Boga M.D.   On: 10/04/2017 17:02   Ct Angio Neck W And/or Wo Contrast  Result Date: 10/04/2017 CLINICAL DATA:  Initial evaluation for increased falls, stuttering speech. EXAM: CT ANGIOGRAPHY HEAD AND NECK TECHNIQUE: Multidetector CT imaging of the head and neck was performed using the standard protocol during bolus administration of intravenous contrast. Multiplanar CT image reconstructions and MIPs were obtained to evaluate the vascular anatomy. Carotid stenosis measurements (when applicable) are obtained utilizing NASCET criteria, using the distal internal carotid diameter as the denominator. CONTRAST:  40mL ISOVUE-370 IOPAMIDOL (ISOVUE-370) INJECTION 76% COMPARISON:  Prior CTA from 08/08/2017. FINDINGS: CTA NECK FINDINGS Aortic arch: Visualized aortic arch of normal caliber with normal branch pattern. Minimal plaque within the arch itself without significant stenosis about the origin of the great vessels. Visualized subclavian arteries widely patent. Right carotid system: Right common carotid artery widely patent from its origin to the bifurcation. No significant atheromatous narrowing about the right carotid bifurcation. Right ICA patent from the bifurcation to the skull base without stenosis, dissection, or occlusion. Left carotid system: Left common carotid artery patent from its origin to the bifurcation without stenosis. Mild mixed plaque about the left  bifurcation without significant stenosis. Left ICA widely patent distally without stenosis, dissection, or occlusion. Vertebral arteries: Left vertebral artery arises separately from the aortic arch. Right vertebral artery arises from the right subclavian artery. Vertebral arteries are largely code dominant. Vertebral arteries widely patent within the neck without stenosis, dissection, or occlusion. Skeleton: No acute osseous abnormality. No worrisome lytic or blastic osseous lesions. Mild cervical spondylolysis at C6-7. Other neck: No acute soft tissue abnormality within the neck. Thyroid normal. Salivary glands normal. No significant adenopathy. Upper chest: Visualized upper chest within normal limits. Visualized lungs are clear. Review of the MIP images confirms the above findings CTA HEAD FINDINGS Anterior circulation: Petrous segments widely patent bilaterally. Calcified plaque at the anterior genu of the cavernous right ICA without significant stenosis. Scattered atheromatous plaque about the cavernous and supraclinoid left ICA without high-grade stenosis, stable from previous. 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 No residual aneurysm identified. ICA termini widely patent bilaterally. A1 segments and anterior communicating artery widely  patent bilaterally. Mild to moderate atheromatous irregularity within the ACA is bilaterally, greater on the left, which do remain patent to their distal aspects. M1 segments patent bilaterally without high-grade stenosis. Mild atheromatous irregularity about the left MCA bifurcation without high-grade stenosis. No proximal M2 occlusion distal MCA branches well perfused and symmetric with mild atheromatous irregularity. Posterior circulation: Vertebral arteries widely patent to the vertebrobasilar junction without stenosis. Patent right PICA. Dominant left AICA. Basilar artery widely patent to its distal aspect without stenosis. Superior cerebral  arteries patent bilaterally. Posterior cerebral arteries primarily supplied via the basilar. PCAs demonstrate mild atheromatous irregularity but are widely patent to their distal aspects without hemodynamically significant stenosis. Small/hypoplastic bilateral posterior communicating arteries noted. Venous sinuses: Patent. Anatomic variants: None. Delayed phase: No abnormal enhancement. Review of the MIP images confirms the above findings IMPRESSION: CTA OF THE NECK: 1. Negative CTA of the neck with no large vessel occlusion or hemodynamically significant stenosis. 2. Minor atheromatous change about the aortic arch and left carotid bifurcation without stenosis. CTA OF THE HEAD 1. Prior supraclinoid right ICA aneurysm clipping without complicating features. No residual aneurysm. 2. Mild intracranial atherosclerotic change involving the anterior and posterior circulation, most notable at the distal left ACA. No high-grade proximal or correctable stenosis identified. Overall, appearance is stable relative to recent CTA from 08/08/2017. 3. No other acute intracranial abnormality. Electronically Signed   By: Jeannine Boga M.D.   On: 10/04/2017 19:48   Mr Brain Wo Contrast  Result Date: 10/04/2017 CLINICAL DATA:  Increasing gait imbalance of falls, stuttering speech, fatigue. History of stroke, brain aneurysm (s/p supraclinoid aneurysm clipping), hypertension, hyperlipidemia, seizures. EXAM: MRI HEAD WITHOUT CONTRAST TECHNIQUE: Multiplanar, multiecho pulse sequences of the brain and surrounding structures were obtained without intravenous contrast. COMPARISON:  CT head October 04, 2017 and MRI of the head July 21, 2010 FINDINGS: Mild motion degraded examination. INTRACRANIAL CONTENTS: No reduced diffusion to suggest acute ischemia. No susceptibility artifact to suggest hemorrhage. Borderline parenchymal brain volume loss for age. Patchy supratentorial white matter FLAIR T2 hyperintensities are unchanged no  suspicious parenchymal signal, masses, mass effect. No abnormal extra-axial fluid collections. No extra-axial masses. VASCULAR: Susceptibility artifact associated with RIGHT ICA aneurysm clip and RIGHT craniotomy. SKULL AND UPPER CERVICAL SPINE: No abnormal sellar expansion. No suspicious calvarial bone marrow signal. S post RIGHT craniotomy. Craniocervical junction maintained. SINUSES/ORBITS: The mastoid air-cells and included paranasal sinuses are well-aerated.The included ocular globes and orbital contents are non-suspicious. OTHER: None. IMPRESSION: 1. No acute intracranial process and mildly motion degraded examination. 2. Status post ICA aneurysm clipping. 3. Stable borderline parenchymal brain volume loss for age and mild chronic small vessel ischemic changes. Electronically Signed   By: Elon Alas M.D.   On: 10/04/2017 21:55     Assessment/Plan: 57 year old female presenting with difficulty with gait.  Patient reports some improvement today but continued difficulty with gait.  MRI of the brain reviewed and shows no acute changes.  Aneurysm clipping noted with no evidence of hemorrhage.  CTA unremarkable.  Doubt symptoms related to cerebrovascular disease.  Patient noted to be orthostatic.  This is likely etiology.    Recommendations: 1. Agree with continued addressing of orthostasis.  No further neurologic intervention is recommended at this time.  If further questions arise, please call or page at that time.  Thank you for allowing neurology to participate in the care of this patient.   Alexis Goodell, MD Neurology [phone] 10/05/2017, 1:08 PM

## 2017-10-05 NOTE — Consult Note (Signed)
Cephas Darby, MD [address]  [place]  [place], [place] [address]  Main: [phone]  Fax: [phone] Pager: [phone]   Consultation  Referring Provider:     No ref. provider found Primary Care Physician:  Donnie Coffin, MD Primary Gastroenterologist:  Dr. Gustavo Lah         Reason for Consultation:     Dyspepsia, early satiety  Date of Admission:  10/04/2017 Date of Consultation:  10/05/2017         HPI:   Robin Jensen is a 57 y.o. female with multiple comorbidities as listed below, admitted with ataxia.she has history of cerebral aneurysm repair.she is admitted with ataxia and neurology is consulted who  a secondary to orthostasis and not related to cerebrovascular accident or aneurysm. GI is consulted for dyspepsia, early satiety and poor by mouth intake. She was seen by Dr. Marton Redwood PA in 07/2017 secondary to epigastric pain, nausea and vomiting despite being on Protonix 40 twice a day and Carafate. She underwent upper endoscopy on 09/28/2017 by Dr. Gustavo Lah and was found to have gastritis confirmed H. Pylori on IHC staining. Patient has not been started on H. Pylori regimen yet.   NSAIDs: none  Antiplts/Anticoagulants/Anti thrombotics: none  GI Procedures:  EGD 09/28/2017 - Z-line variable. Biopsied. - Small hiatal hernia. - Gastritis. Biopsied. - Erosive gastritis. Biopsied. DIAGNOSIS:  A. STOMACH, ANTRUM; COLD BIOPSY:  - ANTRAL MUCOSA WITH REACTIVE GASTRITIS.  - NEGATIVE FOR H. PYLORI, DYSPLASIA, AND MALIGNANCY.   B. STOMACH, BODY; COLD BIOPSY:  - OXYNTIC TYPE MUCOSA WITH MODERATE CHRONIC ACTIVE GASTRITIS AND  GLANDULAR DROPOUT.  - IHC FOR H. PYLORI TO BE REPORTED IN AN ADDENDUM.  - NEGATIVE FOR INTESTINAL METAPLASIA, DYSPLASIA, AND MALIGNANCY.   C. GE JUNCTION; COLD BIOPSY:  - REFLUX GASTROESOPHAGITIS.  - NEGATIVE FOR DYSPLASIA AND MALIGNANCY.  An IHC stain for H. pylori was performed in the biopsy of the stomach  body (specimen B)  and is positive for focal Helicobacter organisms.  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   Past Medical History:  Diagnosis Date  . 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 Anxiety   . Bipolar 1 disorder ([facility])   . Cancer ([facility])    uterine  . Depression   . Dry mouth   . DVT of leg (deep venous thrombosis) ([facility])    history of  . Easy bruising   . Eye dryness   . Fatigue   . GERD (gastroesophageal reflux disease)   . Hyperlipidemia   . Hypertension   . Hypothyroid   . Migraine   . Migraine   . Seizure disorder ([facility])   . Seizures ([facility])    last year last seizure-had brain aneurysm. ON meds  . Stroke ([facility]) 1997   No deficits  . Suicidal ideation    attempted overdoses in past    Past Surgical History:  Procedure Laterality Date  . ABDOMINAL HYSTERECTOMY    . CEREBRAL ANEURYSM REPAIR  1997  . CESAREAN SECTION    . CHOLECYSTECTOMY    . COLONOSCOPY WITH PROPOFOL N/A 08/20/2012   Procedure: COLONOSCOPY WITH PROPOFOL;  Surgeon: [name], MD;  Location: [facility];  Service: Endoscopy;  Laterality: N/A;  In cecum at [place]; Total Withdrawal Time= 12 minutes  . COLONOSCOPY WITH PROPOFOL N/A 09/28/2017   Procedure: COLONOSCOPY WITH PROPOFOL;  Surgeon: [name], MD;  Location: [facility] ENDOSCOPY;  Service: Endoscopy;  Laterality: N/A;  .  ESOPHAGOGASTRODUODENOSCOPY (EGD) WITH PROPOFOL N/A 11/20/2016   Procedure: ESOPHAGOGASTRODUODENOSCOPY (EGD) WITH PROPOFOL;  Surgeon: [name], MD;  Location: [facility] ENDOSCOPY;  Service: Endoscopy;  Laterality: N/A;  . ESOPHAGOGASTRODUODENOSCOPY (EGD) WITH PROPOFOL N/A 09/28/2017   Procedure: ESOPHAGOGASTRODUODENOSCOPY (EGD) WITH PROPOFOL;  Surgeon: [name], MD;  Location: [facility] ENDOSCOPY;  Service: Endoscopy;  Laterality: N/A;  . RADICAL HYSTERECTOMY      Prior to Admission medications   Medication Sig Start Date End Date Taking? Authorizing Provider  atorvastatin (LIPITOR) 40 MG tablet Take 40 mg by  mouth at bedtime.    Yes [provider]  cyclobenzaprine (FLEXERIL) 10 MG tablet Take 10 mg by mouth every 8 (eight) hours as needed for muscle spasms.    Yes [provider]  diazepam (VALIUM) 5 MG tablet Take 5 mg by mouth at bedtime. 09/20/17  Yes [provider]  donepezil (ARICEPT) 5 MG tablet Take 5 mg by mouth at bedtime.    Yes [provider]  EPINEPHrine 0.3 mg/0.3 mL IJ SOAJ injection Inject 0.3 mg into the muscle as needed. For anaphylactic reaction   Yes [provider]  estradiol (ESTRACE VAGINAL) 0.1 MG/GM vaginal cream Apply 0.'5mg'$  (pea-sized amount)  just inside the vaginal introitus with a finger-tip on Monday, Wednesday and Friday nights. 05/09/17  Yes McGowan, Larene Beach A, PA-C  gabapentin (NEURONTIN) 300 MG capsule Take 300 mg by mouth 3 (three) times daily.   Yes [provider]  labetalol (NORMODYNE) 200 MG tablet Take 600 mg by mouth 2 (two) times daily.   Yes [provider]  lamoTRIgine (LAMICTAL) 200 MG tablet Take 300 mg by mouth daily.    Yes [provider]  levothyroxine (SYNTHROID, LEVOTHROID) 75 MCG tablet Take 1 tablet (75 mcg total) by mouth daily before breakfast. 10/03/12  Yes Oxford, Orson Ape, FNP  losartan (COZAAR) 50 MG tablet Take 1 tablet (50 mg total) by mouth daily. 01/02/14  Yes Minus Breeding, MD  losartan-hydrochlorothiazide (HYZAAR) 100-25 MG per tablet Take 1 tablet by mouth daily. 10/03/12  Yes Lysbeth Penner, FNP  lurasidone (LATUDA) 80 MG TABS tablet Take 80 mg by mouth at bedtime.    Yes [provider]  nortriptyline (PAMELOR) 10 MG capsule Take 20 mg by mouth at bedtime.  06/12/17  Yes [provider]  pantoprazole (PROTONIX) 40 MG tablet Take 40 mg by mouth 2 (two) times daily.   Yes [provider]  phenytoin (DILANTIN) 200 MG ER capsule Take 400 mg by mouth daily.    Yes [provider]  potassium chloride (K-DUR) 10 MEQ tablet Take 10 mEq  by mouth daily. 09/22/17  Yes [provider]  tiZANidine (ZANAFLEX) 2 MG tablet Take 2 mg by mouth 2 (two) times daily. 09/26/17  Yes [provider]  Verapamil HCl CR 300 MG CP24 Take 1 capsule (300 mg total) by mouth daily. Patient taking differently: Take 300 mg by mouth every evening.  10/03/12  Yes Oxford, Orson Ape, FNP  nitrofurantoin, macrocrystal-monohydrate, (MACROBID) 100 MG capsule Take 1 capsule (100 mg total) by mouth every 12 (twelve) hours. Patient not taking: Reported on 10/04/2017 07/02/17   Nori Riis, PA-C    Family History  Problem Relation Age of Onset  . Colon cancer Father        less than age 29  . Colon cancer Paternal Uncle        76s  . Colon cancer Paternal Grandfather   . Breast cancer Maternal Aunt   .  Diabetes Sister   . Arthritis Brother   . Hematuria Mother   . Kidney cancer Maternal Grandmother   . Kidney disease Maternal Grandmother   . Prostate cancer Neg Hx      Social History   Tobacco Use  . Smoking status: Light Tobacco Smoker    Last attempt to quit: 08/15/2005    Years since quitting: 12.1  . Smokeless tobacco: Never Used  Substance Use Topics  . 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 Drug use: No    Comment: H/O marijuana and cocaine, in 1980s    Allergies as of 10/04/2017 - Review Complete 10/04/2017  Allergen Reaction Noted  . Bee venom Anaphylaxis 06/20/2013  . Penicillins Shortness Of Breath and Rash 06/03/2010  . Latex  06/03/2010  . Lisinopril  07/08/2015  . Other  08/18/2015  . Sulfa antibiotics  06/03/2010    Review of Systems:    All systems reviewed and negative except where noted in HPI.   Physical Exam:  Vital signs in last 24 hours: Temp:  [97.4 F (36.3 C)-98.3 F (36.8 C)] 98.3 F (36.8 C) (06/14 1700) Pulse Rate:  [59-82] 82 (06/14 1700) Resp:  [11-20] 18 (06/14 1700) BP: (122-186)/(75-168) 122/75 (06/14 1700) SpO2:  [97 %-100 %] 100 % (06/14 1700) Weight:  [151 lb 12.8  oz (68.9 kg)] 151 lb 12.8 oz (68.9 kg) (06/14 0620)   General:   Pleasant, cooperative in NAD Head:  Normocephalic and atraumatic. Eyes:   No icterus.   Conjunctiva pink. PERRLA. Ears:  Normal auditory acuity. Neck:  Supple; no masses or thyroidomegaly Lungs: Respirations even and unlabored. Lungs clear to auscultation bilaterally.   No wheezes, crackles, or rhonchi.  Heart:  Regular rate and rhythm;  Without murmur, clicks, rubs or gallops Abdomen:  Soft, nondistended, nontender. Normal bowel sounds. No appreciable masses or hepatomegaly.  No rebound or guarding.  Rectal:  Not performed. Msk:  Left leg weakness  Extremities:  Without edema, cyanosis or clubbing. Neurologic:  Alert and oriented x3;  grossly normal neurologically. Skin:  Intact without significant lesions or rashes. Psych:  Alert and cooperative. Normal affect.  LAB RESULTS: CBC Latest Ref Rng & Units 10/04/2017 08/08/2017 04/25/2017  WBC 3.6 - 11.0 K/uL 6.7 6.5 6.4  Hemoglobin 12.0 - 16.0 g/dL 12.2 13.6 13.4  Hematocrit 35.0 - 47.0 % 34.9(L) 40.2 38.4  Platelets 150 - 440 K/uL 208 219 235    BMET BMP Latest Ref Rng & Units 10/04/2017 08/08/2017 04/25/2017  Glucose 65 - 99 mg/dL 102(H) 100(H) 85  BUN 6 - 20 mg/dL '17 19 12  '$ Creatinine 0.44 - 1.00 mg/dL 1.25(H) 1.18(H) 0.93  BUN/Creat Ratio 9 - 23 - - -  Sodium 135 - 145 mmol/L 136 136 139  Potassium 3.5 - 5.1 mmol/L 3.8 4.3 3.6  Chloride 101 - 111 mmol/L 99(L) 99(L) 102  CO2 22 - 32 mmol/L '26 30 29  '$ Calcium 8.9 - 10.3 mg/dL 9.0 8.9 8.9    LFT Hepatic Function Latest Ref Rng & Units 10/04/2017 07/20/2015 07/08/2015  Total Protein 6.5 - 8.1 g/dL 6.7 7.0 6.8  Albumin 3.5 - 5.0 g/dL 3.8 4.0 3.9  AST 15 - 41 U/L 33 31 29  ALT 14 - 54 U/L '20 26 28  '$ Alk Phosphatase 38 - 126 U/L 71 75 67  Total Bilirubin 0.3 - 1.2 mg/dL 0.3 0.7 0.4  Bilirubin, Direct 0.0 - 0.3 mg/dL - - -     STUDIES: Ct  Angio Head W Or Wo Contrast  Result Date: 10/04/2017 CLINICAL DATA:  Initial  evaluation for increased falls, stuttering speech. EXAM: CT ANGIOGRAPHY HEAD AND NECK TECHNIQUE: Multidetector CT imaging of the head and neck was performed using the standard protocol during bolus administration of intravenous contrast. Multiplanar CT image reconstructions and MIPs were obtained to evaluate the vascular anatomy. Carotid stenosis measurements (when applicable) are obtained utilizing NASCET criteria, using the distal internal carotid diameter as the denominator. CONTRAST:  66m ISOVUE-370 IOPAMIDOL (ISOVUE-370) INJECTION 76% COMPARISON:  Prior CTA from 08/08/2017. FINDINGS: CTA NECK FINDINGS Aortic arch: Visualized aortic arch of normal caliber with normal branch pattern. Minimal plaque within the arch itself without significant stenosis about the origin of the great vessels. Visualized subclavian arteries widely patent. Right carotid system: Right common carotid artery widely patent from its origin to the bifurcation. No significant atheromatous narrowing about the right carotid bifurcation. Right ICA patent from the bifurcation to the skull base without stenosis, dissection, or occlusion. Left carotid system: Left common carotid artery patent from its origin to the bifurcation without stenosis. Mild mixed plaque about the left bifurcation without significant stenosis. Left ICA widely patent distally without stenosis, dissection, or occlusion. Vertebral arteries: Left vertebral artery arises separately from the aortic arch. Right vertebral artery arises from the right subclavian artery. Vertebral arteries are largely code dominant. Vertebral arteries widely patent within the neck without stenosis, dissection, or occlusion. Skeleton: No acute osseous abnormality. No worrisome lytic or blastic osseous lesions. Mild cervical spondylolysis at C6-7. Other neck: No acute soft tissue abnormality within the neck. Thyroid normal. Salivary glands normal. No significant adenopathy. Upper chest: Visualized  upper chest within normal limits. Visualized lungs are clear. Review of the MIP images confirms the above findings CTA HEAD FINDINGS Anterior circulation: Petrous segments widely patent bilaterally. Calcified plaque at the anterior genu of the cavernous right ICA without significant stenosis. Scattered atheromatous plaque about the cavernous and supraclinoid left ICA without high-grade stenosis, stable from previous. 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 No residual aneurysm identified. ICA termini widely patent bilaterally. A1 segments and anterior communicating artery widely patent bilaterally. Mild to moderate atheromatous irregularity within the ACA is bilaterally, greater on the left, which do remain patent to their distal aspects. M1 segments patent bilaterally without high-grade stenosis. Mild atheromatous irregularity about the left MCA bifurcation without high-grade stenosis. No proximal M2 occlusion distal MCA branches well perfused and symmetric with mild atheromatous irregularity. Posterior circulation: Vertebral arteries widely patent to the vertebrobasilar junction without stenosis. Patent right PICA. Dominant left AICA. Basilar artery widely patent to its distal aspect without stenosis. Superior cerebral arteries patent bilaterally. Posterior cerebral arteries primarily supplied via the basilar. PCAs demonstrate mild atheromatous irregularity but are widely patent to their distal aspects without hemodynamically significant stenosis. Small/hypoplastic bilateral posterior communicating arteries noted. Venous sinuses: Patent. Anatomic variants: None. Delayed phase: No abnormal enhancement. Review of the MIP images confirms the above findings IMPRESSION: CTA OF THE NECK: 1. Negative CTA of the neck with no large vessel occlusion or hemodynamically significant stenosis. 2. Minor atheromatous change about the aortic arch and left carotid bifurcation without stenosis. CTA OF THE HEAD  1. Prior supraclinoid right ICA aneurysm clipping without complicating features. No residual aneurysm. 2. Mild intracranial atherosclerotic change involving the anterior and posterior circulation, most notable at the distal left ACA. No high-grade proximal or correctable stenosis identified. Overall, appearance is stable relative to recent CTA from 08/08/2017. 3. No other acute intracranial abnormality. Electronically  Signed   By: Jeannine Boga M.D.   On: 10/04/2017 19:48   Ct Head Wo Contrast  Result Date: 10/04/2017 CLINICAL DATA:  Initial evaluation for increase falls with stuttering speech, blurry vision. EXAM: CT HEAD WITHOUT CONTRAST TECHNIQUE: Contiguous axial images were obtained from the base of the skull through the vertex without intravenous contrast. COMPARISON:  Prior CT from 08/08/2017. FINDINGS: Brain: Mild age-related cerebral atrophy with chronic small vessel ischemic disease. No acute intracranial hemorrhage. No acute large vessel territory infarct. No mass lesion, midline shift or mass effect. No hydrocephalus. No extra-axial fluid collection. Vascular: Sequelae of prior surgical clipping of supraclinoid right ICA aneurysm. No hyperdense vessel. Skull: Prior right pterional craniotomy for surgical clipping of aneurysm. Mild plagiocephaly. No acute skull fracture. Scalp soft tissues demonstrate no acute abnormality. Sinuses/Orbits: Globes and orbital soft tissues within normal limits. No significant paranasal sinus disease. Mastoid air cells are clear. Other: None. IMPRESSION: 1. No acute intracranial abnormality. 2. 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 Electronically Signed   By: Jeannine Boga M.D.   On: 10/04/2017 17:02   Ct Angio Neck W And/or Wo Contrast  Result Date: 10/04/2017 CLINICAL DATA:  Initial evaluation for increased falls, stuttering speech. EXAM: CT ANGIOGRAPHY HEAD AND NECK TECHNIQUE: Multidetector CT imaging of the head and  neck was performed using the standard protocol during bolus administration of intravenous contrast. Multiplanar CT image reconstructions and MIPs were obtained to evaluate the vascular anatomy. Carotid stenosis measurements (when applicable) are obtained utilizing NASCET criteria, using the distal internal carotid diameter as the denominator. CONTRAST:  81m ISOVUE-370 IOPAMIDOL (ISOVUE-370) INJECTION 76% COMPARISON:  Prior CTA from 08/08/2017. FINDINGS: CTA NECK FINDINGS Aortic arch: Visualized aortic arch of normal caliber with normal branch pattern. Minimal plaque within the arch itself without significant stenosis about the origin of the great vessels. Visualized subclavian arteries widely patent. Right carotid system: Right common carotid artery widely patent from its origin to the bifurcation. No significant atheromatous narrowing about the right carotid bifurcation. Right ICA patent from the bifurcation to the skull base without stenosis, dissection, or occlusion. Left carotid system: Left common carotid artery patent from its origin to the bifurcation without stenosis. Mild mixed plaque about the left bifurcation without significant stenosis. Left ICA widely patent distally without stenosis, dissection, or occlusion. Vertebral arteries: Left vertebral artery arises separately from the aortic arch. Right vertebral artery arises from the right subclavian artery. Vertebral arteries are largely code dominant. Vertebral arteries widely patent within the neck without stenosis, dissection, or occlusion. Skeleton: No acute osseous abnormality. No worrisome lytic or blastic osseous lesions. Mild cervical spondylolysis at C6-7. Other neck: No acute soft tissue abnormality within the neck. Thyroid normal. Salivary glands normal. No significant adenopathy. Upper chest: Visualized upper chest within normal limits. Visualized lungs are clear. Review of the MIP images confirms the above findings CTA HEAD FINDINGS Anterior  circulation: Petrous segments widely patent bilaterally. Calcified plaque at the anterior genu of the cavernous right ICA without significant stenosis. Scattered atheromatous plaque about the cavernous and supraclinoid left ICA without high-grade stenosis, stable from previous. 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 No residual aneurysm identified. ICA termini widely patent bilaterally. A1 segments and anterior communicating artery widely patent bilaterally. Mild to moderate atheromatous irregularity within the ACA is bilaterally, greater on the left, which do remain patent to their distal aspects. M1 segments patent bilaterally without high-grade stenosis. Mild atheromatous irregularity about the left MCA bifurcation without high-grade stenosis.  No proximal M2 occlusion distal MCA branches well perfused and symmetric with mild atheromatous irregularity. Posterior circulation: Vertebral arteries widely patent to the vertebrobasilar junction without stenosis. Patent right PICA. Dominant left AICA. Basilar artery widely patent to its distal aspect without stenosis. Superior cerebral arteries patent bilaterally. Posterior cerebral arteries primarily supplied via the basilar. PCAs demonstrate mild atheromatous irregularity but are widely patent to their distal aspects without hemodynamically significant stenosis. Small/hypoplastic bilateral posterior communicating arteries noted. Venous sinuses: Patent. Anatomic variants: None. Delayed phase: No abnormal enhancement. Review of the MIP images confirms the above findings IMPRESSION: CTA OF THE NECK: 1. Negative CTA of the neck with no large vessel occlusion or hemodynamically significant stenosis. 2. Minor atheromatous change about the aortic arch and left carotid bifurcation without stenosis. CTA OF THE HEAD 1. Prior supraclinoid right ICA aneurysm clipping without complicating features. No residual aneurysm. 2. Mild intracranial atherosclerotic  change involving the anterior and posterior circulation, most notable at the distal left ACA. No high-grade proximal or correctable stenosis identified. Overall, appearance is stable relative to recent CTA from 08/08/2017. 3. No other acute intracranial abnormality. Electronically Signed   By: Jeannine Boga M.D.   On: 10/04/2017 19:48   Mr Brain Wo Contrast  Result Date: 10/04/2017 CLINICAL DATA:  Increasing gait imbalance of falls, stuttering speech, fatigue. History of stroke, brain aneurysm (s/p supraclinoid aneurysm clipping), hypertension, hyperlipidemia, seizures. EXAM: MRI HEAD WITHOUT CONTRAST TECHNIQUE: Multiplanar, multiecho pulse sequences of the brain and surrounding structures were obtained without intravenous contrast. COMPARISON:  CT head October 04, 2017 and MRI of the head July 21, 2010 FINDINGS: Mild motion degraded examination. INTRACRANIAL CONTENTS: No reduced diffusion to suggest acute ischemia. No susceptibility artifact to suggest hemorrhage. Borderline parenchymal brain volume loss for age. Patchy supratentorial white matter FLAIR T2 hyperintensities are unchanged no suspicious parenchymal signal, masses, mass effect. No abnormal extra-axial fluid collections. No extra-axial masses. VASCULAR: Susceptibility artifact associated with RIGHT ICA aneurysm clip and RIGHT craniotomy. SKULL AND UPPER CERVICAL SPINE: No abnormal sellar expansion. No suspicious calvarial bone marrow signal. S post RIGHT craniotomy. Craniocervical junction maintained. SINUSES/ORBITS: The mastoid air-cells and included paranasal sinuses are well-aerated.The included ocular globes and orbital contents are non-suspicious. OTHER: None. IMPRESSION: 1. No acute intracranial process and mildly motion degraded examination. 2. Status post ICA aneurysm clipping. 3. Stable borderline parenchymal brain volume loss for age and mild chronic small vessel ischemic changes. Electronically Signed   By: Elon Alas M.D.    On: 10/04/2017 21:55      Impression / Plan:   Robin LOUVIER is a 57 y.o. female with cerebrovascular accident secondary to brain aneurysm, admitted with ataxia and GI is consulted for dyspepsia, nausea and vomiting, poor by mouth intake due to early satiety. She recently had upper endoscopy last week which revealed H. Pylori gastritis and not treated yet. This is the etiology of her symptoms.  - Recommend Protonix 40 mg, clarithromycin 500 mg, metronidazole 500 mg twice daily for 14 days total as patient is allergic to penicillins  - confirm H. Pylori eradication after completion of therapy with H. Pylori breath test or stool antigen testing or repeat EGD with biopsies off proton pump inhibitors. - Check Ferritin, iron & TIBC, B12, folate - F/u with Dr Gustavo Lah after discharge  Thank you for involving me in the care of this patient. GI will sign off for now    LOS: 0 days   Sherri Sear, MD  10/05/2017, 7:15 PM   Note: This  dictation was prepared with Dragon dictation along with smaller phrase technology. Any transcriptional errors that result from this process are unintentional.

## 2017-10-05 NOTE — Plan of Care (Signed)
  Problem: Education: Goal: Knowledge of disease or condition will improve Outcome: Progressing Goal: Knowledge of secondary prevention will improve Outcome: Progressing Goal: Knowledge of patient specific risk factors addressed and post discharge goals established will improve Outcome: Progressing   Problem: Coping: Goal: Will verbalize positive feelings about self Outcome: Progressing   Problem: Health Behavior/Discharge Planning: Goal: Ability to manage health-related needs will improve Outcome: Progressing   Problem: Self-Care: Goal: Ability to participate in self-care as condition permits will improve Outcome: Progressing   Problem: Nutrition: Goal: Risk of aspiration will decrease Outcome: Progressing   Problem: Ischemic Stroke/TIA Tissue Perfusion: Goal: Complications of ischemic stroke/TIA will be minimized Outcome: Progressing   Problem: Education: Goal: Knowledge of General Education information will improve Outcome: Progressing   Problem: Health Behavior/Discharge Planning: Goal: Ability to manage health-related needs will improve Outcome: Progressing   Problem: Clinical Measurements: Goal: Ability to maintain clinical measurements within normal limits will improve Outcome: Progressing Goal: Respiratory complications will improve Outcome: Progressing   Problem: Activity: Goal: Risk for activity intolerance will decrease Outcome: Progressing   Problem: Nutrition: Goal: Adequate nutrition will be maintained Outcome: Progressing   Problem: Coping: Goal: Level of anxiety will decrease Outcome: Progressing   Problem: Elimination: Goal: Will not experience complications related to bowel motility Outcome: Progressing   Problem: Pain Managment: Goal: General experience of comfort will improve Outcome: Progressing

## 2017-10-05 NOTE — Progress Notes (Signed)
[facility] NAME: Robin Jensen    MR#:  [id number]  DATE OF BIRTH:  26-May-1960  SUBJECTIVE:  CHIEF COMPLAINT:  No chief complaint on file. Patient without complaint, states that she has these periods of problems with mobility/ambulation off and on for the last year, MRI noted, physical therapy input appreciated, neurology to see, speech therapy to see, noted orthostasis on testing and discussion with nursing staff-we will adjust medications accordingly  REVIEW OF SYSTEMS:  CONSTITUTIONAL: No fever, fatigue or weakness.  EYES: No blurred or double vision.  EARS, NOSE, AND THROAT: No tinnitus or ear pain.  RESPIRATORY: No cough, shortness of breath, wheezing or hemoptysis.  CARDIOVASCULAR: No chest pain, orthopnea, edema.  GASTROINTESTINAL: No nausea, vomiting, diarrhea or abdominal pain.  GENITOURINARY: No dysuria, hematuria.  ENDOCRINE: No polyuria, nocturia,  HEMATOLOGY: No anemia, easy bruising or bleeding SKIN: No rash or lesion. MUSCULOSKELETAL: No joint pain or arthritis.   NEUROLOGIC: No tingling, numbness, weakness.  PSYCHIATRY: No anxiety or depression.   ROS  DRUG ALLERGIES:   Allergies  Allergen Reactions  . Bee Venom Anaphylaxis  . Penicillins Shortness Of Breath and Rash  . Latex   . Lisinopril   . Other   . Sulfa Antibiotics     VITALS:  Blood pressure (!) 122/97, pulse 76, temperature 98.1 F (36.7 C), temperature source Oral, resp. rate 18, height 5\' 1"  (1.549 m), weight 68.9 kg (151 lb 12.8 oz), SpO2 97 %.  PHYSICAL EXAMINATION:  GENERAL:  57 y.o.-year-old patient lying in the bed with no acute distress.  EYES: Pupils equal, round, reactive to light and accommodation. No scleral icterus. Extraocular muscles intact.  HEENT: Head atraumatic, normocephalic. Oropharynx and nasopharynx clear.  NECK:  Supple, no jugular venous distention. No thyroid enlargement, no tenderness.  LUNGS: Normal breath sounds  bilaterally, no wheezing, rales,rhonchi or crepitation. No use of accessory muscles of respiration.  CARDIOVASCULAR: S1, S2 normal. No murmurs, rubs, or gallops.  ABDOMEN: Soft, nontender, nondistended. Bowel sounds present. No organomegaly or mass.  EXTREMITIES: No pedal edema, cyanosis, or clubbing.  NEUROLOGIC: Cranial nerves II through XII are intact. Muscle strength 5/5 in all extremities. Sensation intact. Gait not checked.  PSYCHIATRIC: The patient is alert and oriented x 3.  SKIN: No obvious rash, lesion, or ulcer.   Physical Exam LABORATORY PANEL:   CBC Recent Labs  Lab 10/04/17 1621  WBC 6.7  HGB 12.2  HCT 34.9*  PLT 208   ------------------------------------------------------------------------------------------------------------------  Chemistries  Recent Labs  Lab 10/04/17 1621  NA 136  K 3.8  CL 99*  CO2 26  GLUCOSE 102*  BUN 17  CREATININE 1.25*  CALCIUM 9.0  AST 33  ALT 20  ALKPHOS 71  BILITOT 0.3   ------------------------------------------------------------------------------------------------------------------  Cardiac Enzymes Recent Labs  Lab 10/04/17 1621 10/05/17 0329  TROPONINI <0.03 <0.03   ------------------------------------------------------------------------------------------------------------------  RADIOLOGY:  Ct Angio Head W Or Wo Contrast  Result Date: 10/04/2017 CLINICAL DATA:  Initial evaluation for increased falls, stuttering speech. EXAM: CT ANGIOGRAPHY HEAD AND NECK TECHNIQUE: Multidetector CT imaging of the head and neck was performed using the standard protocol during bolus administration of intravenous contrast. Multiplanar CT image reconstructions and MIPs were obtained to evaluate the vascular anatomy. Carotid stenosis measurements (when applicable) are obtained utilizing NASCET criteria, using the distal internal carotid diameter as the denominator. CONTRAST:  56mL ISOVUE-370 IOPAMIDOL (ISOVUE-370) INJECTION 76% COMPARISON:   Prior CTA from 08/08/2017. FINDINGS: CTA NECK FINDINGS Aortic arch:  Visualized aortic arch of normal caliber with normal branch pattern. Minimal plaque within the arch itself without significant stenosis about the origin of the great vessels. Visualized subclavian arteries widely patent. Right carotid system: Right common carotid artery widely patent from its origin to the bifurcation. No significant atheromatous narrowing about the right carotid bifurcation. Right ICA patent from the bifurcation to the skull base without stenosis, dissection, or occlusion. Left carotid system: Left common carotid artery patent from its origin to the bifurcation without stenosis. Mild mixed plaque about the left bifurcation without significant stenosis. Left ICA widely patent distally without stenosis, dissection, or occlusion. Vertebral arteries: Left vertebral artery arises separately from the aortic arch. Right vertebral artery arises from the right subclavian artery. Vertebral arteries are largely code dominant. Vertebral arteries widely patent within the neck without stenosis, dissection, or occlusion. Skeleton: No acute osseous abnormality. No worrisome lytic or blastic osseous lesions. Mild cervical spondylolysis at C6-7. Other neck: No acute soft tissue abnormality within the neck. Thyroid normal. Salivary glands normal. No significant adenopathy. Upper chest: Visualized upper chest within normal limits. Visualized lungs are clear. Review of the MIP images confirms the above findings CTA HEAD FINDINGS Anterior circulation: Petrous segments widely patent bilaterally. Calcified plaque at the anterior genu of the cavernous right ICA without significant stenosis. Scattered atheromatous plaque about the cavernous and supraclinoid left ICA without high-grade stenosis, stable from previous. 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 No residual aneurysm identified. ICA termini widely patent bilaterally. A1  segments and anterior communicating artery widely patent bilaterally. Mild to moderate atheromatous irregularity within the ACA is bilaterally, greater on the left, which do remain patent to their distal aspects. M1 segments patent bilaterally without high-grade stenosis. Mild atheromatous irregularity about the left MCA bifurcation without high-grade stenosis. No proximal M2 occlusion distal MCA branches well perfused and symmetric with mild atheromatous irregularity. Posterior circulation: Vertebral arteries widely patent to the vertebrobasilar junction without stenosis. Patent right PICA. Dominant left AICA. Basilar artery widely patent to its distal aspect without stenosis. Superior cerebral arteries patent bilaterally. Posterior cerebral arteries primarily supplied via the basilar. PCAs demonstrate mild atheromatous irregularity but are widely patent to their distal aspects without hemodynamically significant stenosis. Small/hypoplastic bilateral posterior communicating arteries noted. Venous sinuses: Patent. Anatomic variants: None. Delayed phase: No abnormal enhancement. Review of the MIP images confirms the above findings IMPRESSION: CTA OF THE NECK: 1. Negative CTA of the neck with no large vessel occlusion or hemodynamically significant stenosis. 2. Minor atheromatous change about the aortic arch and left carotid bifurcation without stenosis. CTA OF THE HEAD 1. Prior supraclinoid right ICA aneurysm clipping without complicating features. No residual aneurysm. 2. Mild intracranial atherosclerotic change involving the anterior and posterior circulation, most notable at the distal left ACA. No high-grade proximal or correctable stenosis identified. Overall, appearance is stable relative to recent CTA from 08/08/2017. 3. No other acute intracranial abnormality. Electronically Signed   By: Jeannine Boga M.D.   On: 10/04/2017 19:48   Ct Head Wo Contrast  Result Date: 10/04/2017 CLINICAL DATA:  Initial  evaluation for increase falls with stuttering speech, blurry vision. EXAM: CT HEAD WITHOUT CONTRAST TECHNIQUE: Contiguous axial images were obtained from the base of the skull through the vertex without intravenous contrast. COMPARISON:  Prior CT from 08/08/2017. FINDINGS: Brain: Mild age-related cerebral atrophy with chronic small vessel ischemic disease. No acute intracranial hemorrhage. No acute large vessel territory infarct. No mass lesion, midline shift or mass effect. No hydrocephalus. No extra-axial fluid  collection. Vascular: Sequelae of prior surgical clipping of supraclinoid right ICA aneurysm. No hyperdense vessel. Skull: Prior right pterional craniotomy for surgical clipping of aneurysm. Mild plagiocephaly. No acute skull fracture. Scalp soft tissues demonstrate no acute abnormality. Sinuses/Orbits: Globes and orbital soft tissues within normal limits. No significant paranasal sinus disease. Mastoid air cells are clear. Other: None. IMPRESSION: 1. No acute intracranial abnormality. 2. 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 Electronically Signed   By: Jeannine Boga M.D.   On: 10/04/2017 17:02   Ct Angio Neck W And/or Wo Contrast  Result Date: 10/04/2017 CLINICAL DATA:  Initial evaluation for increased falls, stuttering speech. EXAM: CT ANGIOGRAPHY HEAD AND NECK TECHNIQUE: Multidetector CT imaging of the head and neck was performed using the standard protocol during bolus administration of intravenous contrast. Multiplanar CT image reconstructions and MIPs were obtained to evaluate the vascular anatomy. Carotid stenosis measurements (when applicable) are obtained utilizing NASCET criteria, using the distal internal carotid diameter as the denominator. CONTRAST:  14mL ISOVUE-370 IOPAMIDOL (ISOVUE-370) INJECTION 76% COMPARISON:  Prior CTA from 08/08/2017. FINDINGS: CTA NECK FINDINGS Aortic arch: Visualized aortic arch of normal caliber with normal branch pattern.  Minimal plaque within the arch itself without significant stenosis about the origin of the great vessels. Visualized subclavian arteries widely patent. Right carotid system: Right common carotid artery widely patent from its origin to the bifurcation. No significant atheromatous narrowing about the right carotid bifurcation. Right ICA patent from the bifurcation to the skull base without stenosis, dissection, or occlusion. Left carotid system: Left common carotid artery patent from its origin to the bifurcation without stenosis. Mild mixed plaque about the left bifurcation without significant stenosis. Left ICA widely patent distally without stenosis, dissection, or occlusion. Vertebral arteries: Left vertebral artery arises separately from the aortic arch. Right vertebral artery arises from the right subclavian artery. Vertebral arteries are largely code dominant. Vertebral arteries widely patent within the neck without stenosis, dissection, or occlusion. Skeleton: No acute osseous abnormality. No worrisome lytic or blastic osseous lesions. Mild cervical spondylolysis at C6-7. Other neck: No acute soft tissue abnormality within the neck. Thyroid normal. Salivary glands normal. No significant adenopathy. Upper chest: Visualized upper chest within normal limits. Visualized lungs are clear. Review of the MIP images confirms the above findings CTA HEAD FINDINGS Anterior circulation: Petrous segments widely patent bilaterally. Calcified plaque at the anterior genu of the cavernous right ICA without significant stenosis. Scattered atheromatous plaque about the cavernous and supraclinoid left ICA without high-grade stenosis, stable from previous. 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 No residual aneurysm identified. ICA termini widely patent bilaterally. A1 segments and anterior communicating artery widely patent bilaterally. Mild to moderate atheromatous irregularity within the ACA is bilaterally,  greater on the left, which do remain patent to their distal aspects. M1 segments patent bilaterally without high-grade stenosis. Mild atheromatous irregularity about the left MCA bifurcation without high-grade stenosis. No proximal M2 occlusion distal MCA branches well perfused and symmetric with mild atheromatous irregularity. Posterior circulation: Vertebral arteries widely patent to the vertebrobasilar junction without stenosis. Patent right PICA. Dominant left AICA. Basilar artery widely patent to its distal aspect without stenosis. Superior cerebral arteries patent bilaterally. Posterior cerebral arteries primarily supplied via the basilar. PCAs demonstrate mild atheromatous irregularity but are widely patent to their distal aspects without hemodynamically significant stenosis. Small/hypoplastic bilateral posterior communicating arteries noted. Venous sinuses: Patent. Anatomic variants: None. Delayed phase: No abnormal enhancement. Review of the MIP images confirms the above findings IMPRESSION:  CTA OF THE NECK: 1. Negative CTA of the neck with no large vessel occlusion or hemodynamically significant stenosis. 2. Minor atheromatous change about the aortic arch and left carotid bifurcation without stenosis. CTA OF THE HEAD 1. Prior supraclinoid right ICA aneurysm clipping without complicating features. No residual aneurysm. 2. Mild intracranial atherosclerotic change involving the anterior and posterior circulation, most notable at the distal left ACA. No high-grade proximal or correctable stenosis identified. Overall, appearance is stable relative to recent CTA from 08/08/2017. 3. No other acute intracranial abnormality. Electronically Signed   By: Jeannine Boga M.D.   On: 10/04/2017 19:48   Mr Brain Wo Contrast  Result Date: 10/04/2017 CLINICAL DATA:  Increasing gait imbalance of falls, stuttering speech, fatigue. History of stroke, brain aneurysm (s/p supraclinoid aneurysm clipping), hypertension,  hyperlipidemia, seizures. EXAM: MRI HEAD WITHOUT CONTRAST TECHNIQUE: Multiplanar, multiecho pulse sequences of the brain and surrounding structures were obtained without intravenous contrast. COMPARISON:  CT head October 04, 2017 and MRI of the head July 21, 2010 FINDINGS: Mild motion degraded examination. INTRACRANIAL CONTENTS: No reduced diffusion to suggest acute ischemia. No susceptibility artifact to suggest hemorrhage. Borderline parenchymal brain volume loss for age. Patchy supratentorial white matter FLAIR T2 hyperintensities are unchanged no suspicious parenchymal signal, masses, mass effect. No abnormal extra-axial fluid collections. No extra-axial masses. VASCULAR: Susceptibility artifact associated with RIGHT ICA aneurysm clip and RIGHT craniotomy. SKULL AND UPPER CERVICAL SPINE: No abnormal sellar expansion. No suspicious calvarial bone marrow signal. S post RIGHT craniotomy. Craniocervical junction maintained. SINUSES/ORBITS: The mastoid air-cells and included paranasal sinuses are well-aerated.The included ocular globes and orbital contents are non-suspicious. OTHER: None. IMPRESSION: 1. No acute intracranial process and mildly motion degraded examination. 2. Status post ICA aneurysm clipping. 3. Stable borderline parenchymal brain volume loss for age and mild chronic small vessel ischemic changes. Electronically Signed   By: Elon Alas M.D.   On: 10/04/2017 21:55    ASSESSMENT AND PLAN:  This is a 57 year old female admitted for imbalance  *Acute imbalance/ataxia Most likely secondary to orthostatic hypotension-positive vitals noted on October 05, 2017 MRI negative for acute process, physical therapy recommending outpatient physical therapy status post discharge, speech therapy to see given her failed swallow assessment in the emergency room, neurology to see, will decrease beta-blocker therapy as well as losartan, check orthostatic vitals every a.m., fall precautions while in  house  *Chronic benign essential hypertension Given orthostasis, will decrease beta-blocker therapy/losartan, continue all other agents, PRN hydralazine, vitals per routine, make changes as per necessary  *History of seizure disorder, unspecified Stable Continue Dilantin, lamotrigine, seizure precautions  *Chronic hypothyroidism Stable Continue Synthroid  *Chronic Mood disorder Stable Continue home regiment  *Chronic muscular spasms Stable Continue muscle relaxants    Disposition Home in 1 to 2 days barring any complications  All the records are reviewed and case discussed with Care Management/Social Workerr. Management plans discussed with the patient, family and they are in agreement.  CODE STATUS: full  TOTAL TIME TAKING CARE OF THIS PATIENT: 35 minutes.     POSSIBLE D/C IN 1-2 DAYS, DEPENDING ON CLINICAL CONDITION.   Avel Peace Salary M.D on 10/05/2017   Between 7am to 6pm - Pager - [phone]  After 6pm go to [email or web address] - password [email or web address]  Office  [phone]  CC: Primary care physician; Donnie Coffin, MD  Note: This dictation was prepared with Dragon dictation along with smaller phrase technology. Any transcriptional errors that result from this process are unintentional.

## 2017-10-05 NOTE — Evaluation (Signed)
Physical Therapy Evaluation Patient Details Name: KAMALI SAKATA MRN: [id number] DOB: Oct 19, 1960 Today's Date: 10/05/2017   History of Present Illness  Patient is a 57 year old female admitted for ataxia following a series of falls at home.  PMH includes storke, seizures, CA, aneurysm, anxiety and depression.  Clinical Impression  Patient is a 57 year old female who lives in a one story home with her fiance.  She reports being independent at baseline without use of AD but has a RW at home to use when needed.  Pt is in bed upon PT arrival and able to perform bed mobility mod I as well as sit at EOB with supervision.  Pt presents with generalized overall weakness, L more limited than R.  She reoprts L LE pain along line of IT band with TTP and pain in neck and shoulders with BUE abduction.  Pt is able to perform STS without physical assist but requires close CGA on L side due to unsteadiness on feet.  Pt relies on RW for balance.  She is able to ambulate 8 ft in room to recliner, close CGA and presents with difficulty in advancing L LE.  LE coordination testing revealed no significant deficits.  Pt feels this is related to weakness vs motor planning.  She is interested in continuing to work with therapy and will benefit from skilled PT with focus on strength, functional mobility, balance and fall prevention, use of AD and pain management.    Follow Up Recommendations Outpatient PT    Equipment Recommendations  None recommended by PT    Recommendations for Other Services       Precautions / Restrictions Precautions Precautions: Fall Precaution Comments: High fall risk-recent falls Restrictions Weight Bearing Restrictions: No      Mobility  Bed Mobility Overal bed mobility: Modified Independent             General bed mobility comments: Increased time and use of bed rail with HOB elevated.  Transfers Overall transfer level: Needs assistance Equipment used: Rolling walker (2  wheeled) Transfers: Sit to/from Stand Sit to Stand: Min guard         General transfer comment: Able to stand without physical assist, close CGA provided on L side.  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  Stairs            Wheelchair Mobility    Modified Rankin (Stroke Patients Only)       Balance Overall balance assessment: Needs assistance Sitting-balance support: Feet supported Sitting balance-Leahy Scale: Good     Standing balance support: Bilateral upper extremity supported Standing balance-Leahy Scale: Poor               High level balance activites: Side stepping;Backward walking;Turns High Level Balance Comments: Dependent on RW, slows to turn and perform backward walking, able to negotiate RW without VC's.             Pertinent Vitals/Pain Pain Assessment: Faces Faces Pain Scale: Hurts little more Pain Location: L LE along IT band (sharp). Neck and shoulders with shoulder abduction. Pain Descriptors / Indicators: Aching;Sharp Pain  Intervention(s): Limited activity within patient's tolerance;Monitored during session    [place] expects to be discharged to:: Private residence Living Arrangements: Spouse/significant other Available Help at Discharge: Family;Available 24 hours/day Type of Home: House Home Access: Stairs to enter Entrance Stairs-Rails: Can reach both Entrance Stairs-Number of Steps: 3 Home Layout: One level Home Equipment: Walker - 2 wheels;Grab bars - tub/shower      Prior Function Level of  Independence: Needs assistance   Gait / Transfers Assistance Needed: Has and aide who assists with bathing, does not drive.  ADL's / Homemaking Assistance Needed: Aide and fiance assist with daily activity.        Hand Dominance        Extremity/Trunk Assessment   Upper Extremity Assessment Upper Extremity Assessment: Generalized weakness(L: 3+/5, R: 4-/5)    Lower Extremity Assessment Lower Extremity Assessment: Generalized weakness(L: 3+/5, R: 4/5; No N/t reported.)    Cervical / Trunk Assessment Cervical / Trunk Assessment: Normal  Communication   Communication: No difficulties  Cognition Arousal/Alertness: Awake/alert Behavior During Therapy: WFL for tasks assessed/performed Overall Cognitive Status: Within Functional Limits for tasks assessed                                 General Comments: A&O x4.  Follows commands consistently.      General Comments General comments (skin integrity, edema, etc.): Coordination: Heel to shin: L: 9 sec, R: 7 sec.    Exercises Other Exercises Other Exercises: Educated concerning plan following Dc from hospital including PT, HEP and following up with specialist. Other Exercises: Educated concerning relationship between immobility and L side pain and effect on mobility as well as scoliosis related to LLD.  Pt states that she has also been dx with scoliosis.   Assessment/Plan    PT Assessment Patient needs continued PT services  PT Problem List Decreased strength;Decreased mobility;Decreased balance;Pain;Decreased activity tolerance       PT Treatment Interventions DME instruction;Therapeutic activities;Gait training;Therapeutic exercise;Cognitive remediation;Patient/family education;Stair training;Balance training;Functional mobility training;Neuromuscular re-education    PT Goals (Current goals can be found in the Care Plan section)  Acute Rehab PT Goals Patient Stated Goal: To continue with PT to regain  strength. PT Goal Formulation: With patient Time For Goal Achievement: 10/19/17 Potential to Achieve Goals: Good    Frequency Min 2X/week   Barriers to discharge        Co-evaluation               AM-PAC PT "6 Clicks" Daily Activity  Outcome Measure Difficulty turning over in bed (including adjusting bedclothes, sheets and blankets)?: A Little Difficulty moving from lying on back to sitting on the side of the bed? : A Little Difficulty sitting down on and standing up from a chair with arms (e.g., wheelchair, bedside commode, etc,.)?: A Little Help needed moving to and from a bed to chair (including a wheelchair)?: A Lot Help needed walking in hospital room?: A Lot Help needed climbing 3-5 steps with a railing? : A Lot 6 Click Score: 15    End of Session Equipment Utilized During Treatment: Gait belt Activity Tolerance: Patient tolerated treatment well Patient left: in chair;with chair alarm set;with call bell/phone within reach Nurse Communication: Mobility status PT Visit Diagnosis: Unsteadiness on feet (R26.81);Muscle weakness (generalized) (M62.81)    Time: 0626-9485 PT Time Calculation (min) (ACUTE ONLY): 30 min   Charges:   PT Evaluation $PT Eval Moderate Complexity: 1  Mod PT Treatments $Therapeutic Activity: 8-22 mins   PT G Codes:   PT G-Codes **NOT FOR INPATIENT CLASS** Functional Assessment Tool Used: AM-PAC 6 Clicks Basic Mobility    Roxanne Gates, PT, DPT   Roxanne Gates 10/05/2017, 11:51 AM

## 2017-10-05 NOTE — Care Management Obs Status (Signed)
[place] NOTIFICATION   Patient Details  Name: Robin Jensen MRN: [id number] Date of Birth: 06-17-1960   Medicare Observation Status Notification Given:  Yes    Shelbie Ammons, RN 10/05/2017, 9:06 AM

## 2017-10-05 NOTE — Evaluation (Signed)
Clinical/Bedside Swallow Evaluation Patient Details  Name: Robin Jensen MRN: [id number] Date of Birth: Oct 22, 1960  Today's Date: 10/05/2017 Time: SLP Start Time (ACUTE ONLY): 1430 SLP Stop Time (ACUTE ONLY): 1530 SLP Time Calculation (min) (ACUTE ONLY): 60 min  Past Medical History:  Past Medical History:  Diagnosis Date  . 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 Anxiety   . Bipolar 1 disorder ([place])   . Cancer ([place])    uterine  . Depression   . Dry mouth   . DVT of leg (deep venous thrombosis) ([facility])    history of  . Easy bruising   . Eye dryness   . Fatigue   . GERD (gastroesophageal reflux disease)   . Hyperlipidemia   . Hypertension   . Hypothyroid   . Migraine   . Migraine   . Seizure disorder ([place])   . Seizures ([place])    last year last seizure-had brain aneurysm. ON meds  . Stroke ([place]) 1997   No deficits  . Suicidal ideation    attempted overdoses in past   Past Surgical History:  Past Surgical History:  Procedure Laterality Date  . ABDOMINAL HYSTERECTOMY    . CEREBRAL ANEURYSM REPAIR  1997  . CESAREAN SECTION    . CHOLECYSTECTOMY    . COLONOSCOPY WITH PROPOFOL N/A 08/20/2012   Procedure: COLONOSCOPY WITH PROPOFOL;  Surgeon: [name], MD;  Location: [facility];  Service: Endoscopy;  Laterality: N/A;  In cecum at [place]; Total Withdrawal Time= 12 minutes  . COLONOSCOPY WITH PROPOFOL N/A 09/28/2017   Procedure: COLONOSCOPY WITH PROPOFOL;  Surgeon: [name], MD;  Location: [facility] ENDOSCOPY;  Service: Endoscopy;  Laterality: N/A;  . ESOPHAGOGASTRODUODENOSCOPY (EGD) WITH PROPOFOL N/A 11/20/2016   Procedure: ESOPHAGOGASTRODUODENOSCOPY (EGD) WITH PROPOFOL;  Surgeon: [name], MD;  Location: [facility] ENDOSCOPY;  Service: Endoscopy;  Laterality: N/A;  . ESOPHAGOGASTRODUODENOSCOPY (EGD) WITH PROPOFOL N/A 09/28/2017   Procedure: ESOPHAGOGASTRODUODENOSCOPY (EGD) WITH PROPOFOL;  Surgeon: [name], MD;  Location: [facility]COPY;  Service: Endoscopy;   Laterality: N/A;  . RADICAL HYSTERECTOMY     HPI:  Pt is a 57 y.o. female w/ PMH including past medical history of cerebral aneurysm status post clipping, uterine cancer, cerebrovascular accident, hypertension and hypothyroidism who reports onset of dizziness, difficulty walking/standing, feeling lightheaded as if she might lose consciousness but she did not actually faint, and slurred speech yesterday.  She says she had this before and was told it was a UTI.  She can give me no other complaints except for that she had pain in her head and neck yesterday. Pain was fairly severe. Pt stated she recently took "Goody powders again" for her neck pain although she is not suppose to take it. Pt has a PMH also of Bipolar Dis., suicidal ideations, GERD. She is also followed by GI w/ reduced gastric emptying and poor Esophageal motility. This is documented by GI in recent visits in April and June of 2019 - "erosive gastritis, hiatal hernia". Pt stated she "coughed all the time when I eat meals" and "sometimes have to throw up" - she indicated this has been ongoing for "years". Pt's speech and motor movements have improved since yesterday she stated; pt conversed in conversation w/ SLP, A/O x3.    Assessment / Plan / Recommendation Clinical Impression  Pt appeared to present w/ adequate oropharyngeal phase swallow function w/ reduced risk for aspiration from an Oropharyngeal standpoint when following general aspiration precautions. However, pt has a  dx of Esophageal DYSMOTILITY and GERD resulting in episodes of coughing and regurgitation when eating/drinking (meals). Pt stated this has been ongoing for "years". Pt A/O x3; needed min more upright positioning in bed for the po trials. Pt then held Cup and fed self small, single sips of thin liquids slowly - few multiple sips. NO immediate, overt s/s of aspiration noted; no decline in respiratory status or decline in vocal quality noted during/post trials. Pt then  consumed trials of puree and soft solids w/ similar presentation - WFL from an apparent oropharyngeal phase standpoint but intermittent, much delayed coughing noted after the trials. Pt stated this was "usual" for her. Oral phase was [facility] for bolus management and oral clearing w/ all trials. Pt fed self. OM exam was [facility].  Recommend a regular consistency, SOFT GI diet w/ general aspiration precautions; STRICT Reflux precautions. Recommend Pills given Whole in puree, liquid form for best Esophageal clearing. Pt is at risk for ESOPHAGEAL REGURGITATION and aspiration of such food/liquid material thus impacting the airway and Pulmonary status w/ her degree of Esophageal dysmotility. Education and instruction given to pt. No further skilled ST Services indicated currently as pt appears at her baseline. NSG to reconsult if any change in status while admitted. Pt agreed. Recommend GI f/u for further management and direction. Of note, pt has been seen by GI recently w/ dx of erosive gastritis; on PPI BID. Recommend f/u w/ Dietician for nutritional support. SLP Visit Diagnosis: Dysphagia, pharyngoesophageal phase (R13.14);Dysphagia, unspecified (R13.10)(Esophageal phase dysmotility/dysphagia)    Aspiration Risk  Mild aspiration risk(from an Esophageal phase standpoint; Regurgitation)    Diet Recommendation  Regular diet (GI SOFT per MD); Thin liquids.  STRICT Reflux precautions; general aspiration precautions.  Medication Administration: Whole meds with puree(for improved motility hopefully)    Other  Recommendations Recommended Consults: Consider GI evaluation;Consider esophageal assessment(continued f/u) Oral Care Recommendations: Oral care BID;Patient independent with oral care Other Recommendations: Dietician f/u   Follow up Recommendations None  Appears at her baseline(GI)    Frequency and Duration (n/a)  (n/a)       Prognosis Prognosis for Safe Diet Advancement: Fair Barriers to Reach Goals: (GI  dysmotility)      Swallow Study   General Date of Onset: 10/04/17 HPI: Pt is a 57 y.o. female w/ PMH including past medical history of cerebral aneurysm status post clipping, uterine cancer, cerebrovascular accident, hypertension and hypothyroidism who reports onset of dizziness, difficulty walking/standing, feeling lightheaded as if she might lose consciousness but she did not actually faint, and slurred speech yesterday.  She says she had this before and was told it was a UTI.  She can give me no other complaints except for that she had pain in her head and neck yesterday. Pain was fairly severe. Pt stated she recently took "Goody powders again" for her neck pain although she is not suppose to take it. Pt has a PMH also of Bipolar Dis., suicidal ideations, GERD. She is also followed by GI w/ reduced gastric emptying and poor Esophageal motility. This is documented by GI in recent visits in April and June of 2019 - "erosive gastritis, hiatal hernia". Pt stated she "coughed all the time when I eat meals" and "sometimes have to throw up" - she indicated this has been ongoing for "years". Pt's speech and motor movements have improved since yesterday she stated; pt conversed in conversation w/ SLP, A/O x3.  Type of Study: Bedside Swallow Evaluation Previous Swallow Assessment: GI f/u Diet Prior to  this Study: NPO(Regular diet w/ thin liquids at home) Temperature Spikes Noted: No(wbc 6.7) Respiratory Status: Room air History of Recent Intubation: No Behavior/Cognition: Alert;Cooperative;Pleasant mood Oral Cavity Assessment: Within Functional Limits;Dry(min) Oral Care Completed by SLP: Recent completion by staff Oral Cavity - Dentition: Adequate natural dentition;Missing dentition(few) Vision: Functional for self-feeding Self-Feeding Abilities: Able to feed self;Needs set up(positioning more upright) Patient Positioning: Upright in bed Baseline Vocal Quality: Normal Volitional Cough:  Strong Volitional Swallow: Able to elicit    Oral/Motor/Sensory Function Overall Oral Motor/Sensory Function: Within functional limits   Ice Chips Ice chips: Within functional limits Presentation: Spoon(fed; 2 trials)   Thin Liquid Thin Liquid: Within functional limits Presentation: Cup;Self Fed(~3-4 ozs total) Other Comments: WFL from an apparent oropharyngeal phase standpoint - much delayed coughing noted    Nectar Thick Nectar Thick Liquid: Not tested   Honey Thick Honey Thick Liquid: Not tested   Puree Puree: Within functional limits Presentation: Spoon;Self Fed(3-4 trials ) Other Comments: WFL from an apparent oropharyngeal phase standpoint - much delayed coughing noted   Solid   GO   Solid: Within functional limits Presentation: Self Fed(3 trials)         Orinda Kenner, MS, CCC-SLP Aashna Matson 10/05/2017,4:53 PM

## 2017-10-05 NOTE — Plan of Care (Signed)
Rounded with MD, patient resting in room comfortably. Dr. Jerelyn Charles made aware of +orthostatic vitals.  Problem: Education: Goal: Knowledge of disease or condition will improve Outcome: Progressing   Problem: Health Behavior/Discharge Planning: Goal: Ability to manage health-related needs will improve Outcome: Progressing   Problem: Nutrition: Goal: Risk of aspiration will decrease Outcome: Progressing   Problem: Ischemic Stroke/TIA Tissue Perfusion: Goal: Complications of ischemic stroke/TIA will be minimized Outcome: Progressing

## 2017-10-06 DIAGNOSIS — R270 Ataxia, unspecified: Secondary | ICD-10-CM | POA: Diagnosis not present

## 2017-10-06 LAB — VITAMIN B12: Vitamin B-12: 1020 pg/mL — ABNORMAL HIGH (ref 180–914)

## 2017-10-06 MED ORDER — CLARITHROMYCIN 500 MG PO TABS: 500.0000 mg | ORAL_TABLET | Freq: Two times a day (BID) | ORAL | 0 refills | Status: DC

## 2017-10-06 MED ORDER — PANTOPRAZOLE SODIUM 40 MG PO TBEC: 40.0000 mg | DELAYED_RELEASE_TABLET | Freq: Two times a day (BID) | ORAL | 0 refills | Status: AC

## 2017-10-06 MED ORDER — LABETALOL HCL 300 MG PO TABS: 300.0000 mg | ORAL_TABLET | Freq: Two times a day (BID) | ORAL | 0 refills | Status: DC

## 2017-10-06 MED ORDER — METRONIDAZOLE 500 MG PO TABS: 500.0000 mg | ORAL_TABLET | Freq: Two times a day (BID) | ORAL | 0 refills | Status: DC

## 2017-10-06 NOTE — Discharge Summary (Signed)
[facility] [place] NAME: Robin Jensen    MR#:  [id number]  DATE OF BIRTH:  Apr 21, 1961  DATE OF ADMISSION:  10/04/2017 ADMITTING PHYSICIAN: Harrie Foreman, MD  DATE OF DISCHARGE: No discharge date for patient encounter.  PRIMARY CARE PHYSICIAN: Aycock, Edmonia Lynch, MD    ADMISSION DIAGNOSIS:  Ataxia [R27.0] Unable to walk [R26.2]  DISCHARGE DIAGNOSIS:  Active Problems:   Ataxia   SECONDARY DIAGNOSIS:   Past Medical History:  Diagnosis Date  . 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 Anxiety   . Bipolar 1 disorder ([place])   . Cancer ([place])    uterine  . Depression   . Dry mouth   . DVT of leg (deep venous thrombosis) ([facility])    history of  . Easy bruising   . Eye dryness   . Fatigue   . GERD (gastroesophageal reflux disease)   . Hyperlipidemia   . Hypertension   . Hypothyroid   . Migraine   . Migraine   . Seizure disorder ([place])   . Seizures ([place])    last year last seizure-had brain aneurysm. ON meds  . Stroke ([place]) 1997   No deficits  . Suicidal ideation    attempted overdoses in past    HOSPITAL COURSE:  This is a 57 year old female admitted for imbalance  *Acute imbalance/ataxia resolved Most likely secondary to orthostatic hypotension-positive vitals noted on October 05, 2017 MRI negative for acute process, neurology did see patient while in house, beta-blocker therapy as well as losartan were decreased, and patient did well  *Chronic benign essential hypertension Stable Beta-blocker therapy and losartan were decreased, continue all other antihypertensive agents   *History of seizure disorder, unspecified Stable Continued Dilantin, lamotrigine, and was placed on seizure precautions  *Chronic hypothyroidism Stable Continued Synthroid  *ChronicMood disorder Stable Continued home regiment  *Chronic muscular spasms Stable Continued muscle relaxants   DISCHARGE CONDITIONS:    stable  CONSULTS OBTAINED:  Treatment Team:  Alexis Goodell, MD  DRUG ALLERGIES:   Allergies  Allergen Reactions  . Bee Venom Anaphylaxis  . Penicillins Shortness Of Breath and Rash  . Latex   . Lisinopril   . Other   . Sulfa Antibiotics     DISCHARGE MEDICATIONS:   Allergies as of 10/06/2017      Reactions   Bee Venom Anaphylaxis   Penicillins Shortness Of Breath, Rash   Latex    Lisinopril    Other    Sulfa Antibiotics       Medication List    STOP taking these medications   losartan 50 MG tablet Commonly known as:  COZAAR     TAKE these medications   atorvastatin 40 MG tablet Commonly known as:  LIPITOR Take 40 mg by mouth at bedtime.   clarithromycin 500 MG tablet Commonly known as:  BIAXIN Take 1 tablet (500 mg total) by mouth every 12 (twelve) hours.   cyclobenzaprine 10 MG tablet Commonly known as:  FLEXERIL Take 10 mg by mouth every 8 (eight) hours as needed for muscle spasms.   diazepam 5 MG tablet Commonly known as:  VALIUM Take 5 mg by mouth at bedtime.   donepezil 5 MG tablet Commonly known as:  ARICEPT Take 5 mg by mouth at bedtime.   EPINEPHrine 0.3 mg/0.3 mL Soaj injection Commonly known as:  EPI-PEN Inject 0.3 mg into the muscle as needed. For anaphylactic reaction   estradiol 0.1 MG/GM vaginal  cream Commonly known as:  ESTRACE VAGINAL Apply 0.5mg  (pea-sized amount)  just inside the vaginal introitus with a finger-tip on Monday, Wednesday and Friday nights.   gabapentin 300 MG capsule Commonly known as:  NEURONTIN Take 300 mg by mouth 3 (three) times daily.   labetalol 300 MG tablet Commonly known as:  NORMODYNE Take 1 tablet (300 mg total) by mouth 2 (two) times daily. What changed:    medication strength  how much to take   lamoTRIgine 200 MG tablet Commonly known as:  LAMICTAL Take 300 mg by mouth daily.   levothyroxine 75 MCG tablet Commonly known as:  SYNTHROID, LEVOTHROID Take 1 tablet (75 mcg total) by  mouth daily before breakfast.   losartan-hydrochlorothiazide 100-25 MG tablet Commonly known as:  HYZAAR Take 1 tablet by mouth daily.   lurasidone 80 MG Tabs tablet Commonly known as:  LATUDA Take 80 mg by mouth at bedtime.   metroNIDAZOLE 500 MG tablet Commonly known as:  FLAGYL Take 1 tablet (500 mg total) by mouth every 12 (twelve) hours.   nitrofurantoin (macrocrystal-monohydrate) 100 MG capsule Commonly known as:  MACROBID Take 1 capsule (100 mg total) by mouth every 12 (twelve) hours.   nortriptyline 10 MG capsule Commonly known as:  PAMELOR Take 20 mg by mouth at bedtime.   pantoprazole 40 MG tablet Commonly known as:  PROTONIX Take 1 tablet (40 mg total) by mouth 2 (two) times daily.   phenytoin 200 MG ER capsule Commonly known as:  DILANTIN Take 400 mg by mouth daily.   potassium chloride 10 MEQ tablet Commonly known as:  K-DUR Take 10 mEq by mouth daily.   tiZANidine 2 MG tablet Commonly known as:  ZANAFLEX Take 2 mg by mouth 2 (two) times daily.   Verapamil HCl CR 300 MG Cp24 Take 1 capsule (300 mg total) by mouth daily. What changed:  when to take this        DISCHARGE INSTRUCTIONS:   If you experience worsening of your admission symptoms, develop shortness of breath, life threatening emergency, suicidal or homicidal thoughts you must seek medical attention immediately by calling 911 or calling your MD immediately  if symptoms less severe.  You Must read complete instructions/literature along with all the possible adverse reactions/side effects for all the Medicines you take and that have been prescribed to you. Take any new Medicines after you have completely understood and accept all the possible adverse reactions/side effects.   Please note  You were cared for by a hospitalist during your hospital stay. If you have any questions about your discharge medications or the care you received while you were in the hospital after you are discharged, you  can call the unit and asked to speak with the hospitalist on call if the hospitalist that took care of you is not available. Once you are discharged, your primary care physician will handle any further medical issues. Please note that NO REFILLS for any discharge medications will be authorized once you are discharged, as it is imperative that you return to your primary care physician (or establish a relationship with a primary care physician if you do not have one) for your aftercare needs so that they can reassess your need for medications and monitor your lab values.    Today   CHIEF COMPLAINT:  No chief complaint on file.   HISTORY OF PRESENT ILLNESS:   patient with past medical history of cerebral aneurysm status post clipping, urine cancer, cerebrovascular accident, hypertension and hypothyroidism  presents to the emergency department after a series of falls.  The patient states that her symptoms began yesterday when she felt as if she would wobble anterior to posterior when trying to stand and walk.  She admits to feeling lightheaded as if she might lose consciousness but she did not actually faint.  Today she had similar symptoms and collapsed sideways onto the side of her vehicle.  She did not lose consciousness with this episode either.  In the emergency department CT and MRI of her brain showed stable clipped aneurysm and no hemorrhage or ischemic areas.  Physical exam per emergency physician revealed ataxia to the left as well as imbalance with forward and backward upright positioning which prompted the emergency department staff to call the hospitalist service for admission.    VITAL SIGNS:  Blood pressure (!) 146/86, pulse 73, temperature 97.8 F (36.6 C), temperature source Oral, resp. rate 18, height 5\' 1"  (1.549 m), weight 113.3 kg (249 lb 11.2 oz), SpO2 100 %.  I/O:    Intake/Output Summary (Last 24 hours) at 10/06/2017 1032 Last data filed at 10/05/2017 2300 Gross per 24 hour   Intake 50 ml  Output 1000 ml  Net -950 ml    PHYSICAL EXAMINATION:  GENERAL:  57 y.o.-year-old patient lying in the bed with no acute distress.  EYES: Pupils equal, round, reactive to light and accommodation. No scleral icterus. Extraocular muscles intact.  HEENT: Head atraumatic, normocephalic. Oropharynx and nasopharynx clear.  NECK:  Supple, no jugular venous distention. No thyroid enlargement, no tenderness.  LUNGS: Normal breath sounds bilaterally, no wheezing, rales,rhonchi or crepitation. No use of accessory muscles of respiration.  CARDIOVASCULAR: S1, S2 normal. No murmurs, rubs, or gallops.  ABDOMEN: Soft, non-tender, non-distended. Bowel sounds present. No organomegaly or mass.  EXTREMITIES: No pedal edema, cyanosis, or clubbing.  NEUROLOGIC: Cranial nerves II through XII are intact. Muscle strength 5/5 in all extremities. Sensation intact. Gait not checked.  PSYCHIATRIC: The patient is alert and oriented x 3.  SKIN: No obvious rash, lesion, or ulcer.   DATA REVIEW:   CBC Recent Labs  Lab 10/04/17 1621  WBC 6.7  HGB 12.2  HCT 34.9*  PLT 208    Chemistries  Recent Labs  Lab 10/04/17 1621  NA 136  K 3.8  CL 99*  CO2 26  GLUCOSE 102*  BUN 17  CREATININE 1.25*  CALCIUM 9.0  AST 33  ALT 20  ALKPHOS 71  BILITOT 0.3    Cardiac Enzymes Recent Labs  Lab 10/05/17 0329  TROPONINI <0.03    Microbiology Results  Results for orders placed or performed in visit on 06/20/17  Microscopic Examination     Status: Abnormal   Collection Time: 06/20/17  1:28 PM  Result Value Ref Range Status   WBC, UA >30 (H) 0 - 5 /hpf Final   RBC, UA 3-10 (A) 0 - 2 /hpf Final   Epithelial Cells (non renal) >10 (H) 0 - 10 /hpf Final   Mucus, UA Present (A) Not Estab. Final   Bacteria, UA Many (A) None seen/Few Final  CULTURE, URINE COMPREHENSIVE     Status: Abnormal   Collection Time: 06/20/17  2:05 PM  Result Value Ref Range Status   Urine Culture, Comprehensive Final  report (A)  Final   Organism ID, Bacteria Escherichia coli (A)  Final    Comment: Greater than 100,000 colony forming units per mL Cefazolin <=4 ug/mL Cefazolin with an MIC <=16 predicts susceptibility to the oral agents  cefaclor, cefdinir, cefpodoxime, cefprozil, cefuroxime, cephalexin, and loracarbef when used for therapy of uncomplicated urinary tract infections due to E. coli, Klebsiella pneumoniae, and Proteus mirabilis.    ANTIMICROBIAL SUSCEPTIBILITY Comment  Final    Comment:       ** S = Susceptible; I = Intermediate; R = Resistant **                    P = Positive; N = Negative             MICS are expressed in micrograms per mL    Antibiotic                 RSLT#1    RSLT#2    RSLT#3    RSLT#4 Amoxicillin/Clavulanic Acid    S Ampicillin                     S Cefepime                       S Ceftriaxone                    S Cefuroxime                     S Ciprofloxacin                  S Ertapenem                      S Gentamicin                     S Imipenem                       S Levofloxacin                   S Meropenem                      S Nitrofurantoin                 I Piperacillin/Tazobactam        S Tetracycline                   S Tobramycin                     S Trimethoprim/Sulfa             S     RADIOLOGY:  Ct Angio Head W Or Wo Contrast  Result Date: 10/04/2017 CLINICAL DATA:  Initial evaluation for increased falls, stuttering speech. EXAM: CT ANGIOGRAPHY HEAD AND NECK TECHNIQUE: Multidetector CT imaging of the head and neck was performed using the standard protocol during bolus administration of intravenous contrast. Multiplanar CT image reconstructions and MIPs were obtained to evaluate the vascular anatomy. Carotid stenosis measurements (when applicable) are obtained utilizing NASCET criteria, using the distal internal carotid diameter as the denominator. CONTRAST:  60mL ISOVUE-370 IOPAMIDOL (ISOVUE-370) INJECTION 76% COMPARISON:  Prior CTA  from 08/08/2017. FINDINGS: CTA NECK FINDINGS Aortic arch: Visualized aortic arch of normal caliber with normal branch pattern. Minimal plaque within the arch itself without significant stenosis about the origin of the great vessels. Visualized subclavian arteries widely patent. Right carotid system: Right common carotid artery widely patent from its origin to the bifurcation. No significant atheromatous narrowing about the  right carotid bifurcation. Right ICA patent from the bifurcation to the skull base without stenosis, dissection, or occlusion. Left carotid system: Left common carotid artery patent from its origin to the bifurcation without stenosis. Mild mixed plaque about the left bifurcation without significant stenosis. Left ICA widely patent distally without stenosis, dissection, or occlusion. Vertebral arteries: Left vertebral artery arises separately from the aortic arch. Right vertebral artery arises from the right subclavian artery. Vertebral arteries are largely code dominant. Vertebral arteries widely patent within the neck without stenosis, dissection, or occlusion. Skeleton: No acute osseous abnormality. No worrisome lytic or blastic osseous lesions. Mild cervical spondylolysis at C6-7. Other neck: No acute soft tissue abnormality within the neck. Thyroid normal. Salivary glands normal. No significant adenopathy. Upper chest: Visualized upper chest within normal limits. Visualized lungs are clear. Review of the MIP images confirms the above findings CTA HEAD FINDINGS Anterior circulation: Petrous segments widely patent bilaterally. Calcified plaque at the anterior genu of the cavernous right ICA without significant stenosis. Scattered atheromatous plaque about the cavernous and supraclinoid left ICA without high-grade stenosis, stable from previous. 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 No residual aneurysm identified. ICA termini widely patent bilaterally. A1 segments and  anterior communicating artery widely patent bilaterally. Mild to moderate atheromatous irregularity within the ACA is bilaterally, greater on the left, which do remain patent to their distal aspects. M1 segments patent bilaterally without high-grade stenosis. Mild atheromatous irregularity about the left MCA bifurcation without high-grade stenosis. No proximal M2 occlusion distal MCA branches well perfused and symmetric with mild atheromatous irregularity. Posterior circulation: Vertebral arteries widely patent to the vertebrobasilar junction without stenosis. Patent right PICA. Dominant left AICA. Basilar artery widely patent to its distal aspect without stenosis. Superior cerebral arteries patent bilaterally. Posterior cerebral arteries primarily supplied via the basilar. PCAs demonstrate mild atheromatous irregularity but are widely patent to their distal aspects without hemodynamically significant stenosis. Small/hypoplastic bilateral posterior communicating arteries noted. Venous sinuses: Patent. Anatomic variants: None. Delayed phase: No abnormal enhancement. Review of the MIP images confirms the above findings IMPRESSION: CTA OF THE NECK: 1. Negative CTA of the neck with no large vessel occlusion or hemodynamically significant stenosis. 2. Minor atheromatous change about the aortic arch and left carotid bifurcation without stenosis. CTA OF THE HEAD 1. Prior supraclinoid right ICA aneurysm clipping without complicating features. No residual aneurysm. 2. Mild intracranial atherosclerotic change involving the anterior and posterior circulation, most notable at the distal left ACA. No high-grade proximal or correctable stenosis identified. Overall, appearance is stable relative to recent CTA from 08/08/2017. 3. No other acute intracranial abnormality. Electronically Signed   By: Jeannine Boga M.D.   On: 10/04/2017 19:48   Ct Head Wo Contrast  Result Date: 10/04/2017 CLINICAL DATA:  Initial evaluation  for increase falls with stuttering speech, blurry vision. EXAM: CT HEAD WITHOUT CONTRAST TECHNIQUE: Contiguous axial images were obtained from the base of the skull through the vertex without intravenous contrast. COMPARISON:  Prior CT from 08/08/2017. FINDINGS: Brain: Mild age-related cerebral atrophy with chronic small vessel ischemic disease. No acute intracranial hemorrhage. No acute large vessel territory infarct. No mass lesion, midline shift or mass effect. No hydrocephalus. No extra-axial fluid collection. Vascular: Sequelae of prior surgical clipping of supraclinoid right ICA aneurysm. No hyperdense vessel. Skull: Prior right pterional craniotomy for surgical clipping of aneurysm. Mild plagiocephaly. No acute skull fracture. Scalp soft tissues demonstrate no acute abnormality. Sinuses/Orbits: Globes and orbital soft tissues within normal limits. No significant paranasal sinus disease.  Mastoid air cells are clear. Other: None. IMPRESSION: 1. No acute intracranial abnormality. 2. 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 Electronically Signed   By: Jeannine Boga M.D.   On: 10/04/2017 17:02   Ct Angio Neck W And/or Wo Contrast  Result Date: 10/04/2017 CLINICAL DATA:  Initial evaluation for increased falls, stuttering speech. EXAM: CT ANGIOGRAPHY HEAD AND NECK TECHNIQUE: Multidetector CT imaging of the head and neck was performed using the standard protocol during bolus administration of intravenous contrast. Multiplanar CT image reconstructions and MIPs were obtained to evaluate the vascular anatomy. Carotid stenosis measurements (when applicable) are obtained utilizing NASCET criteria, using the distal internal carotid diameter as the denominator. CONTRAST:  26mL ISOVUE-370 IOPAMIDOL (ISOVUE-370) INJECTION 76% COMPARISON:  Prior CTA from 08/08/2017. FINDINGS: CTA NECK FINDINGS Aortic arch: Visualized aortic arch of normal caliber with normal branch pattern. Minimal  plaque within the arch itself without significant stenosis about the origin of the great vessels. Visualized subclavian arteries widely patent. Right carotid system: Right common carotid artery widely patent from its origin to the bifurcation. No significant atheromatous narrowing about the right carotid bifurcation. Right ICA patent from the bifurcation to the skull base without stenosis, dissection, or occlusion. Left carotid system: Left common carotid artery patent from its origin to the bifurcation without stenosis. Mild mixed plaque about the left bifurcation without significant stenosis. Left ICA widely patent distally without stenosis, dissection, or occlusion. Vertebral arteries: Left vertebral artery arises separately from the aortic arch. Right vertebral artery arises from the right subclavian artery. Vertebral arteries are largely code dominant. Vertebral arteries widely patent within the neck without stenosis, dissection, or occlusion. Skeleton: No acute osseous abnormality. No worrisome lytic or blastic osseous lesions. Mild cervical spondylolysis at C6-7. Other neck: No acute soft tissue abnormality within the neck. Thyroid normal. Salivary glands normal. No significant adenopathy. Upper chest: Visualized upper chest within normal limits. Visualized lungs are clear. Review of the MIP images confirms the above findings CTA HEAD FINDINGS Anterior circulation: Petrous segments widely patent bilaterally. Calcified plaque at the anterior genu of the cavernous right ICA without significant stenosis. Scattered atheromatous plaque about the cavernous and supraclinoid left ICA without high-grade stenosis, stable from previous. 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 No residual aneurysm identified. ICA termini widely patent bilaterally. A1 segments and anterior communicating artery widely patent bilaterally. Mild to moderate atheromatous irregularity within the ACA is bilaterally, greater  on the left, which do remain patent to their distal aspects. M1 segments patent bilaterally without high-grade stenosis. Mild atheromatous irregularity about the left MCA bifurcation without high-grade stenosis. No proximal M2 occlusion distal MCA branches well perfused and symmetric with mild atheromatous irregularity. Posterior circulation: Vertebral arteries widely patent to the vertebrobasilar junction without stenosis. Patent right PICA. Dominant left AICA. Basilar artery widely patent to its distal aspect without stenosis. Superior cerebral arteries patent bilaterally. Posterior cerebral arteries primarily supplied via the basilar. PCAs demonstrate mild atheromatous irregularity but are widely patent to their distal aspects without hemodynamically significant stenosis. Small/hypoplastic bilateral posterior communicating arteries noted. Venous sinuses: Patent. Anatomic variants: None. Delayed phase: No abnormal enhancement. Review of the MIP images confirms the above findings IMPRESSION: CTA OF THE NECK: 1. Negative CTA of the neck with no large vessel occlusion or hemodynamically significant stenosis. 2. Minor atheromatous change about the aortic arch and left carotid bifurcation without stenosis. CTA OF THE HEAD 1. Prior supraclinoid right ICA aneurysm clipping without complicating features. No residual aneurysm. 2. Mild  intracranial atherosclerotic change involving the anterior and posterior circulation, most notable at the distal left ACA. No high-grade proximal or correctable stenosis identified. Overall, appearance is stable relative to recent CTA from 08/08/2017. 3. No other acute intracranial abnormality. Electronically Signed   By: Jeannine Boga M.D.   On: 10/04/2017 19:48   Mr Brain Wo Contrast  Result Date: 10/04/2017 CLINICAL DATA:  Increasing gait imbalance of falls, stuttering speech, fatigue. History of stroke, brain aneurysm (s/p supraclinoid aneurysm clipping), hypertension,  hyperlipidemia, seizures. EXAM: MRI HEAD WITHOUT CONTRAST TECHNIQUE: Multiplanar, multiecho pulse sequences of the brain and surrounding structures were obtained without intravenous contrast. COMPARISON:  CT head October 04, 2017 and MRI of the head July 21, 2010 FINDINGS: Mild motion degraded examination. INTRACRANIAL CONTENTS: No reduced diffusion to suggest acute ischemia. No susceptibility artifact to suggest hemorrhage. Borderline parenchymal brain volume loss for age. Patchy supratentorial white matter FLAIR T2 hyperintensities are unchanged no suspicious parenchymal signal, masses, mass effect. No abnormal extra-axial fluid collections. No extra-axial masses. VASCULAR: Susceptibility artifact associated with RIGHT ICA aneurysm clip and RIGHT craniotomy. SKULL AND UPPER CERVICAL SPINE: No abnormal sellar expansion. No suspicious calvarial bone marrow signal. S post RIGHT craniotomy. Craniocervical junction maintained. SINUSES/ORBITS: The mastoid air-cells and included paranasal sinuses are well-aerated.The included ocular globes and orbital contents are non-suspicious. OTHER: None. IMPRESSION: 1. No acute intracranial process and mildly motion degraded examination. 2. Status post ICA aneurysm clipping. 3. Stable borderline parenchymal brain volume loss for age and mild chronic small vessel ischemic changes. Electronically Signed   By: Elon Alas M.D.   On: 10/04/2017 21:55    EKG:   Orders placed or performed during the hospital encounter of 10/04/17  . ED EKG  . ED EKG  . EKG 12-Lead  . EKG 12-Lead  . EKG 12-Lead  . EKG 12-Lead      Management plans discussed with the patient, family and they are in agreement.  CODE STATUS:     Code Status Orders  (From admission, onward)        Start     Ordered   10/05/17 0016  Full code  Continuous     10/05/17 0016    Code Status History    This patient has a current code status but no historical code status.      TOTAL TIME TAKING  CARE OF THIS PATIENT: 45 minutes.    Avel Peace Salary M.D on 10/06/2017 at 10:32 AM  Between 7am to 6pm - Pager - [phone]  After 6pm go to [email or web address] - password [email or web address] [email or web address]  Office  [phone]  CC: Primary care physician; Donnie Coffin, MD   Note: This dictation was prepared with Dragon dictation along with smaller phrase technology. Any transcriptional errors that result from this process are unintentional.

## 2017-10-06 NOTE — Progress Notes (Signed)
Pt is being discharged today, discharge instructions were reviewed with her and she verified understanding. 4 paper prescriptions were given to her. IVs removed and all belongings were packed and returned to her. She is waiting on her ride and will be wheeled out in a wheelchair by staff.

## 2017-10-08 LAB — VITAMIN B1: Vitamin B1 (Thiamine): 126 nmol/L (ref 66.5–200.0)

## 2017-10-29 ENCOUNTER — Other Ambulatory Visit: Payer: Self-pay | Admitting: Student

## 2017-10-29 DIAGNOSIS — M5412 Radiculopathy, cervical region: Secondary | ICD-10-CM

## 2017-11-05 NOTE — Progress Notes (Deleted)
11/06/2017 9:39 PM   Robin Jensen 13-Aug-1960 [id number]  Referring provider: Donnie Coffin, MD [place], [place] [address]  No chief complaint on file.   HPI: 57 year old Caucasian female with recurrent UTIs, vaginal atrophy and mixed urinary incontinence who presents today for a 6-week follow-up after trial of Myrbetriq for her incontinence.     Background history Patient is a 36 -year-old Caucasian female who is referred to [name] by [name], AGNP for recurrent urinary tract infections.   Patient states that she has had two urinary tract infections over the last year and 5 the year before.  Reviewing her records,  she has had no documented UTI's available to me at the time of the visit.    Her symptoms with a urinary tract infection consist of urgency, frequency and urge incontinence.   She endorses dysuria, suprapubic pain and back pain with UTI.   She has not had any recent fevers, chills, nausea or vomiting.  She does not have a history of nephrolithiasis, GU surgery or GU trauma.  She is not sexually active.  She is postmenopausal.  She admits to constipation.    She does engage in good perineal hygiene. She does not take tub baths.  She is having frequency, nocturia x2 and intermittency.  She has incontinence.  She is using incontinence pads.  She is going through 5 depends daily.   Her CATH UA today was negative.  Her PVR is 150 mL.  She has been on two days of Macrobid .was prescribed by her PCP for an UTI.  She is not having pain with bladder filling.  She has not had any recent imaging studies.  She is not drinking water.  She drinks diet Mountain Dew (4L daily) and sometimes coffee.  No juices.  Quit smoking 15 years ago.    At her visit on May 09, 2017, we discussed UTI prevention strategies, she was initiated on vaginal estrogen cream for her vaginal atrophy, she was referred to physical therapy and started on Myrbetriq 25 mg daily.  The  patient has been experiencing urgency x 4-7, frequency x 8 or more, is restricting fluids to avoid visits to the restroom, is engaging in toilet mapping, incontinence x 4-7 and nocturia x 4-7.   Her PVR is 52 mL.  Her BP is 148/101.  She states that she found the Myrbetriq samples helpful as her symptoms returned after she ran out of her samples 2 weeks ago.  She has added more water to her intake and has decreased her [facility] intake drastically.  She is now experiencing headaches.  ***  She is applying the vaginal estrogen cream three nights weekly.  ***  She has started PT therapy, but she has not continued the therapy.    PMH: Past Medical History:  Diagnosis Date  . 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 Anxiety   . Bipolar 1 disorder ([place])   . Cancer ([place])    uterine  . Depression   . Dry mouth   . DVT of leg (deep venous thrombosis) ([facility])    history of  . Easy bruising   . Eye dryness   . Fatigue   . GERD (gastroesophageal reflux disease)   . Hyperlipidemia   . Hypertension   . Hypothyroid   . Migraine   . Migraine   . Seizure disorder ([place])   . Seizures ([place])    last year last  seizure-had brain aneurysm. ON meds  . Stroke ([place]) 1997   No deficits  . Suicidal ideation    attempted overdoses in past    Surgical History: Past Surgical History:  Procedure Laterality Date  . ABDOMINAL HYSTERECTOMY    . CEREBRAL ANEURYSM REPAIR  1997  . CESAREAN SECTION    . CHOLECYSTECTOMY    . COLONOSCOPY WITH PROPOFOL N/A 08/20/2012   Procedure: COLONOSCOPY WITH PROPOFOL;  Surgeon: [name], MD;  Location: [facility];  Service: Endoscopy;  Laterality: N/A;  In cecum at [place]; Total Withdrawal Time= 12 minutes  . COLONOSCOPY WITH PROPOFOL N/A 09/28/2017   Procedure: COLONOSCOPY WITH PROPOFOL;  Surgeon: [name], MD;  Location: [facility] ENDOSCOPY;  Service: Endoscopy;  Laterality: N/A;  . ESOPHAGOGASTRODUODENOSCOPY (EGD) WITH PROPOFOL N/A 11/20/2016   Procedure:  ESOPHAGOGASTRODUODENOSCOPY (EGD) WITH PROPOFOL;  Surgeon: [name], MD;  Location: [facility] ENDOSCOPY;  Service: Endoscopy;  Laterality: N/A;  . ESOPHAGOGASTRODUODENOSCOPY (EGD) WITH PROPOFOL N/A 09/28/2017   Procedure: ESOPHAGOGASTRODUODENOSCOPY (EGD) WITH PROPOFOL;  Surgeon: [name], MD;  Location: [facility] ENDOSCOPY;  Service: Endoscopy;  Laterality: N/A;  . RADICAL HYSTERECTOMY      Home Medications:  Allergies as of 11/06/2017      Reactions   Bee Venom Anaphylaxis   Penicillins Shortness Of Breath, Rash   Latex    Lisinopril    Other    Sulfa Antibiotics       Medication List        Accurate as of 11/05/17  9:39 PM. Always use your most recent med list.          atorvastatin 40 MG tablet Commonly known as:  LIPITOR Take 40 mg by mouth at bedtime.   clarithromycin 500 MG tablet Commonly known as:  BIAXIN Take 1 tablet (500 mg total) by mouth every 12 (twelve) hours.   cyclobenzaprine 10 MG tablet Commonly known as:  FLEXERIL Take 10 mg by mouth every 8 (eight) hours as needed for muscle spasms.   diazepam 5 MG tablet Commonly known as:  VALIUM Take 5 mg by mouth at bedtime.   donepezil 5 MG tablet Commonly known as:  ARICEPT Take 5 mg by mouth at bedtime.   EPINEPHrine 0.3 mg/0.3 mL Soaj injection Commonly known as:  EPI-PEN Inject 0.3 mg into the muscle as needed. For anaphylactic reaction   estradiol 0.1 MG/GM vaginal cream Commonly known as:  ESTRACE VAGINAL Apply 0.5mg  (pea-sized amount)  just inside the vaginal introitus with a finger-tip on Monday, Wednesday and Friday nights.   gabapentin 300 MG capsule Commonly known as:  NEURONTIN Take 300 mg by mouth 3 (three) times daily.   labetalol 300 MG tablet Commonly known as:  NORMODYNE Take 1 tablet (300 mg total) by mouth 2 (two) times daily.   lamoTRIgine 200 MG tablet Commonly known as:  LAMICTAL Take 300 mg by mouth daily.   levothyroxine 75 MCG tablet Commonly known as:   SYNTHROID, LEVOTHROID Take 1 tablet (75 mcg total) by mouth daily before breakfast.   losartan-hydrochlorothiazide 100-25 MG tablet Commonly known as:  HYZAAR Take 1 tablet by mouth daily.   lurasidone 80 MG Tabs tablet Commonly known as:  LATUDA Take 80 mg by mouth at bedtime.   metroNIDAZOLE 500 MG tablet Commonly known as:  FLAGYL Take 1 tablet (500 mg total) by mouth every 12 (twelve) hours.   nitrofurantoin (macrocrystal-monohydrate) 100 MG capsule Commonly known as:  MACROBID Take 1 capsule (100 mg total) by mouth every  12 (twelve) hours.   nortriptyline 10 MG capsule Commonly known as:  PAMELOR Take 20 mg by mouth at bedtime.   pantoprazole 40 MG tablet Commonly known as:  PROTONIX Take 1 tablet (40 mg total) by mouth 2 (two) times daily.   phenytoin 200 MG ER capsule Commonly known as:  DILANTIN Take 400 mg by mouth daily.   potassium chloride 10 MEQ tablet Commonly known as:  K-DUR Take 10 mEq by mouth daily.   tiZANidine 2 MG tablet Commonly known as:  ZANAFLEX Take 2 mg by mouth 2 (two) times daily.   Verapamil HCl CR 300 MG Cp24 Take 1 capsule (300 mg total) by mouth daily.       Allergies:  Allergies  Allergen Reactions  . Bee Venom Anaphylaxis  . Penicillins Shortness Of Breath and Rash  . Latex   . Lisinopril   . Other   . Sulfa Antibiotics     Family History: Family History  Problem Relation Age of Onset  . Colon cancer Father        less than age 55  . Colon cancer Paternal Uncle        32s  . Colon cancer Paternal Grandfather   . Breast cancer Maternal Aunt   . Diabetes Sister   . Arthritis Brother   . Hematuria Mother   . Kidney cancer Maternal Grandmother   . Kidney disease Maternal Grandmother   . Prostate cancer Neg Hx     Social History:  reports that she has been smoking.  She has never used smokeless tobacco. She reports that she does not drink alcohol or use drugs.  ROS:                                         Physical Exam: There were no vitals taken for this visit.  Constitutional: Well nourished. Alert and oriented, No acute distress. HEENT: [place] AT, moist mucus membranes. Trachea midline, no masses. Cardiovascular: No clubbing, cyanosis, or edema. Respiratory: Normal respiratory effort, no increased work of breathing. GI: Abdomen is soft, non tender, non distended, no abdominal masses. Liver and spleen not palpable.  No hernias appreciated.  Stool sample for occult testing is not indicated.   GU: No CVA tenderness.  No bladder fullness or masses.  Normal external genitalia, normal pubic hair distribution, no lesions.  Normal urethral meatus, no lesions, no prolapse, no discharge.   No urethral masses, tenderness and/or tenderness. No bladder fullness, tenderness or masses. Normal vagina mucosa, good estrogen effect, no discharge, no lesions, good pelvic support, no cystocele or rectocele noted.  No cervical motion tenderness.  Uterus is freely mobile and non-fixed.  No adnexal/parametria masses or tenderness noted.  Anus and perineum are without rashes or lesions.   *** Skin: No rashes, bruises or suspicious lesions. Lymph: No cervical or inguinal adenopathy. Neurologic: Grossly intact, no focal deficits, moving all 4 extremities. Psychiatric: Normal mood and affect.  Laboratory Data: Lab Results  Component Value Date   WBC 6.7 10/04/2017   HGB 12.2 10/04/2017   HCT 34.9 (L) 10/04/2017   MCV 84.1 10/04/2017   PLT 208 10/04/2017    Lab Results  Component Value Date   CREATININE 1.25 (H) 10/04/2017    No results found for: PSA  No results found for: TESTOSTERONE  Lab Results  Component Value Date   HGBA1C 5.2 10/04/2017  Lab Results  Component Value Date   TSH 1.380 10/04/2017       Component Value Date/Time   CHOL (H) 07/22/2010 0740    222        ATP III CLASSIFICATION:  <200     mg/dL   Desirable  200-239  mg/dL   Borderline High  >=240    mg/dL    High          HDL 37 (L) 07/22/2010 0740   CHOLHDL 6.0 07/22/2010 0740   VLDL 78 (H) 07/22/2010 0740   LDLCALC (H) 07/22/2010 0740    107        Total Cholesterol/HDL:CHD Risk Coronary Heart Disease Risk Table                     Men   Women  1/2 Average Risk   3.4   3.3  Average Risk       5.0   4.4  2 X Average Risk   9.6   7.1  3 X Average Risk  23.4   11.0        Use the calculated Patient Ratio above and the CHD Risk Table to determine the patient's CHD Risk.        ATP III CLASSIFICATION (LDL):  <100     mg/dL   Optimal  100-129  mg/dL   Near or Above                    Optimal  130-159  mg/dL   Borderline  160-189  mg/dL   High  >190     mg/dL   Very High    Lab Results  Component Value Date   AST 33 10/04/2017   Lab Results  Component Value Date   ALT 20 10/04/2017   No components found for: ALKALINEPHOPHATASE No components found for: BILIRUBINTOTAL  No results found for: ESTRADIOL  Urinalysis ***  See Epic.   I have reviewed the labs.   Assessment & Plan:    1. Recurrent UTI's Reviewed UTI prevention strategies  2. Vaginal atrophy  -continue applying the vaginal estrogen cream three nights weekly  - She will follow up in three months for an exam.    3. Mixed incontinence  - found the Myrbetriq 25 mg daily helpful - script sent to pharmacy  - RTC in 3 months for OAB questionnaire, PVR and exam                                      No follow-ups on file.  These notes generated with voice recognition software. I apologize for typographical errors.  Zara Council, PA-C  [facility] [address] [place], [place] [address] [phone]

## 2017-11-06 ENCOUNTER — Ambulatory Visit: Payer: Medicare Other | Admitting: Urology

## 2017-11-08 ENCOUNTER — Encounter: Payer: Self-pay | Admitting: Urology

## 2017-11-13 ENCOUNTER — Ambulatory Visit
Admission: RE | Admit: 2017-11-13 | Discharge: 2017-11-13 | Disposition: A | Discharge status: Home / Self Care | Payer: Medicare Other | Source: Ambulatory Visit | Attending: Student | Admitting: Student

## 2017-11-13 DIAGNOSIS — M5412 Radiculopathy, cervical region: Secondary | ICD-10-CM

## 2017-11-13 DIAGNOSIS — G959 Disease of spinal cord, unspecified: Secondary | ICD-10-CM | POA: Insufficient documentation

## 2017-11-13 DIAGNOSIS — M779 Enthesopathy, unspecified: Secondary | ICD-10-CM | POA: Insufficient documentation

## 2017-11-13 DIAGNOSIS — M4802 Spinal stenosis, cervical region: Secondary | ICD-10-CM | POA: Diagnosis not present

## 2017-11-20 DIAGNOSIS — R42 Dizziness and giddiness: Secondary | ICD-10-CM | POA: Insufficient documentation

## 2018-02-06 ENCOUNTER — Ambulatory Visit: Payer: Self-pay | Admitting: Urology

## 2018-02-14 ENCOUNTER — Ambulatory Visit: Payer: Self-pay | Admitting: Urology

## 2018-02-19 ENCOUNTER — Ambulatory Visit: Payer: Self-pay | Admitting: Urology

## 2018-02-19 ENCOUNTER — Encounter: Payer: Self-pay | Admitting: Urology

## 2018-03-14 ENCOUNTER — Ambulatory Visit (INDEPENDENT_AMBULATORY_CARE_PROVIDER_SITE_OTHER): Payer: Medicare Other | Admitting: Urology

## 2018-03-14 ENCOUNTER — Encounter: Payer: Self-pay | Admitting: Urology

## 2018-03-14 ENCOUNTER — Other Ambulatory Visit: Payer: Self-pay

## 2018-03-14 ENCOUNTER — Other Ambulatory Visit: Payer: Self-pay | Admitting: Urology

## 2018-03-14 VITALS — BP 122/86 | HR 63

## 2018-03-14 DIAGNOSIS — N3946 Mixed incontinence: Secondary | ICD-10-CM | POA: Diagnosis not present

## 2018-03-14 DIAGNOSIS — N390 Urinary tract infection, site not specified: Secondary | ICD-10-CM | POA: Diagnosis not present

## 2018-03-14 DIAGNOSIS — N952 Postmenopausal atrophic vaginitis: Secondary | ICD-10-CM | POA: Diagnosis not present

## 2018-03-14 LAB — BLADDER SCAN AMB NON-IMAGING

## 2018-03-14 NOTE — Progress Notes (Signed)
03/14/2018 3:35 PM   Robin Jensen 1960-10-11 [id number]  Referring provider: Donnie Coffin, MD [facility], [place] [address]  Chief Complaint  Patient presents with  . Overactive Bladder   HPI: Patient is a 57 y.o. Caucasian female with recurrent UTIs, vaginal atrophy and mixed urinary incontinence who presents today for a follow up for recurrent UTIs and vaginal atrophy.  Recurrent UTIs She complains of frequent UTIs that are being managed by Dr. Clide Deutscher with a daily antibiotic. Over the past year, patient reports experiencing a total of 7 UTIs. She is currently taking Cipro. She reports urinary frequency, suprapubic pain, difficulty starting stream, flank pain, nausea, vomiting chills and reports a fever of 105 one evening with UTI.  She did not go to the ER for fever. She denies hematuria and dysuria. She denies history of kidney stone, bladder surgery and kidney surgery. She complains of constipation and is taking fiber for it. She reports wiping front to back and denies taking tub baths.   Last urine culture came back with bacteria present.  She drinks an average of 3 glasses of water a day and drinks a cup of coffee every other day. She still drinks Diet [facility] frequently (4L daily). Denies drinking juice, alcohol and tea. Started smoking again in the middle of this year.    Vaginal Atrophy She complains of consistent stabbing pain in her vagina and denies bloody discharge and itching. She experiences pain with intercourse that has stopped her from participating in intercourse. She has had a partial hysterectomy.  She found that Myrbetriq was helpful but her insurance will not cover it so she did not get a refill.  She still takes Estrace Monday, Wednesday and Friday weekly. She has not implemented any of the management strategies recommended at last visit. She denies eating yogurt and/or taking probiotics.  Mixed urinary incontinence The patient is   experiencing urgency x  4-7, frequency x 4-7 , not restricting fluids to avoid visits to the restroom, is engaging in toilet mapping, incontinence x 8 or more and nocturia x 4-7.   She is wearing 5 depends daily.  Her BP is 122/86.   Her PVR is 21 mL.    Background history Patient is a 54 -year-old Caucasian female who is referred to [name] by [name], AGNP for recurrent urinary tract infections.   Patient states that she has had two urinary tract infections over the last year and 5 the year before.  Reviewing her records,  she has had no documented UTI's available to me at the time of the visit.    Her symptoms with a urinary tract infection consist of urgency, frequency and urge incontinence.   She endorses dysuria, suprapubic pain and back pain with UTI.   She has not had any recent fevers, chills, nausea or vomiting.  She does not have a history of nephrolithiasis, GU surgery or GU trauma.  She is not sexually active.  She is postmenopausal.  She admits to constipation.    She does engage in good perineal hygiene. She does not take tub baths.  She is having frequency, nocturia x2 and intermittency.  She has incontinence.  She is using incontinence pads.  She is going through 5 depends daily.   Her CATH UA today was negative.  Her PVR is 150 mL.  She has been on two days of Macrobid .was prescribed by her PCP for an UTI.  She is not having  pain with bladder filling.  She has not had any recent imaging studies.  She is not drinking water.  She drinks diet Mountain Dew (4L daily) and sometimes coffee.  No juices.  Quit smoking 15 years ago.  At her visit on May 09, 2017, we discussed UTI prevention strategies, she was initiated on vaginal estrogen cream for her vaginal atrophy, she was referred to physical therapy and started on Myrbetriq 25 mg daily.  Today, she states that she feels that she may be developing a urinary tract infection.  She stated that her symptoms started 3 days ago.  She is having  suprapubic discomfort, urgency and dysuria.  Her UA at today's visit is positive for nitrates, greater than 30 WBCs, 3-10 RBCs and many bacteria.  Patient denies any gross hematuria, dysuria or suprapubic/flank pain.  Patient denies any fevers, chills, nausea or vomiting.   The patient has been experiencing urgency x 4-7, frequency x 8 or more, is restricting fluids to avoid visits to the restroom, is engaging in toilet mapping, incontinence x 4-7 and nocturia x 4-7.   Her PVR is 52 mL.  Her BP is 148/101.  She states that she found the Myrbetriq samples helpful as her symptoms returned after she ran out of her samples 2 weeks ago.  She has added more water to her intake and has decreased her [facility] intake drastically.  She is now experiencing headaches. She is applying the vaginal estrogen cream three nights weekly.    She has started PT therapy.    PMH: Past Medical History:  Diagnosis Date  . 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 Anxiety   . Bipolar 1 disorder ([place])   . Cancer ([place])    uterine  . Depression   . Dry mouth   . DVT of leg (deep venous thrombosis) ([facility])    history of  . Easy bruising   . Eye dryness   . Fatigue   . GERD (gastroesophageal reflux disease)   . Hyperlipidemia   . Hypertension   . Hypothyroid   . Migraine   . Migraine   . Seizure disorder ([place])   . Seizures ([place])    last year last seizure-had brain aneurysm. ON meds  . Stroke ([place]) 1997   No deficits  . Suicidal ideation    attempted overdoses in past    Surgical History: Past Surgical History:  Procedure Laterality Date  . ABDOMINAL HYSTERECTOMY    . CEREBRAL ANEURYSM REPAIR  1997  . CESAREAN SECTION    . CHOLECYSTECTOMY    . COLONOSCOPY WITH PROPOFOL N/A 08/20/2012   Procedure: COLONOSCOPY WITH PROPOFOL;  Surgeon: [name], MD;  Location: [facility];  Service: Endoscopy;  Laterality: N/A;  In cecum at [place]; Total Withdrawal Time= 12 minutes  . COLONOSCOPY WITH PROPOFOL N/A 09/28/2017    Procedure: COLONOSCOPY WITH PROPOFOL;  Surgeon: [name], MD;  Location: [facility]COPY;  Service: Endoscopy;  Laterality: N/A;  . ESOPHAGOGASTRODUODENOSCOPY (EGD) WITH PROPOFOL N/A 11/20/2016   Procedure: ESOPHAGOGASTRODUODENOSCOPY (EGD) WITH PROPOFOL;  Surgeon: [name], MD;  Location: [facility] ENDOSCOPY;  Service: Endoscopy;  Laterality: N/A;  . ESOPHAGOGASTRODUODENOSCOPY (EGD) WITH PROPOFOL N/A 09/28/2017   Procedure: ESOPHAGOGASTRODUODENOSCOPY (EGD) WITH PROPOFOL;  Surgeon: [name], MD;  Location: [facility] ENDOSCOPY;  Service: Endoscopy;  Laterality: N/A;  . RADICAL HYSTERECTOMY      Home Medications:  Allergies as of 03/14/2018      Reactions   Bee Venom  Anaphylaxis   Penicillins Shortness Of Breath, Rash   Latex    Lisinopril    Other    Sulfa Antibiotics       Medication List        Accurate as of 03/14/18  3:35 PM. Always use your most recent med list.          atorvastatin 40 MG tablet Commonly known as:  LIPITOR Take 40 mg by mouth at bedtime.   cyclobenzaprine 10 MG tablet Commonly known as:  FLEXERIL Take 10 mg by mouth every 8 (eight) hours as needed for muscle spasms.   diazepam 5 MG tablet Commonly known as:  VALIUM Take 5 mg by mouth at bedtime.   donepezil 5 MG tablet Commonly known as:  ARICEPT Take 5 mg by mouth at bedtime.   EPINEPHrine 0.3 mg/0.3 mL Soaj injection Commonly known as:  EPI-PEN Inject 0.3 mg into the muscle as needed. For anaphylactic reaction   estradiol 0.1 MG/GM vaginal cream Commonly known as:  ESTRACE Apply 0.5mg  (pea-sized amount)  just inside the vaginal introitus with a finger-tip on Monday, Wednesday and Friday nights.   gabapentin 300 MG capsule Commonly known as:  NEURONTIN Take 300 mg by mouth 3 (three) times daily.   labetalol 300 MG tablet Commonly known as:  NORMODYNE Take 1 tablet (300 mg total) by mouth 2 (two) times daily.   lamoTRIgine 200 MG tablet Commonly known as:  LAMICTAL Take  300 mg by mouth daily.   levothyroxine 75 MCG tablet Commonly known as:  SYNTHROID, LEVOTHROID Take 1 tablet (75 mcg total) by mouth daily before breakfast.   losartan-hydrochlorothiazide 100-25 MG tablet Commonly known as:  HYZAAR Take 1 tablet by mouth daily.   lurasidone 80 MG Tabs tablet Commonly known as:  LATUDA Take 80 mg by mouth at bedtime.   nortriptyline 10 MG capsule Commonly known as:  PAMELOR Take 20 mg by mouth at bedtime.   pantoprazole 40 MG tablet Commonly known as:  PROTONIX Take 1 tablet (40 mg total) by mouth 2 (two) times daily.   phenytoin 200 MG ER capsule Commonly known as:  DILANTIN Take 400 mg by mouth daily.   potassium chloride 10 MEQ tablet Commonly known as:  K-DUR Take 10 mEq by mouth daily.   REXULTI 1 MG Tabs Generic drug:  Brexpiprazole Take 1 tablet by mouth daily.   tiZANidine 2 MG tablet Commonly known as:  ZANAFLEX Take 2 mg by mouth 2 (two) times daily.   Verapamil HCl CR 300 MG Cp24 Take 1 capsule (300 mg total) by mouth daily.       Allergies:  Allergies  Allergen Reactions  . Bee Venom Anaphylaxis  . Penicillins Shortness Of Breath and Rash  . Latex   . Lisinopril   . Other   . Sulfa Antibiotics     Family History: Family History  Problem Relation Age of Onset  . Colon cancer Father        less than age 66  . Colon cancer Paternal Uncle        74s  . Colon cancer Paternal Grandfather   . Breast cancer Maternal Aunt   . Diabetes Sister   . Arthritis Brother   . Hematuria Mother   . Kidney cancer Maternal Grandmother   . Kidney disease Maternal Grandmother   . Prostate cancer Neg Hx     Social History:  reports that she has been smoking. She has never used smokeless tobacco. She reports that  she does not drink alcohol or use drugs.  ROS: UROLOGY Frequent Urination?: Yes Hard to postpone urination?: No Burning/pain with urination?: No Get up at night to urinate?: Yes Leakage of urine?: Yes Urine  stream starts and stops?: No Trouble starting stream?: Yes Do you have to strain to urinate?: No Blood in urine?: No Urinary tract infection?: Yes Sexually transmitted disease?: No Injury to kidneys or bladder?: No Painful intercourse?: Yes Weak stream?: No Currently pregnant?: No Vaginal bleeding?: No Last menstrual period?: n  Gastrointestinal Nausea?: Yes Vomiting?: Yes Indigestion/heartburn?: No Diarrhea?: No Constipation?: No  Constitutional Fever: No Night sweats?: No Weight loss?: No Fatigue?: No  Skin Skin rash/lesions?: No Itching?: No  Eyes Blurred vision?: No Double vision?: No  Ears/Nose/Throat Sore throat?: No Sinus problems?: No  Hematologic/Lymphatic Swollen glands?: No Easy bruising?: No  Cardiovascular Leg swelling?: No Chest pain?: No  Respiratory Cough?: No Shortness of breath?: No  Endocrine Excessive thirst?: Yes  Musculoskeletal Back pain?: No Joint pain?: No  Neurological Headaches?: Yes Dizziness?: No  Psychologic Depression?: Yes Anxiety?: Yes  Physical Exam: BP 122/86   Pulse 63   Constitutional:  Well nourished. Alert and oriented, No acute distress. HEENT: [place] AT, moist mucus membranes.  Trachea midline, no masses. Cardiovascular: No clubbing, cyanosis, or edema. Respiratory: Normal respiratory effort, no increased work of breathing. GI: Abdomen is soft, non tender, non distended, no abdominal masses. Liver and spleen not palpable.  No hernias appreciated.  Stool sample for occult testing is not indicated.   GU: No CVA tenderness.  No bladder fullness or masses.  Atrophic external genitalia, Normal pubic hair distribution, no lesions.  Normal urethral meatus, no lesions, no prolapse, no discharge.   No urethral masses, tenderness and/or tenderness. No bladder fullness, tenderness or masses. Pale vagina mucosa, Fair estrogen effect, no discharge, no lesions, good pelvic support, No cystocele and no rectocele noted.   Cervix is surgically absent.  Uterus is surgically absent.  No adnexal/parametria masses or tenderness noted.  Anus and perineum are without rashes or lesions. Rectal tenderness when pressed towards rectal vault.    Skin: No rashes, bruises or suspicious lesions. Lymph: No cervical or inguinal adenopathy. Neurologic: Grossly intact, no focal deficits, moving all 4 extremities. Psychiatric: Normal mood and affect.   Laboratory Data: Lab Results  Component Value Date   WBC 6.7 10/04/2017   HGB 12.2 10/04/2017   HCT 34.9 (L) 10/04/2017   MCV 84.1 10/04/2017   PLT 208 10/04/2017    Lab Results  Component Value Date   CREATININE 1.25 (H) 10/04/2017    No results found for: PSA  No results found for: TESTOSTERONE  Lab Results  Component Value Date   HGBA1C 5.2 10/04/2017    Lab Results  Component Value Date   TSH 1.380 10/04/2017       Component Value Date/Time   CHOL (H) 07/22/2010 0740    222        ATP III CLASSIFICATION:  <200     mg/dL   Desirable  200-239  mg/dL   Borderline High  >=240    mg/dL   High          HDL 37 (L) 07/22/2010 0740   CHOLHDL 6.0 07/22/2010 0740   VLDL 78 (H) 07/22/2010 0740   LDLCALC (H) 07/22/2010 0740    107        Total Cholesterol/HDL:CHD Risk Coronary Heart Disease Risk Table  Men   Women  1/2 Average Risk   3.4   3.3  Average Risk       5.0   4.4  2 X Average Risk   9.6   7.1  3 X Average Risk  23.4   11.0        Use the calculated Patient Ratio above and the CHD Risk Table to determine the patient's CHD Risk.        ATP III CLASSIFICATION (LDL):  <100     mg/dL   Optimal  100-129  mg/dL   Near or Above                    Optimal  130-159  mg/dL   Borderline  160-189  mg/dL   High  >190     mg/dL   Very High    Lab Results  Component Value Date   AST 33 10/04/2017   Lab Results  Component Value Date   ALT 20 10/04/2017   No components found for: ALKALINEPHOPHATASE No components found for:  BILIRUBINTOTAL  No results found for: ESTRADIOL  I have reviewed the labs.  Pertinent Imaging Results for HELEM, REESOR (MRN [id number]) as of 03/14/2018 15:30  Ref. Range 03/14/2018 14:36  Scan Result Unknown 82ml     Assessment & Plan:    1. Recurrent UTI's  -Reviewed UTI prevention strategies  - patient is instructed to increase their water intake until the urine is pale yellow or clear (10 to 12 cups daily)   - patient is instructed to take probiotics (yogurt, oral pills or vaginal suppositories), take cranberry pills or drink the juice and Vitamin C 1,000 mg daily to acidify the urine   - Patient will sign a release form for [name] to obtain the results for her last 7 UTI's   2. Vaginal atrophy  - Continue using Estrace three nights weekly  3. Mixed incontinence  - Gave another sample of Myrbetriq 25 mg for 28 days and the patient will return in 3 weeks to follow up exam and symptom recheck  - I have advised the patient of the side effects of Myrbetriq, such as: elevation in BP, urinary retention and/or HA.                                  Return in about 3 weeks (around 04/04/2018) for PVR and OAB questionnaire.  These notes generated with voice recognition software. I apologize for typographical errors.  Laneta Simmers  [facility] [address] [place], [place] [address] [phone]  I, Temidayo Atanda-[place]leye , am acting as a Education administrator for [employer], PA-C.   I have reviewed the above documentation for accuracy and completeness, and I agree with the above.    Zara Council, PA-C

## 2018-04-09 ENCOUNTER — Ambulatory Visit (INDEPENDENT_AMBULATORY_CARE_PROVIDER_SITE_OTHER): Payer: Medicare Other | Admitting: Urology

## 2018-04-09 ENCOUNTER — Encounter: Payer: Self-pay | Admitting: Urology

## 2018-04-09 VITALS — BP 107/75 | HR 76 | Ht 61.0 in | Wt 167.5 lb

## 2018-04-09 DIAGNOSIS — N952 Postmenopausal atrophic vaginitis: Secondary | ICD-10-CM | POA: Diagnosis not present

## 2018-04-09 DIAGNOSIS — N3289 Other specified disorders of bladder: Secondary | ICD-10-CM | POA: Diagnosis not present

## 2018-04-09 DIAGNOSIS — N3946 Mixed incontinence: Secondary | ICD-10-CM

## 2018-04-09 LAB — URINALYSIS, COMPLETE
Bilirubin, UA: NEGATIVE
Glucose, UA: NEGATIVE
KETONES UA: NEGATIVE
Leukocytes, UA: NEGATIVE
Nitrite, UA: NEGATIVE
PROTEIN UA: NEGATIVE
RBC, UA: NEGATIVE
SPEC GRAV UA: 1.02 (ref 1.005–1.030)
UUROB: 0.2 mg/dL (ref 0.2–1.0)
pH, UA: 7 (ref 5.0–7.5)

## 2018-04-09 LAB — BLADDER SCAN AMB NON-IMAGING

## 2018-04-09 LAB — MICROSCOPIC EXAMINATION
RBC, UA: NONE SEEN /hpf (ref 0–2)
WBC, UA: NONE SEEN /hpf (ref 0–5)

## 2018-04-09 NOTE — Progress Notes (Addendum)
04/09/2018  7:44 AM   Robin Jensen 1961-02-19 [id number]  Referring provider: Donnie Coffin, MD [place], [place] [address]  Chief Complaint  Patient presents with  . Follow-up  . mixed stress and urge urinary incontinence   HPI: Robin Jensen is a 57 y.o. Caucasian female presenting today for her 3 week medication management visit. At her last appointment (03/14/2018) we discussed UTI prevention strategies and started a Myrbetriq trial. She reports experiencing depression.  Presumptive Recurrent UTIs/Bladder spasms/Mixed incontinence Managed by Dr. Clide Deutscher with a daily antibiotic (nitrofuratoin) and Cipro. Over the past year she reports 7 UTIs. She experiences constipation (defecating once a week) and self-treats with Mirilax (1-2x/daily for 12months). She has admited to maintaining good perineal hygiene and avoiding tub baths.  She has had 2 documented UTIs since 2017.  She reports urgency x 4-7 daily, frequency x 0-3 daily, incontinence x 0-3 daily, and nocturia x 0-3 daily. Her symptoms have improved on Myrbetriq with reduced incontinence volume and frequency. She engages in toilet mapping and utilizing fluid management strategies. UA (04/09/2018) bland.   She drinks an average of 3 glasses of water a day and drinks a cup of coffee every other day. She still drinks Diet [facility] frequently (4L daily). Denies drinking juice, alcohol and tea. Started smoking again in the middle of this year.    Have not received previous urine's and cultures from PCP's office yet.    She continues to have bladder spasms that are independent of voiding.  She has not seen improvement of the spasms with the Myrbetriq.  She describes that this a cramping feeling.  The pain is intermittent through most of the day.  Patient denies any gross hematuria, dysuria or flank pain.  Patient denies any fevers, chills, nausea or vomiting.   Her PVR is 0 mL.      Vaginal  Atrophy She reports stabbing pains in her vagina even though she has been using the vaginal estrogen cream MWF.  Her pain is consistent and independent of urination. She is postmenopausal and has had a partial hysterectomy.    PMH: Past Medical History:  Diagnosis Date  . 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 Anxiety   . Bipolar 1 disorder ([place])   . Cancer ([place])    uterine  . Depression   . Dry mouth   . DVT of leg (deep venous thrombosis) ([facility])    history of  . Easy bruising   . Eye dryness   . Fatigue   . GERD (gastroesophageal reflux disease)   . Hyperlipidemia   . Hypertension   . Hypothyroid   . Migraine   . Migraine   . Seizure disorder ([place])   . Seizures ([place])    last year last seizure-had brain aneurysm. ON meds  . Stroke ([place]) 1997   No deficits  . Suicidal ideation    attempted overdoses in past    Surgical History: Past Surgical History:  Procedure Laterality Date  . ABDOMINAL HYSTERECTOMY    . CEREBRAL ANEURYSM REPAIR  1997  . CESAREAN SECTION    . CHOLECYSTECTOMY    . COLONOSCOPY WITH PROPOFOL N/A 08/20/2012   Procedure: COLONOSCOPY WITH PROPOFOL;  Surgeon: [name], MD;  Location: [facility];  Service: Endoscopy;  Laterality: N/A;  In cecum at [place]; Total Withdrawal Time= 12 minutes  . COLONOSCOPY WITH PROPOFOL N/A 09/28/2017   Procedure: COLONOSCOPY WITH PROPOFOL;  Surgeon: [name],  MD;  Location: [facility] ENDOSCOPY;  Service: Endoscopy;  Laterality: N/A;  . ESOPHAGOGASTRODUODENOSCOPY (EGD) WITH PROPOFOL N/A 11/20/2016   Procedure: ESOPHAGOGASTRODUODENOSCOPY (EGD) WITH PROPOFOL;  Surgeon: [name], MD;  Location[facility] ENDOSCOPY;  Service: Endoscopy;  Laterality: N/A;  . ESOPHAGOGASTRODUODENOSCOPY (EGD) WITH PROPOFOL N/A 09/28/2017   Procedure: ESOPHAGOGASTRODUODENOSCOPY (EGD) WITH PROPOFOL;  Surgeon: [name], MD;  Location: [facility] ENDOSCOPY;  Service: Endoscopy;  Laterality: N/A;  . RADICAL HYSTERECTOMY      Home Medications:   Allergies as of 04/09/2018      Reactions   Bee Venom Anaphylaxis   Penicillins Shortness Of Breath, Rash   Latex    Lisinopril    Other    Sulfa Antibiotics       Medication List       Accurate as of April 09, 2018 11:59 PM. Always use your most recent med list.        atorvastatin 40 MG tablet Commonly known as:  LIPITOR Take 40 mg by mouth at bedtime.   Brexpiprazole 2 MG Tabs Take 2 mg by mouth.   cyclobenzaprine 10 MG tablet Commonly known as:  FLEXERIL Take 10 mg by mouth every 8 (eight) hours as needed for muscle spasms.   diazepam 5 MG tablet Commonly known as:  VALIUM Take 5 mg by mouth at bedtime.   donepezil 10 MG tablet Commonly known as:  ARICEPT Take 10 mg by mouth at bedtime.   EPINEPHrine 0.3 mg/0.3 mL Soaj injection Commonly known as:  EPI-PEN Inject 0.3 mg into the muscle as needed. For anaphylactic reaction   estradiol 0.1 MG/GM vaginal cream Commonly known as:  ESTRACE VAGINAL Apply 0.5mg  (pea-sized amount)  just inside the vaginal introitus with a finger-tip on Monday, Wednesday and Friday nights.   gabapentin 300 MG capsule Commonly known as:  NEURONTIN Take 300 mg by mouth 3 (three) times daily.   labetalol 200 MG tablet Commonly known as:  NORMODYNE Take 200 mg by mouth 2 (two) times daily.   labetalol 300 MG tablet Commonly known as:  NORMODYNE Take 1 tablet (300 mg total) by mouth 2 (two) times daily.   lamoTRIgine 200 MG tablet Commonly known as:  LAMICTAL Take 300 mg by mouth daily.   levothyroxine 75 MCG tablet Commonly known as:  SYNTHROID, LEVOTHROID Take 1 tablet (75 mcg total) by mouth daily before breakfast.   losartan-hydrochlorothiazide 100-25 MG tablet Commonly known as:  HYZAAR Take 1 tablet by mouth daily.   Lurasidone HCl 120 MG Tabs Take by mouth at bedtime.   nortriptyline 25 MG capsule Commonly known as:  PAMELOR Take 25 mg by mouth at bedtime.   pantoprazole 40 MG tablet Commonly known as:   PROTONIX Take 1 tablet (40 mg total) by mouth 2 (two) times daily.   phenytoin 200 MG ER capsule Commonly known as:  DILANTIN Take 400 mg by mouth daily.   potassium chloride 10 MEQ tablet Commonly known as:  K-DUR Take 10 mEq by mouth daily.   tiZANidine 2 MG tablet Commonly known as:  ZANAFLEX Take 2 mg by mouth 2 (two) times daily.   Verapamil HCl CR 300 MG Cp24 Take 1 capsule (300 mg total) by mouth daily.       Allergies:  Allergies  Allergen Reactions  . Bee Venom Anaphylaxis  . Penicillins Shortness Of Breath and Rash  . Latex   . Lisinopril   . Other   . Sulfa Antibiotics     Family History: Family History  Problem Relation Age of Onset  . Colon cancer Father        less than age 57  . Colon cancer Paternal Uncle        76s  . Colon cancer Paternal Grandfather   . Breast cancer Maternal Aunt   . Diabetes Sister   . Arthritis Brother   . Hematuria Mother   . Kidney cancer Maternal Grandmother   . Kidney disease Maternal Grandmother   . Prostate cancer Neg Hx     Social History:  reports that she has been smoking. She has never used smokeless tobacco. She reports that she does not drink alcohol or use drugs.  ROS: UROLOGY Frequent Urination?: No Hard to postpone urination?: No Burning/pain with urination?: Yes Get up at night to urinate?: No Leakage of urine?: No Urine stream starts and stops?: No Trouble starting stream?: No Do you have to strain to urinate?: No Blood in urine?: No Urinary tract infection?: Yes Sexually transmitted disease?: No Injury to kidneys or bladder?: No Painful intercourse?: Yes Weak stream?: No Currently pregnant?: No Vaginal bleeding?: No Last menstrual period?: n  Gastrointestinal Nausea?: Yes Vomiting?: Yes Indigestion/heartburn?: No Diarrhea?: No Constipation?: Yes  Constitutional Fever: No Night sweats?: No Weight loss?: No Fatigue?: Yes  Skin Skin rash/lesions?: No Itching?:  No  Eyes Blurred vision?: No Double vision?: No  Ears/Nose/Throat Sore throat?: No Sinus problems?: No  Hematologic/Lymphatic Swollen glands?: Yes Easy bruising?: No  Cardiovascular Leg swelling?: No Chest pain?: No  Respiratory Cough?: No Shortness of breath?: No  Endocrine Excessive thirst?: Yes  Musculoskeletal Back pain?: Yes Joint pain?: No  Neurological Headaches?: Yes Dizziness?: No  Psychologic Depression?: Yes Anxiety?: Yes  Physical Exam: BP 107/75 (BP Location: Left Arm, Patient Position: Sitting)   Pulse 76   Ht 5\' 1"  (1.549 m)   Wt 167 lb 8 oz (76 kg)   BMI 31.65 kg/m   Constitutional:  Alert and oriented, No acute distress. Respiratory: Normal respiratory effort, no increased work of breathing. GU: No CVA tenderness Skin: No rashes, bruises or suspicious lesions. Neurologic: Grossly intact, no focal deficits, moving all 4 extremities. Psychiatric: Normal mood and affect.  Laboratory Data: Urinalysis Few bacteria.  See Epic   Lab Results  Component Value Date   LABMICR See below: 04/09/2018   WBCUA None seen 04/09/2018   RBCUA None seen 04/09/2018   LABEPIT 0-10 04/09/2018   MUCUS Present (A) 04/09/2018   BACTERIA Few (A) 04/09/2018  I have reviewed the labs.  Pertinent Imaging Results for ROLA, LENNON (MRN [id number]) as of 04/10/2018 08:28  Ref. Range 04/09/2018 14:27  Scan Result Unknown 0 ml    Assessment & Plan:    1. Bladder spasms   - Due to her continued urogenital pain, I offered a cystoscopy. I described to the patient how the procedure is performed and the risk associated with the procedure, such as: Infection, bleeding, uncomfortableness for the first few days after the procedure, the possibility of a biopsy of an area of concern in the ureters or bladder and the possibility of a stent placement. Patient would like to move forward with a cystoscopy  - we have again requested the records from her PCP's office  -  UA bland - will send for culture as she will have a cystoscopy in the future   2. Vaginal Atrophy  - Continue vaginal estrogen cream, refill sent [facility] [place].   3. Mixed incontinence  - cannot continue the Myrbetriq at  this time due to her nortriptyline  - will readdress once her cystoscopy is completed   No follow-ups on file.  Laneta Simmers  [place] [address] [place], [place] [address] [phone]   I, Temidayo Atanda-[place]leye , am acting as a Education administrator for Nori Riis, PA-C  I have reviewed the above documentation for accuracy and completeness, and I agree with the above.    Zara Council, PA-C  Addendum:  Records received from PCP  Positive cultures:  + pan-sensitive e.coli on 07/08/2015  + staphylococcus epidermidis and viridans streptococcus resistant to ciprofloxacin, levofloxacin, oxacillin, penicillin and trimethoprim/sulfa on 07/21/2015  + e.coli resistant to ampicillin and piperacillin and beta hemolytic streptococcus, group B on 10/05/2015  + e.coli resistant to ampicillin and cephalothin on 02/24/2016  + cronobacter sakazakii resistant to cefazolin and cefuroxime on 12/14/2016  + klebsiella pneumoniae resistant to ampicillin on 01/30/2017  + staphylococcus epidermidis resistant to ciprofloxacin, penicillin and trimethoprim/sulfa on 04/21/2017  + staphylococcus epidermidis resistant to ciprofloxacin, levofloxacin, penicillin and trimethoprim/sulfa on 05/01/2017  + e.coli resistant to ampicillin on 05/26/2017  + enterobacter cloacae complex resistant to amoxicillin/clavulanic acid, cefazolin and cefuroxime on 08/22/2017  + e.coli resistant to ampicillin on 01/29/2018  + pan-sensitive e.coli on 03/05/2018   Serum creatinine 1.13 on 01/31/2017

## 2018-04-10 ENCOUNTER — Telehealth: Payer: Self-pay | Admitting: Urology

## 2018-04-10 MED ORDER — ESTRADIOL 0.1 MG/GM VA CREA: TOPICAL_CREAM | VAGINAL | 12 refills | Status: DC

## 2018-04-10 NOTE — Telephone Encounter (Signed)
Left detailed message informing patient we cannot prescribe myrbetriq at this time and it will be readdressed after her cystoscopy.

## 2018-04-10 NOTE — Telephone Encounter (Signed)
Please let Robin Jensen know that we cannot prescribe the Myrbetriq as she has been placed on nortriptyline.  There is a possible interaction between the Myrbetriq and nortriptyline that may cause a fatal heart arrhythmia.  We will need to readdress this once she has completed her cystoscopy.

## 2018-04-13 LAB — CULTURE, URINE COMPREHENSIVE

## 2018-04-15 ENCOUNTER — Other Ambulatory Visit: Payer: Self-pay | Admitting: Gastroenterology

## 2018-04-15 ENCOUNTER — Ambulatory Visit: Payer: Medicare Other

## 2018-04-15 ENCOUNTER — Telehealth: Payer: Self-pay | Admitting: Urology

## 2018-04-15 DIAGNOSIS — R112 Nausea with vomiting, unspecified: Secondary | ICD-10-CM

## 2018-04-15 NOTE — Telephone Encounter (Signed)
Attempted to reach pt, no answer left vm to call back  

## 2018-04-15 NOTE — Telephone Encounter (Signed)
I have received her records from her PCP's office.  She has had numerous positive urine cultures over the past two years.  If she is currently taking her low dose antibiotic, I suggest she discontinue the medication as it is not preventing her from having positive urine cultures.

## 2018-04-22 ENCOUNTER — Ambulatory Visit: Admission: RE | Admit: 2018-04-22 | Payer: Medicare Other | Source: Ambulatory Visit

## 2018-04-26 NOTE — Telephone Encounter (Signed)
Left detailed message informing patient to stop the preventative antibiotic because it is not helping.

## 2018-05-02 ENCOUNTER — Other Ambulatory Visit: Payer: Self-pay

## 2018-05-02 ENCOUNTER — Ambulatory Visit (INDEPENDENT_AMBULATORY_CARE_PROVIDER_SITE_OTHER): Payer: Medicare Other | Admitting: Urology

## 2018-05-02 ENCOUNTER — Encounter: Payer: Self-pay | Admitting: Urology

## 2018-05-02 VITALS — BP 142/103 | HR 72 | Ht 62.0 in | Wt 165.0 lb

## 2018-05-02 DIAGNOSIS — N390 Urinary tract infection, site not specified: Secondary | ICD-10-CM

## 2018-05-02 DIAGNOSIS — R102 Pelvic and perineal pain: Secondary | ICD-10-CM

## 2018-05-02 DIAGNOSIS — R399 Unspecified symptoms and signs involving the genitourinary system: Secondary | ICD-10-CM

## 2018-05-02 LAB — URINALYSIS, COMPLETE
BILIRUBIN UA: NEGATIVE
Glucose, UA: NEGATIVE
Ketones, UA: NEGATIVE
Leukocytes, UA: NEGATIVE
NITRITE UA: NEGATIVE
PH UA: 7 (ref 5.0–7.5)
PROTEIN UA: NEGATIVE
RBC UA: NEGATIVE
Specific Gravity, UA: 1.015 (ref 1.005–1.030)
UUROB: 0.2 mg/dL (ref 0.2–1.0)

## 2018-05-02 LAB — MICROSCOPIC EXAMINATION: RBC MICROSCOPIC, UA: NONE SEEN /HPF (ref 0–2)

## 2018-05-03 NOTE — Progress Notes (Signed)
   05/03/18  CC:  Chief Complaint  Patient presents with  . Cysto    HPI: 58 year old female seen by [facility] with recurrent UTI, pelvic pain and irritative voiding symptoms.  Blood pressure (!) 142/103, pulse 72, height 5\' 2"  (1.575 m), weight 165 lb (74.8 kg). NED. A&Ox3.   No respiratory distress   Abd soft, NT, ND Normal external genitalia with patent urethral meatus  Cystoscopy Procedure Note  Patient identification was confirmed, informed consent was obtained, and patient was prepped using Betadine solution.  Lidocaine jelly was administered per urethral meatus.    Procedure: - Flexible cystoscope introduced, without any difficulty.   - Thorough search of the bladder revealed:    normal urethral meatus    normal urothelium    no stones    no ulcers     no tumors    no urethral polyps    Moderate trabeculation  - Ureteral orifices were normal in position and appearance.  Post-Procedure: - Patient tolerated the procedure well  Assessment/ Plan: No significant abnormalities identified on cystoscopy.  Her symptoms are improved on Myrbetriq however she was unable to take them due to interaction with nortriptyline.  She has a follow-up scheduled with Larene [facility] to discuss other options.   Abbie Sons, MD

## 2018-05-30 NOTE — Progress Notes (Incomplete)
05/31/2018  4:44 PM   Robin Jensen [birth year] [id number]  Referring provider: Donnie Coffin, MD [address], [place] [address]  No chief complaint on file.  HPI: Robin Jensen is a 58 y.o. female Caucasian with presumptive recurrent UTIs, bladder spasms, and mixed incontience who presents today to follow up after her cytoscopy and discuss treatment options.  Presumptive Recurrent UTIs/Bladder spasms/Mixed incontinence Managed by Dr. Clide Deutscher with a daily antibiotic (nitrofuratoin) and Cipro. On her 04/09/2018 visit, she reported that over the past year she had experienced 7 UTIs. She experiences constipation (defecating once a week) and self-treats with Mirilax (1-2x/daily for 40months). She has admited to maintaining good perineal hygiene and avoiding tub baths.  According to records from her PCP, she has had:  + pan-sensitive e.coli on 07/08/2015  + staphylococcus epidermidis and viridans streptococcus resistant to ciprofloxacin, levofloxacin, oxacillin, penicillin and trimethoprim/sulfa on 07/21/2015  + e.coli resistant to ampicillin and piperacillin and beta hemolytic streptococcus, group B on 10/05/2015  + e.coli resistant to ampicillin and cephalothin on 02/24/2016  + cronobacter sakazakii resistant to cefazolin and cefuroxime on 12/14/2016  + klebsiella pneumoniae resistant to ampicillin on 01/30/2017  + staphylococcus epidermidis resistant to ciprofloxacin, penicillin and trimethoprim/sulfa on 04/21/2017  + staphylococcus epidermidis resistant to ciprofloxacin, levofloxacin, penicillin and trimethoprim/sulfa on 05/01/2017  + e.coli resistant to ampicillin on 05/26/2017  + enterobacter cloacae complex resistant to amoxicillin/clavulanic acid, cefazolin and cefuroxime on 08/22/2017  + e.coli resistant to ampicillin on 01/29/2018  + pan-sensitive e.coli on 03/05/2018   The patient is experiencing urgency x *** (***), frequency x *** (***), not/is  restricting fluids to avoid visits to the restroom ***, not/is engaging in toilet mapping, incontinence x *** (***) and nocturia x *** (***).   Her BP is ***.   Her PVR is ***.    {She reports urgency x 4-7 daily, frequency x 0-3 daily, incontinence x 0-3 daily, and nocturia x 0-3 daily. Her symptoms have improved on Myrbetriq with reduced incontinence volume and frequency. She engages in toilet mapping and utilizing fluid management strategies.}  Her 04/09/2018 UA was bland.   On 04/09/2018, she reported drinking an average of 3 glasses of water a day and a cup of coffee every other day.  {At that point, s}he still drinks Diet [facility] frequently (4L daily).  She denied drinking juice, alcohol and tea. She reported that she started smoking again in the middle of 2019.  On her previous (04/09/2018) visit, she was continuing to have bladder spasms that are independent of voiding.  She had not seen improvement of the spasms with Myrbetriq, which she had to stop taking due to it interacting with nortriptyline.  She described that it is a cramping feeling.  The pain was intermittent thrugh most of the day.  She denied any gross hematuria, dysuria, or flank pain.  Patient also denied any fevers, chills, nausea or vomiting.  Her PVR was 0 mL.  On her 05/02/2018 cystoscopy with Dr. Bernardo Heater, no significant abnormalities were identified.  ***  Vaginal Atrophy She reports stabbing pains in her vagina even though she has been using the vaginal estrogen cream MWF.  Her pain is consistent and independent of urination. She is postmenopausal and has had a partial hysterectomy.    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 ON meds   Stroke ([place]) 1997   No deficits   Suicidal ideation    attempted overdoses in past    Surgical History: Past Surgical History:  Procedure Laterality Date   ABDOMINAL HYSTERECTOMY     CEREBRAL ANEURYSM [place]     COLONOSCOPY WITH PROPOFOL N/A 08/20/2012   Procedure: COLONOSCOPY WITH PROPOFOL;  Surgeon: [name], MD;  Location: [facility];  Service: Endoscopy;  Laterality: N/A;  In cecum at [place]; Total Withdrawal Time= 12 minutes   COLONOSCOPY WITH PROPOFOL N/A 09/28/2017   Procedure: COLONOSCOPY WITH PROPOFOL;  Surgeon: [name], MD;  Location[facility];  Service: Endoscopy;  Laterality: N/A;   ESOPHAGOGASTRODUODENOSCOPY (EGD) WITH PROPOFOL N/A 11/20/2016   Procedure: ESOPHAGOGASTRODUODENOSCOPY (EGD) WITH PROPOFOL;  Surgeon: [name], MD;  Location: [facility] ENDOSCOPY;  Service: Endoscopy;  Laterality: N/A;   ESOPHAGOGASTRODUODENOSCOPY (EGD) WITH PROPOFOL N/A 09/28/2017   Procedure: ESOPHAGOGASTRODUODENOSCOPY (EGD) WITH PROPOFOL;  Surgeon: [name], MD;  Location: [facility] ENDOSCOPY;  Service: Endoscopy;  Laterality: N/A;   RADICAL HYSTERECTOMY      Home Medications:  Allergies as of 05/31/2018      Reactions   Bee Venom Anaphylaxis   Penicillins Shortness Of Breath, Rash   Latex    Lisinopril    Other    Sulfa Antibiotics       Medication List       Accurate as of May 30, 2018  4:44 PM. Always use your most recent med list.        atorvastatin 40 MG tablet Commonly known as:  LIPITOR Take 40 mg by mouth at bedtime.   Brexpiprazole 2 MG Tabs Take 2 mg by mouth.   cyclobenzaprine 10 MG tablet Commonly known as:  FLEXERIL Take 10 mg by mouth every 8 (eight) hours as needed for muscle spasms.     diazepam 5 MG tablet Commonly known as:  VALIUM Take 5 mg by mouth at bedtime.   donepezil 10 MG tablet Commonly known as:  ARICEPT Take 10 mg by mouth at bedtime.   EPINEPHrine 0.3 mg/0.3 mL Soaj injection Commonly known as:  EPI-PEN Inject 0.3 mg into the muscle as needed. For anaphylactic reaction   estradiol 0.1 MG/GM vaginal cream Commonly known as:  ESTRACE VAGINAL Apply 0.5mg  (pea-sized amount)  just inside the vaginal introitus with a finger-tip on Monday, Wednesday and Friday nights.   estradiol 0.1 MG/GM vaginal cream Commonly known as:  ESTRACE VAGINAL Apply 0.5mg  (pea-sized amount)  just inside the vaginal introitus with a finger-tip on Monday, Wednesday and Friday nights.   gabapentin 300 MG capsule Commonly known as:  NEURONTIN Take 300 mg by mouth 3 (three) times daily.   labetalol 200 MG tablet Commonly known as:  NORMODYNE Take 200 mg by mouth 2 (two) times daily.   labetalol 300 MG tablet Commonly known as:  NORMODYNE Take 1 tablet (300 mg total) by mouth 2 (two) times  daily.   lamoTRIgine 200 MG tablet Commonly known as:  LAMICTAL Take 300 mg by mouth daily.   levothyroxine 75 MCG tablet Commonly known as:  SYNTHROID, LEVOTHROID Take 1 tablet (75 mcg total) by mouth daily before breakfast.   linaclotide 72 MCG capsule Commonly known as:  LINZESS Take by mouth.   losartan-hydrochlorothiazide 100-25 MG tablet Commonly known as:  HYZAAR Take 1 tablet by mouth daily.   Lurasidone HCl 120 MG Tabs Take by mouth at bedtime.   nortriptyline 25 MG capsule Commonly known as:  PAMELOR Take 25 mg by mouth at bedtime.   pantoprazole 40 MG tablet Commonly known as:  PROTONIX Take 1 tablet (40 mg total) by mouth 2 (two) times daily.   phenytoin 200 MG ER capsule Commonly known as:  DILANTIN Take 400 mg by mouth daily.   potassium chloride 10 MEQ tablet Commonly known as:  K-DUR Take 10 mEq by mouth daily.   tiZANidine 2 MG tablet Commonly  known as:  ZANAFLEX Take 2 mg by mouth 2 (two) times daily.   Verapamil HCl CR 300 MG Cp24 Take 1 capsule (300 mg total) by mouth daily.       Allergies:  Allergies  Allergen Reactions   Bee Venom Anaphylaxis   Penicillins Shortness Of Breath and Rash   Latex    Lisinopril    Other    Sulfa Antibiotics     Family History: Family History  Problem Relation Age of Onset   Colon cancer Father        less than age 60   Colon cancer Paternal Uncle        70s   Colon cancer Paternal Grandfather    Breast cancer Maternal Aunt    Diabetes Sister    Arthritis Brother    Hematuria Mother    Kidney cancer Maternal Grandmother    Kidney disease Maternal Grandmother    Prostate cancer Neg Hx     Social History:  reports that she has been smoking. She has never used smokeless tobacco. She reports that she does not drink alcohol or use drugs.  ROS:                                        Physical Exam: There were no vitals taken for this visit.  Constitutional: Well nourished. Alert and oriented, No acute distress. {HEENT:  AT, moist mucus membranes.  Trachea midline, no masses.} Cardiovascular: No clubbing, cyanosis, or edema. Respiratory: Normal respiratory effort, no increased work of breathing. {GI: Abdomen is soft, non tender, non distended, no abdominal masses. Liver and spleen not palpable.  No hernias appreciated.  Stool sample for occult testing is not indicated.} {GU: No CVA tenderness.  No bladder fullness or masses.  *** external genitalia, *** pubic hair distribution, no lesions.  Normal urethral meatus, no lesions, no prolapse, no discharge.   No urethral masses, tenderness and/or tenderness. No bladder fullness, tenderness or masses. *** vagina mucosa, *** estrogen effect, no discharge, no lesions, *** pelvic support, *** cystocele and *** rectocele noted.  No cervical motion tenderness.  Uterus is freely mobile and non-fixed.   No adnexal/parametria masses or tenderness noted.  Anus and perineum are without rashes or lesions.   *** } Skin: No rashes, bruises or suspicious lesions. {Lymph: No cervical or inguinal adenopathy.} Neurologic: Grossly intact, no focal deficits, moving all 4 extremities. Psychiatric: Normal  mood and affect.   Laboratory Data: Lab Results  Component Value Date   WBC 6.7 10/04/2017   HGB 12.2 10/04/2017   HCT 34.9 (L) 10/04/2017   MCV 84.1 10/04/2017   PLT 208 10/04/2017    Lab Results  Component Value Date   CREATININE 1.25 (H) 10/04/2017  Serum creatinine 1.13 on 01/31/2017   Lab Results  Component Value Date   TSH 1.380 10/04/2017   Lab Results  Component Value Date   AST 33 10/04/2017   Lab Results  Component Value Date   ALT 20 10/04/2017   Urinalysis    Component Value Date/Time   COLORURINE YELLOW (A) 10/05/2017 0430   APPEARANCEUR Clear 05/02/2018 1434   LABSPEC 1.038 (H) 10/05/2017 0430   PHURINE 6.0 10/05/2017 0430   GLUCOSEU Negative 05/02/2018 1434   HGBUR SMALL (A) 10/05/2017 0430   BILIRUBINUR Negative 05/02/2018 1434   KETONESUR NEGATIVE 10/05/2017 0430   PROTEINUR Negative 05/02/2018 1434   PROTEINUR NEGATIVE 10/05/2017 0430   UROBILINOGEN negative 09/27/2012 1120   UROBILINOGEN 0.2 07/14/2010 1552   NITRITE Negative 05/02/2018 1434   NITRITE NEGATIVE 10/05/2017 0430   LEUKOCYTESUR Negative 05/02/2018 1434    I have reviewed the labs.  Pertinent Imaging Results for Robin Jensen, Robin Jensen (MRN [id number]) as of 04/10/2018 08:28  Ref. Range 04/09/2018 14:27  Scan Result Unknown 0 ml    Assessment & Plan:    1. Bladder spasms  - Due to her continued urogenital pain, I offered a cystoscopy. I described to the patient how the procedure is performed and the risk associated with the procedure, such as: Infection, bleeding, uncomfortableness for the first few days after the procedure, the possibility of a biopsy of an area of concern in the ureters  or bladder and the possibility of a stent placement. Patient would like to move forward with a cystoscopy *** - UA bland - will send for culture as she will have a cystoscopy in the future  2. Vaginal Atrophy - Continue vaginal estrogen cream, refill sent to [facility] in [place].   3. Mixed incontinence - Cannot continue the Myrbetriq at this time due to her nortriptyline *** - Will readdress once her cystoscopy is completed   No follow-ups on file.  Laneta Simmers  [facility] [address] [place], [place] [address] [phone]   I, Adele Schilder, am acting as a Education administrator for [employer], PA-C.   {Add Scribe Attestation Statement}

## 2018-05-31 ENCOUNTER — Ambulatory Visit: Payer: Medicare Other | Admitting: Urology

## 2018-06-06 ENCOUNTER — Ambulatory Visit: Payer: Medicare Other | Admitting: Urology

## 2018-06-25 NOTE — Progress Notes (Signed)
06/26/2018  10:38 AM   Robin Jensen March 27, 1961 [id number]  Referring provider: Donnie Coffin, MD [address] [place], [place] [address]  Chief Complaint  Patient presents with   Recurrent UTI   HPI: Robin Jensen is a 58 y.o. Caucasian female presenting today for follow up on rUTI's, OAB and vaginal atrophy.    Recurrent UTIs/Bladder spasms/Mixed incontinence Managed by Dr. Clide Deutscher with a daily antibiotic (nitrofuratoin) and Cipro. Over the past year she reports 7 UTIs. She experiences constipation (defecating once a week) and self-treats with Mirilax (1-2x/daily for 18months). She has admitted to maintaining good perineal hygiene and avoiding tub baths.  She has had 2 documented UTIs since 2017.  On her 03/14/2018 appointment, we had discussed UTI prevention strategies and started a Myrbetriq trial. She reports experiencing depression.  On 04/09/2018, she reported urgency x 4-7 daily, frequency x 0-3 daily, incontinence x 0-3 daily, and nocturia x 0-3 daily. Her symptoms had improved on Myrbetriq with reduced incontinence volume and frequency. She engaged in toilet mapping and utilizing fluid management strategies. UA (04/09/2018) bland.   She drinks an average of 3 glasses of water a day and drinks a cup of coffee every other day. She still drinks Diet [facility] frequently (4L daily).  Denies drinking juice, alcohol and tea. Started smoking again in the middle of 2019.  On her 04/09/2018 visit, she was continuing to have bladder spasms that ware independent of voiding.  She had not seen improvement of the spasms with the Myrbetriq.  She described that as a cramping feeling.  The pain was intermittent through most of the day.  Patient denied any gross hematuria, dysuria or flank pain.  Patient denied any fevers, chills, nausea or vomiting.  Her PVR was 0 mL.  According to records from her PCP: Positive cultures:             + pan-sensitive e.coli on 07/08/2015             + staphylococcus epidermidis and viridans streptococcus resistant to ciprofloxacin, levofloxacin, oxacillin, penicillin and trimethoprim/sulfa on 07/21/2015             + e.coli resistant to ampicillin and piperacillin and beta hemolytic streptococcus, group B on 10/05/2015             + e.coli resistant to ampicillin and cephalothin on 02/24/2016             + cronobacter sakazakii resistant to cefazolin and cefuroxime on 12/14/2016             + klebsiella pneumoniae resistant to ampicillin on 01/30/2017             + staphylococcus epidermidis resistant to ciprofloxacin, penicillin and trimethoprim/sulfa on 04/21/2017             + staphylococcus epidermidis resistant to ciprofloxacin, levofloxacin, penicillin and trimethoprim/sulfa on 05/01/2017             + e.coli resistant to ampicillin on 05/26/2017             + enterobacter cloacae complex resistant to amoxicillin/clavulanic acid, cefazolin and cefuroxime on 08/22/2017             + e.coli resistant to ampicillin on 01/29/2018             + pan-sensitive e.coli on 03/05/2018  Serum creatinine 1.13 on 01/31/2017     Patient underwent cytoscopy on 05/02/2018 with Dr. 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  There was mild trabeculation.  She had had to stop taking Myrbetriq despite its success in improving her symptoms due to interaction with nortriptyline.  The patient is  experiencing urgency x 4-7 (stable), frequency x 4-7 (worsening), is restricting fluids to avoid visits to the restroom >8, is engaging in toilet mapping, incontinence x >8 (worsening) and nocturia x 4-7 (worsening).   Her BP is 150/76.   Her PVR is 2 mL.    Patient says the nortriptyline does not seem effective for her headaches, and she is hoping to be able to discontinue it and resume Myrbetriq.  If she cannot, she is interested in pursuing PTNS.  Vaginal Atrophy She reported on 04/10/2019 that she has continued feeling stabbing pains in her vagina even though she  has been using the vaginal estrogen cream MWF.  Her pain was consistent and independent of urination. She is postmenopausal and has had a partial hysterectomy.  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 ON meds   Stroke ([place]) 1997   No deficits   Suicidal ideation    attempted overdoses in past    Surgical History: Past Surgical History:  Procedure Laterality Date   ABDOMINAL HYSTERECTOMY     CEREBRAL ANEURYSM [place]     COLONOSCOPY WITH PROPOFOL N/A 08/20/2012   Procedure: COLONOSCOPY WITH PROPOFOL;  Surgeon: [name], MD;  Location: [facility];  Service: Endoscopy;  Laterality: N/A;  In cecum at [place]; Total Withdrawal Time= 12 minutes   COLONOSCOPY WITH PROPOFOL N/A 09/28/2017   Procedure: COLONOSCOPY WITH PROPOFOL;  Surgeon: [name], MD;  Location: [facility] ENDOSCOPY;  Service: Endoscopy;  Laterality: N/A;   ESOPHAGOGASTRODUODENOSCOPY (EGD) WITH PROPOFOL N/A 11/20/2016   Procedure: ESOPHAGOGASTRODUODENOSCOPY (EGD) WITH PROPOFOL;  Surgeon: [name], MD;  Location: [facility] ENDOSCOPY;  Service: Endoscopy;  Laterality: N/A;   ESOPHAGOGASTRODUODENOSCOPY (EGD) WITH PROPOFOL N/A 09/28/2017   Procedure: ESOPHAGOGASTRODUODENOSCOPY (EGD) WITH PROPOFOL;  Surgeon: [name], MD;  Location: [facility] ENDOSCOPY;  Service: Endoscopy;  Laterality: N/A;   RADICAL HYSTERECTOMY      Home Medications:  Allergies as of 06/26/2018      Reactions   Bee Venom Anaphylaxis    Penicillins Shortness Of Breath, Rash   Latex    Lisinopril    Other    Sulfa Antibiotics       Medication List       Accurate as of June 26, 2018 10:38 AM. Always use your most recent med list.        atorvastatin 40 MG tablet Commonly known as:  LIPITOR Take 40 mg by mouth at bedtime.   Brexpiprazole 2 MG Tabs Take 2 mg by mouth.   cyclobenzaprine 10 MG tablet Commonly known as:  FLEXERIL Take 10 mg by mouth every 8 (eight)  hours as needed for muscle spasms.   diazepam 5 MG tablet Commonly known as:  VALIUM Take 5 mg by mouth at bedtime.   donepezil 10 MG tablet Commonly known as:  ARICEPT Take 10 mg by mouth at bedtime.   EPINEPHrine 0.3 mg/0.3 mL Soaj injection Commonly known as:  EPI-PEN Inject 0.3 mg into the muscle as needed. For anaphylactic reaction   estradiol 0.1 MG/GM vaginal cream Commonly known as:  ESTRACE VAGINAL Apply 0.5mg  (pea-sized amount)  just inside the vaginal introitus with a finger-tip on Monday, Wednesday and Friday nights.   gabapentin 300 MG capsule Commonly known as:  NEURONTIN Take 300 mg by mouth 3 (three) times daily.   labetalol 200 MG tablet Commonly known as:  NORMODYNE Take 200 mg by mouth 2 (two) times daily.   labetalol 300 MG tablet Commonly known as:  NORMODYNE Take 1 tablet (300 mg total) by mouth 2 (two) times daily.   lamoTRIgine 200 MG tablet Commonly known as:  LAMICTAL Take 300 mg by mouth daily.   levothyroxine 75 MCG tablet Commonly known as:  SYNTHROID, LEVOTHROID Take 1 tablet (75 mcg total) by mouth daily before breakfast.   linaclotide 72 MCG capsule Commonly known as:  LINZESS Take by mouth.   losartan-hydrochlorothiazide 100-25 MG tablet Commonly known as:  HYZAAR Take 1 tablet by mouth daily.   Lurasidone HCl 120 MG Tabs Take by mouth at bedtime.   nortriptyline 25 MG capsule Commonly known as:  PAMELOR Take 25 mg by mouth at bedtime.   pantoprazole 40 MG tablet Commonly known as:   PROTONIX Take 1 tablet (40 mg total) by mouth 2 (two) times daily.   phenytoin 200 MG ER capsule Commonly known as:  DILANTIN Take 400 mg by mouth daily.   potassium chloride 10 MEQ tablet Commonly known as:  K-DUR Take 10 mEq by mouth daily.   tiZANidine 2 MG tablet Commonly known as:  ZANAFLEX Take 2 mg by mouth 2 (two) times daily.   Verapamil HCl CR 300 MG Cp24 Take 1 capsule (300 mg total) by mouth daily.       Allergies:  Allergies  Allergen Reactions   Bee Venom Anaphylaxis   Penicillins Shortness Of Breath and Rash   Latex    Lisinopril    Other    Sulfa Antibiotics     Family History: Family History  Problem Relation Age of Onset   Colon cancer Father        less than age 73   Colon cancer Paternal Uncle        71s   Colon cancer Paternal Grandfather    Breast cancer Maternal Aunt    Diabetes Sister    Arthritis Brother    Hematuria Mother    Kidney cancer Maternal Grandmother    Kidney disease Maternal Grandmother    Prostate cancer Neg Hx     Social History:  reports that she has been smoking. She has never used smokeless tobacco. She reports that she does not drink alcohol or use drugs.  ROS: UROLOGY Frequent Urination?: Yes Hard to postpone urination?: No Burning/pain with urination?: No Get up at night to urinate?: Yes Leakage of urine?: Yes Urine stream starts and stops?: No Trouble starting stream?: No Do you have to strain to urinate?: No Blood in urine?: No Urinary tract infection?: No Sexually transmitted disease?: No Injury to kidneys or bladder?: No Painful intercourse?: No Weak stream?: No Currently pregnant?: No Vaginal bleeding?: No Last menstrual period?:  n  Gastrointestinal Nausea?: Yes Vomiting?: Yes Indigestion/heartburn?: No Diarrhea?: No Constipation?: No  Constitutional Fever: No Night sweats?: Yes Weight loss?: No Fatigue?: Yes  Skin Skin rash/lesions?: No Itching?:  No  Eyes Blurred vision?: No Double vision?: No  Ears/Nose/Throat Sore throat?: No Sinus problems?: No  Hematologic/Lymphatic Swollen glands?: No Easy bruising?: No  Cardiovascular Leg swelling?: No Chest pain?: No  Respiratory Cough?: No Shortness of breath?: No  Endocrine Excessive thirst?: Yes  Musculoskeletal Back pain?: Yes Joint pain?: No  Neurological Headaches?: Yes Dizziness?: No  Psychologic Depression?: Yes Anxiety?: Yes  Physical Exam: BP (!) 150/76 (BP Location: Left Arm, Patient Position: Sitting)    Pulse 61    Ht 5\' 1"  (1.549 m)    Wt 160 lb (72.6 kg)    BMI 30.23 kg/m   Constitutional: Well nourished. Alert and oriented, No acute distress. Cardiovascular: No clubbing, cyanosis, or edema. Respiratory: Normal respiratory effort, no increased work of breathing. Skin: No rashes, bruises or suspicious lesions. Neurologic: Grossly intact, no focal deficits, moving all 4 extremities. Psychiatric: Normal mood and affect.   Laboratory Data: Lab Results  Component Value Date   WBC 6.7 10/04/2017   HGB 12.2 10/04/2017   HCT 34.9 (L) 10/04/2017   MCV 84.1 10/04/2017   PLT 208 10/04/2017    Lab Results  Component Value Date   CREATININE 1.25 (H) 10/04/2017   Lab Results  Component Value Date   HGBA1C 5.2 10/04/2017    Lab Results  Component Value Date   TSH 1.380 10/04/2017   Lab Results  Component Value Date   AST 33 10/04/2017   Lab Results  Component Value Date   ALT 20 10/04/2017   Urinalysis    Component Value Date/Time   COLORURINE YELLOW (A) 10/05/2017 0430   APPEARANCEUR Clear 05/02/2018 1434   LABSPEC 1.038 (H) 10/05/2017 0430   PHURINE 6.0 10/05/2017 0430   GLUCOSEU Negative 05/02/2018 1434   HGBUR SMALL (A) 10/05/2017 0430   BILIRUBINUR Negative 05/02/2018 1434   KETONESUR NEGATIVE 10/05/2017 0430   PROTEINUR Negative 05/02/2018 1434   PROTEINUR NEGATIVE 10/05/2017 0430   UROBILINOGEN negative 09/27/2012 1120    UROBILINOGEN 0.2 07/14/2010 1552   NITRITE Negative 05/02/2018 1434   NITRITE NEGATIVE 10/05/2017 0430   LEUKOCYTESUR Negative 05/02/2018 1434    I have reviewed the labs.  Assessment & Plan:    1. Bladder spasms  - NED on cystoscopy in 05/02/2018  2. Vaginal Atrophy - Continue vaginal estrogen cream, refill sent to [facility] in [place].  3. Mixed incontinence - Cannot continue the Myrbetriq at this time due to her nortriptyline - Patient will check with neurologist on visit later this month to see if she can discontinue nortriptyline as Myrbetriq had been effective - Discussed alternate treatments as well, including PTNS; as it has yet to be determined if she will be able to discontinue nortriptyline, she is interested in pursuing PTNS - Explained the PTNS provides treatment by indirectly providing electrical stimulation to the nerves responsible for bladder and pelvic floor function - a needle electrode generates an adjustable electrical pulse that travels to the sacral plexus via the tibial nerve which is located in the ankle, among other functions, the sacral nerve plexus regulates bladder and pelvic floor function - treatment protocol requires once-a-week treatments for 12 weeks, 30 minutes per session and many patients begin to see improvements by the 6th treatment. Patients who respond to treatment may require occasional treatments (~ once every 3 weeks) to sustain  improvements. PTNS is a low-risk procedure. The most common side-effects with PTNS treatment are temporary and minor, resulting from the placement of the needle electrode. They include minor bleeding, mild pain and skin inflammation and patients have seen up to an 80% success rate with this form of treatment - Office will check with patient's insurance regarding coverage on PTNS - Course of treatment will be decided once it is known if she will be continuing nortriptyline  Return for please check with insurance for PTNS  .  Laneta Simmers   [facility] [address] [place], [place] [address] [phone]   I, Adele Schilder, am acting as a Education administrator for [employer], PA-C.   I have reviewed the above documentation for accuracy and completeness, and I agree with the above.    Zara Council, PA-C  I spent 15 with this patient in a face to face conversation of which greater than 50% was spent in counseling and coordination of care with the patient.

## 2018-06-26 ENCOUNTER — Encounter: Payer: Self-pay | Admitting: Urology

## 2018-06-26 ENCOUNTER — Other Ambulatory Visit: Payer: Self-pay

## 2018-06-26 ENCOUNTER — Ambulatory Visit (INDEPENDENT_AMBULATORY_CARE_PROVIDER_SITE_OTHER): Payer: Medicare Other | Admitting: Urology

## 2018-06-26 VITALS — BP 150/76 | HR 61 | Ht 61.0 in | Wt 160.0 lb

## 2018-06-26 DIAGNOSIS — N390 Urinary tract infection, site not specified: Secondary | ICD-10-CM

## 2018-06-26 DIAGNOSIS — N3281 Overactive bladder: Secondary | ICD-10-CM | POA: Diagnosis not present

## 2018-06-26 DIAGNOSIS — N952 Postmenopausal atrophic vaginitis: Secondary | ICD-10-CM | POA: Diagnosis not present

## 2018-06-26 LAB — BLADDER SCAN AMB NON-IMAGING

## 2018-06-26 MED ORDER — ESTRADIOL 0.1 MG/GM VA CREA: TOPICAL_CREAM | VAGINAL | 12 refills | Status: DC

## 2018-07-18 ENCOUNTER — Ambulatory Visit: Payer: Medicare Other

## 2018-07-25 ENCOUNTER — Ambulatory Visit: Payer: Medicare Other

## 2018-08-01 ENCOUNTER — Ambulatory Visit: Payer: Medicare Other

## 2018-08-08 ENCOUNTER — Ambulatory Visit: Payer: Medicare Other

## 2018-08-15 ENCOUNTER — Ambulatory Visit: Payer: Medicare Other

## 2018-08-22 ENCOUNTER — Ambulatory Visit: Payer: Medicare Other

## 2018-08-29 ENCOUNTER — Ambulatory Visit: Payer: Medicare Other

## 2018-09-05 ENCOUNTER — Ambulatory Visit: Payer: Medicare Other

## 2018-09-12 ENCOUNTER — Ambulatory Visit: Payer: Medicare Other

## 2018-09-18 ENCOUNTER — Telehealth: Payer: Self-pay | Admitting: Urology

## 2018-09-18 NOTE — Telephone Encounter (Signed)
Robin Jensen was not able to discontinue her nortriptyline, so she cannot continue the Myrbetriq.  Would you check to see if her insurance will cover PTNS?

## 2018-09-19 ENCOUNTER — Ambulatory Visit: Payer: Medicare Other

## 2018-09-24 NOTE — Telephone Encounter (Signed)
She has already been approved we had to cancel her PTNS appts until after the COVID  We are able to get her reschd and I will be working on getting her back on the schedule in July when we start back up.    Sharyn Lull

## 2018-09-26 ENCOUNTER — Ambulatory Visit: Payer: Medicare Other

## 2018-10-03 ENCOUNTER — Ambulatory Visit: Payer: Medicare Other

## 2018-10-10 ENCOUNTER — Ambulatory Visit: Payer: Medicare Other

## 2018-10-14 ENCOUNTER — Ambulatory Visit: Payer: Medicare Other

## 2018-10-17 ENCOUNTER — Ambulatory Visit: Payer: Medicare Other

## 2018-10-21 ENCOUNTER — Encounter: Payer: Self-pay | Admitting: Urology

## 2018-10-21 ENCOUNTER — Ambulatory Visit: Payer: Medicare Other

## 2018-10-24 ENCOUNTER — Ambulatory Visit: Payer: Medicare Other

## 2018-10-28 ENCOUNTER — Ambulatory Visit: Payer: Medicare Other

## 2018-10-28 ENCOUNTER — Encounter: Payer: Self-pay | Admitting: Urology

## 2018-10-31 ENCOUNTER — Ambulatory Visit: Payer: Medicare Other

## 2018-11-04 ENCOUNTER — Ambulatory Visit: Payer: Medicare Other

## 2018-11-11 ENCOUNTER — Ambulatory Visit: Payer: Medicare Other

## 2018-11-18 ENCOUNTER — Ambulatory Visit: Payer: Medicare Other

## 2018-11-25 ENCOUNTER — Ambulatory Visit: Payer: Medicare Other

## 2018-12-02 ENCOUNTER — Ambulatory Visit: Payer: Medicare Other

## 2018-12-09 ENCOUNTER — Ambulatory Visit: Payer: Medicare Other

## 2018-12-13 ENCOUNTER — Ambulatory Visit: Payer: Medicare Other

## 2018-12-16 ENCOUNTER — Ambulatory Visit: Payer: Medicare Other

## 2018-12-20 ENCOUNTER — Ambulatory Visit: Payer: Medicare Other

## 2018-12-23 ENCOUNTER — Ambulatory Visit: Payer: Medicare Other

## 2018-12-27 ENCOUNTER — Ambulatory Visit: Payer: Medicare Other

## 2019-01-01 ENCOUNTER — Ambulatory Visit: Payer: Medicare Other

## 2019-01-03 ENCOUNTER — Ambulatory Visit: Payer: Medicare Other

## 2019-01-06 ENCOUNTER — Ambulatory Visit: Payer: Medicare Other

## 2019-01-10 ENCOUNTER — Ambulatory Visit: Payer: Medicare Other

## 2019-01-17 ENCOUNTER — Ambulatory Visit: Payer: Medicare Other

## 2019-01-24 ENCOUNTER — Ambulatory Visit: Payer: Medicare Other

## 2019-01-30 NOTE — Progress Notes (Signed)
06/26/2018  10:21 AM   Robin Jensen [birth year] [id number]  Referring provider: Donnie Coffin, MD [place],  [place] [address]  Chief Complaint  Patient presents with  . Robin Jensen    follow up   HPI: Robin Jensen is a 58 y.o. female presenting today for referral for rUTI's, OAB and vaginal atrophy by Dr. Clide Deutscher.      Robin UTIs/Bladder spasms Risk factors: age, menopausal state, incontinence and constipation  Patient underwent cytoscopy on 05/02/2018 with Dr. 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  There was mild trabeculation.   Her symptoms consist of a foul smelling urine and increased frequency.    Today, she is experiencing frequency, dysuria, nocturia and incontinence. She states she was recently on an antibiotic for two weeks recently and she feels like her infection has returned.  Her CATH UA is nitrite positive, 11-30 WBC's with many bacteria.  Her PVR 125 mL.    OAB She had had to stop taking Myrbetriq despite its success in improving her symptoms due to interaction with nortriptyline.  She drinks an average of 2 glasses of water a day and drinks a cup of coffee every other day. She has decreased her Diet Mountain Dew intake from 4L daily to 1L daily.  Denies drinking juice, alcohol and tea. Started smoking again in the middle of 2019.  The patient is  experiencing urgency x 4-7 (stable), frequency x 4-7 (stable), is restricting fluids to avoid visits to the restroom, is engaging in toilet mapping, incontinence x 4-7 (improved) and nocturia x 0-3 (improved).   She has not discontinue the Pamelor, so she would like to see if she can start the PTNS.    Vaginal Atrophy Using the cream three nights weekly  PMH: Past Medical History:  Diagnosis Date  . 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 Anxiety   . Bipolar 1 disorder ([place])   . Cancer ([place])    uterine  . Depression   . Dry mouth   . DVT of leg (deep venous thrombosis) ([facility])    history of   . Easy bruising   . Eye dryness   . Fatigue   . GERD (gastroesophageal reflux disease)   . Hyperlipidemia   . Hypertension   . Hypothyroid   . Migraine   . Migraine   . Seizure disorder ([place])   . Seizures ([place])    last year last seizure-had brain aneurysm. ON meds  . Stroke ([place]) 1997   No deficits  . Suicidal ideation    attempted overdoses in past    Surgical History: Past Surgical History:  Procedure Laterality Date  . ABDOMINAL HYSTERECTOMY    . CEREBRAL ANEURYSM REPAIR  1997  . CESAREAN SECTION    . CHOLECYSTECTOMY    . COLONOSCOPY WITH PROPOFOL N/A 08/20/2012   Procedure: COLONOSCOPY WITH PROPOFOL;  Surgeon: [name], MD;  Location: [facility];  Service: Endoscopy;  Laterality: N/A;  In cecum at [place]; Total Withdrawal Time= 12 minutes  . COLONOSCOPY WITH PROPOFOL N/A 09/28/2017   Procedure: COLONOSCOPY WITH PROPOFOL;  Surgeon: [name], MD;  Location: [facility] ENDOSCOPY;  Service: Endoscopy;  Laterality: N/A;  . ESOPHAGOGASTRODUODENOSCOPY (EGD) WITH PROPOFOL N/A 11/20/2016   Procedure: ESOPHAGOGASTRODUODENOSCOPY (EGD) WITH PROPOFOL;  Surgeon: [name], MD;  Location: [facility] ENDOSCOPY;  Service: Endoscopy;  Laterality: N/A;  . ESOPHAGOGASTRODUODENOSCOPY (EGD) WITH PROPOFOL N/A 09/28/2017   Procedure: ESOPHAGOGASTRODUODENOSCOPY (EGD) WITH PROPOFOL;  Surgeon:  [name], MD;  Location: [facility] ENDOSCOPY;  Service: Endoscopy;  Laterality: N/A;  . RADICAL HYSTERECTOMY      Home Medications:  Allergies as of 01/31/2019      Reactions   Bee Venom Anaphylaxis   Penicillins Shortness Of Breath, Rash   Latex    Lisinopril    Other    Sulfa Antibiotics       Medication List       Accurate as of January 31, 2019 11:59 PM. If you have any questions, ask your nurse or doctor.        atorvastatin 40 MG tablet Commonly known as: LIPITOR Take 40 mg by mouth at bedtime.   brexpiprazole 2 MG Tabs tablet Commonly known as: REXULTI Take 2 mg by mouth.    cyclobenzaprine 10 MG tablet Commonly known as: FLEXERIL Take 10 mg by mouth every 8 (eight) hours as needed for muscle spasms.   diazepam 5 MG tablet Commonly known as: VALIUM Take 5 mg by mouth at bedtime.   donepezil 10 MG tablet Commonly known as: ARICEPT Take 10 mg by mouth at bedtime.   EPINEPHrine 0.3 mg/0.3 mL Soaj injection Commonly known as: EPI-PEN Inject 0.3 mg into the muscle as needed. For anaphylactic reaction   estradiol 0.1 MG/GM vaginal cream Commonly known as: ESTRACE VAGINAL Apply 0.5mg  (pea-sized amount)  just inside the vaginal introitus with a finger-tip on Monday, Wednesday and Friday nights.   gabapentin 300 MG capsule Commonly known as: NEURONTIN Take 300 mg by mouth 3 (three) times daily.   labetalol 200 MG tablet Commonly known as: NORMODYNE Take 200 mg by mouth 2 (two) times daily.   labetalol 300 MG tablet Commonly known as: NORMODYNE Take 1 tablet (300 mg total) by mouth 2 (two) times daily.   lamoTRIgine 200 MG tablet Commonly known as: LAMICTAL Take 300 mg by mouth daily.   levothyroxine 75 MCG tablet Commonly known as: SYNTHROID Take 1 tablet (75 mcg total) by mouth daily before breakfast.   linaclotide 72 MCG capsule Commonly known as: LINZESS Take by mouth.   losartan-hydrochlorothiazide 100-25 MG tablet Commonly known as: HYZAAR Take 1 tablet by mouth daily.   Lurasidone HCl 120 MG Tabs Take by mouth at bedtime.   nortriptyline 25 MG capsule Commonly known as: PAMELOR Take 25 mg by mouth at bedtime.   pantoprazole 40 MG tablet Commonly known as: PROTONIX Take 1 tablet (40 mg total) by mouth 2 (two) times daily.   phenytoin 200 MG ER capsule Commonly known as: DILANTIN Take 400 mg by mouth daily.   potassium chloride 10 MEQ tablet Commonly known as: KLOR-CON Take 10 mEq by mouth daily.   tiZANidine 2 MG tablet Commonly known as: ZANAFLEX Take 2 mg by mouth 2 (two) times daily.   Verapamil HCl CR 300 MG Cp24  Take 1 capsule (300 mg total) by mouth daily. What changed: when to take this       Allergies:  Allergies  Allergen Reactions  . Bee Venom Anaphylaxis  . Penicillins Shortness Of Breath and Rash  . Latex   . Lisinopril   . Other   . Sulfa Antibiotics     Family History: Family History  Problem Relation Age of Onset  . Colon cancer Father        less than age 43  . Colon cancer Paternal Uncle        63s  . Colon cancer Paternal Grandfather   . Breast cancer Maternal Aunt   .  Diabetes Sister   . Arthritis Brother   . Hematuria Mother   . Kidney cancer Maternal Grandmother   . Kidney disease Maternal Grandmother   . Prostate cancer Neg Hx     Social History:  reports that she has been smoking. She has never used smokeless tobacco. She reports that she does not drink alcohol or use drugs.  ROS: UROLOGY Frequent Urination?: Yes Hard to postpone urination?: No Burning/pain with urination?: Yes Get up at night to urinate?: Yes Leakage of urine?: Yes Urine stream starts and stops?: No Trouble starting stream?: No Do you have to strain to urinate?: No Blood in urine?: No Urinary tract infection?: Yes Sexually transmitted disease?: No Injury to kidneys or bladder?: No Painful intercourse?: No Weak stream?: No Currently pregnant?: No Vaginal bleeding?: No Last menstrual period?: n  Gastrointestinal Nausea?: No Vomiting?: No Indigestion/heartburn?: Yes Diarrhea?: No Constipation?: No  Constitutional Fever: No Night sweats?: Yes Weight loss?: No Fatigue?: Yes  Skin Skin rash/lesions?: No Itching?: No  Eyes Blurred vision?: No Double vision?: No  Ears/Nose/Throat Sore throat?: No Sinus problems?: No  Hematologic/Lymphatic Swollen glands?: No Easy bruising?: No  Cardiovascular Leg swelling?: No Chest pain?: No  Respiratory Cough?: No Shortness of breath?: No  Endocrine Excessive thirst?: No  Musculoskeletal Back pain?: No Joint  pain?: No  Neurological Headaches?: Yes Dizziness?: No  Psychologic Depression?: Yes Anxiety?: Yes  Physical Exam: BP 116/83   Pulse 80   Ht 5\' 1"  (1.549 m)   Wt 179 lb (81.2 kg)   BMI 33.82 kg/m   Constitutional:  Well nourished. Alert and oriented, No acute distress. HEENT: [place] AT, moist mucus membranes.  Trachea midline, no masses. Cardiovascular: No clubbing, cyanosis, or edema. Respiratory: Normal respiratory effort, no increased work of breathing. GI: Abdomen is soft, non tender, non distended, no abdominal masses. Liver and spleen not palpable.  No hernias appreciated.  Stool sample for occult testing is not indicated.   GU: No CVA tenderness.  No bladder fullness or masses.  Atrophic external genitalia, sparse pubic hair distribution, no lesions.  Normal urethral meatus, no lesions, no prolapse, no discharge.   No urethral masses, tenderness and/or tenderness. No bladder fullness, tenderness or masses. normal vagina mucosa, good estrogen effect, no discharge, no lesions, good pelvic support, grade I cystocele and no rectocele noted. Uterus is surgically absent.  No adnexal/parametria masses or tenderness noted.  Anus and perineum are without rashes or lesions.    Skin: No rashes, bruises or suspicious lesions. Lymph: No cervical or inguinal adenopathy. Neurologic: Grossly intact, no focal deficits, moving all 4 extremities. Psychiatric: Normal mood and affect.   Laboratory Data: Lab Results  Component Value Date   WBC 6.7 10/04/2017   HGB 12.2 10/04/2017   HCT 34.9 (L) 10/04/2017   MCV 84.1 10/04/2017   PLT 208 10/04/2017    Lab Results  Component Value Date   CREATININE 1.25 (H) 10/04/2017   Lab Results  Component Value Date   HGBA1C 5.2 10/04/2017    Lab Results  Component Value Date   TSH 1.380 10/04/2017   Lab Results  Component Value Date   AST 33 10/04/2017   Lab Results  Component Value Date   ALT 20 10/04/2017   Urinalysis Component      Latest Ref Rng & Units 01/31/2019  Specific Gravity, UA     1.005 - 1.030 1.010  pH, UA     5.0 - 7.5 5.5  Color, UA     Yellow  Yellow  Appearance Ur     Clear Cloudy (A)  Leukocytes,UA     Negative 1+ (A)  Protein,UA     Negative/Trace Negative  Glucose, UA     Negative Negative  Ketones, UA     Negative Negative  RBC, UA     Negative Trace (A)  Bilirubin, UA     Negative Negative  Urobilinogen, Ur     0.2 - 1.0 mg/dL 0.2  Nitrite, UA     Negative Positive (A)  Microscopic Examination      See below:   Component     Latest Ref Rng & Units 01/31/2019  WBC, UA     0 - 5 /hpf 11-30 (A)  RBC     0 - 2 /hpf 0-2  Epithelial Cells (non renal)     0 - 10 /hpf 0-10  Bacteria, UA     None seen/Few Many (A)    I have reviewed the labs.  Assessment & Plan:    1. rUTI's Patient is instructed to increase their water intake until the urine is pale yellow or clear (10 to 12 cups daily)  Patient is instructed to take cranberry pills Avoid soaking in tubs and wipe front to back after urinating  Advised them to have CATH UA's for urinalysis and culture to prevent skin, vaginal and/or rectal contamination of the specimen Obtain a RUS to evaluate upper tract for nidus for infections                                         2. Vaginal Atrophy Continue applying vaginal estrogen cream three nights weekly - refill sent to pharmacy   3. Mixed incontinence Cannot continue the Myrbetriq at this time due to her nortriptyline Wants to be rescheduled for PTNS  Return for please schedule PTNS treatments .  Laneta Simmers   [facility] [address], [place], [place] [address] [phone]

## 2019-01-31 ENCOUNTER — Encounter: Payer: Self-pay | Admitting: Urology

## 2019-01-31 ENCOUNTER — Other Ambulatory Visit: Payer: Self-pay

## 2019-01-31 ENCOUNTER — Ambulatory Visit: Payer: Medicare Other

## 2019-01-31 ENCOUNTER — Ambulatory Visit (INDEPENDENT_AMBULATORY_CARE_PROVIDER_SITE_OTHER): Payer: Medicare Other | Admitting: Urology

## 2019-01-31 VITALS — BP 116/83 | HR 80 | Ht 61.0 in | Wt 179.0 lb

## 2019-01-31 DIAGNOSIS — N3946 Mixed incontinence: Secondary | ICD-10-CM

## 2019-01-31 DIAGNOSIS — N952 Postmenopausal atrophic vaginitis: Secondary | ICD-10-CM | POA: Diagnosis not present

## 2019-01-31 DIAGNOSIS — N390 Urinary tract infection, site not specified: Secondary | ICD-10-CM

## 2019-01-31 LAB — URINALYSIS, COMPLETE
Bilirubin, UA: NEGATIVE
Glucose, UA: NEGATIVE
Ketones, UA: NEGATIVE
Nitrite, UA: POSITIVE — AB
Protein,UA: NEGATIVE
Specific Gravity, UA: 1.01 (ref 1.005–1.030)
Urobilinogen, Ur: 0.2 mg/dL (ref 0.2–1.0)
pH, UA: 5.5 (ref 5.0–7.5)

## 2019-01-31 LAB — MICROSCOPIC EXAMINATION

## 2019-01-31 MED ORDER — ESTRADIOL 0.1 MG/GM VA CREA: TOPICAL_CREAM | VAGINAL | 12 refills | Status: DC

## 2019-01-31 NOTE — Progress Notes (Signed)
In and Out Catheterization  Patient is present today for a I & O catheterization due to recurrent UTI. Patient was cleaned and prepped in a sterile fashion with betadine and Lidocaine 2% jelly was instilled into the urethra.  A 14FR cath was inserted no complications were noted , 170ml of urine return was noted, urine was yellow in color. A clean urine sample was collected for UA. Bladder was drained  And catheter was removed with out difficulty.    Preformed by: Fonnie Jarvis, CMA

## 2019-02-03 ENCOUNTER — Telehealth: Payer: Self-pay

## 2019-02-03 LAB — CULTURE, URINE COMPREHENSIVE

## 2019-02-03 MED ORDER — NITROFURANTOIN MONOHYD MACRO 100 MG PO CAPS: 100.0000 mg | ORAL_CAPSULE | Freq: Two times a day (BID) | ORAL | 0 refills | Status: DC

## 2019-02-03 NOTE — Telephone Encounter (Signed)
Patient aware of results and macrobid sent to pharmacy.

## 2019-02-07 ENCOUNTER — Ambulatory Visit: Payer: Medicare Other

## 2019-02-14 ENCOUNTER — Ambulatory Visit: Payer: Medicare Other

## 2019-02-21 ENCOUNTER — Ambulatory Visit: Payer: Medicare Other

## 2019-02-28 ENCOUNTER — Ambulatory Visit: Payer: Medicare Other

## 2019-03-07 ENCOUNTER — Ambulatory Visit: Payer: Medicare Other

## 2019-03-10 ENCOUNTER — Ambulatory Visit: Payer: Medicare Other | Admitting: Physician Assistant

## 2019-03-10 ENCOUNTER — Ambulatory Visit: Payer: Self-pay | Admitting: Physician Assistant

## 2019-03-17 ENCOUNTER — Ambulatory Visit: Payer: Medicare Other

## 2019-03-17 ENCOUNTER — Ambulatory Visit: Payer: Self-pay | Admitting: Physician Assistant

## 2019-03-17 ENCOUNTER — Encounter: Payer: Self-pay | Admitting: Physician Assistant

## 2019-03-24 ENCOUNTER — Ambulatory Visit: Payer: Self-pay | Admitting: Physician Assistant

## 2019-03-24 ENCOUNTER — Encounter: Payer: Self-pay | Admitting: Physician Assistant

## 2019-03-24 ENCOUNTER — Ambulatory Visit: Payer: Medicare Other

## 2019-03-31 ENCOUNTER — Ambulatory Visit: Payer: Medicare Other

## 2019-03-31 ENCOUNTER — Ambulatory Visit: Payer: Self-pay | Admitting: Physician Assistant

## 2019-03-31 ENCOUNTER — Telehealth: Payer: Self-pay | Admitting: Physician Assistant

## 2019-03-31 ENCOUNTER — Encounter: Payer: Self-pay | Admitting: Physician Assistant

## 2019-03-31 NOTE — Telephone Encounter (Signed)
Pt called office asking where she went to get her U/S.  She said she received a letter from our office stating she needed to go have an u/s and didn't need an appt.  I told her the U/S was from 10/8 and since she wasn't coming for her PTNS, she wouldn't need u/s per Sharyn Lull.  Pt said she gets her days and times messed up, but she'll be here next Monday.

## 2019-04-07 ENCOUNTER — Ambulatory Visit: Payer: Medicare Other

## 2019-04-14 ENCOUNTER — Other Ambulatory Visit: Payer: Self-pay | Admitting: Acute Care

## 2019-04-14 ENCOUNTER — Ambulatory Visit: Payer: Medicare Other

## 2019-04-14 ENCOUNTER — Ambulatory Visit: Payer: Self-pay | Admitting: Physician Assistant

## 2019-04-14 DIAGNOSIS — G44229 Chronic tension-type headache, not intractable: Secondary | ICD-10-CM

## 2019-04-21 ENCOUNTER — Ambulatory Visit: Payer: Self-pay | Admitting: Physician Assistant

## 2019-04-21 ENCOUNTER — Ambulatory Visit: Payer: Medicare Other

## 2019-04-24 ENCOUNTER — Ambulatory Visit: Payer: Medicare Other

## 2019-04-28 ENCOUNTER — Other Ambulatory Visit: Payer: Self-pay

## 2019-04-28 ENCOUNTER — Ambulatory Visit
Admission: RE | Admit: 2019-04-28 | Discharge: 2019-04-28 | Disposition: A | Discharge status: Home / Self Care | Payer: Medicare Other | Source: Ambulatory Visit | Attending: Acute Care | Admitting: Acute Care

## 2019-04-28 ENCOUNTER — Ambulatory Visit: Payer: Self-pay | Admitting: Physician Assistant

## 2019-04-28 ENCOUNTER — Ambulatory Visit: Payer: Medicare Other

## 2019-04-28 DIAGNOSIS — G44229 Chronic tension-type headache, not intractable: Secondary | ICD-10-CM | POA: Diagnosis present

## 2019-05-05 ENCOUNTER — Ambulatory Visit: Payer: Medicare Other

## 2019-05-05 ENCOUNTER — Ambulatory Visit: Payer: Self-pay | Admitting: Physician Assistant

## 2019-05-12 ENCOUNTER — Ambulatory Visit: Payer: Self-pay | Admitting: Physician Assistant

## 2019-05-19 ENCOUNTER — Ambulatory Visit: Payer: Medicare Other

## 2019-05-19 ENCOUNTER — Ambulatory Visit: Payer: Self-pay | Admitting: Physician Assistant

## 2019-05-26 ENCOUNTER — Ambulatory Visit: Payer: Self-pay | Admitting: Physician Assistant

## 2019-05-26 ENCOUNTER — Ambulatory Visit: Payer: Medicare Other

## 2019-06-02 ENCOUNTER — Ambulatory Visit: Payer: Medicare Other

## 2019-06-26 IMAGING — CT CT HEAD W/O CM
3 series · 15 of 47 positions shown, 18 images · non-contrast
Comparison: Prior CT from 08/08/2017.

CLINICAL DATA: Initial evaluation for increase falls with
stuttering speech, blurry vision.

EXAM:
CT HEAD WITHOUT CONTRAST
TECHNIQUE: Contiguous axial images were obtained from the base of the skull
through the vertex without intravenous contrast.

[Series 2: head wo · axial · 0.39mm/px · z∈[-55,+70]mm · 9 of 30 slices shown, 12 images]
[im 3/30  brain]
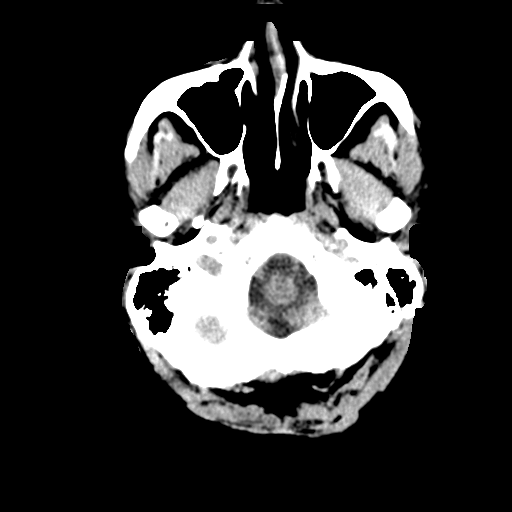
[im 3/30  bone]
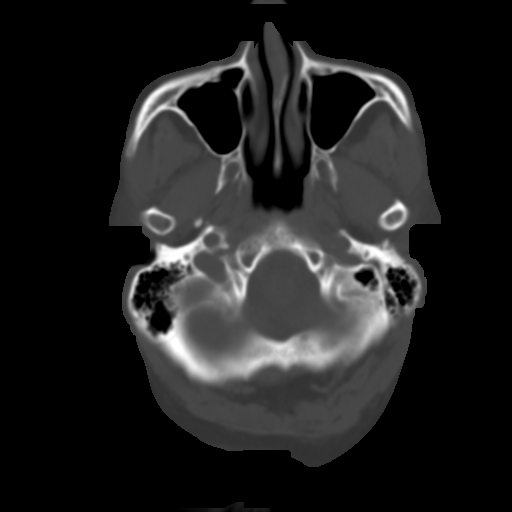
[im 6/30  brain]
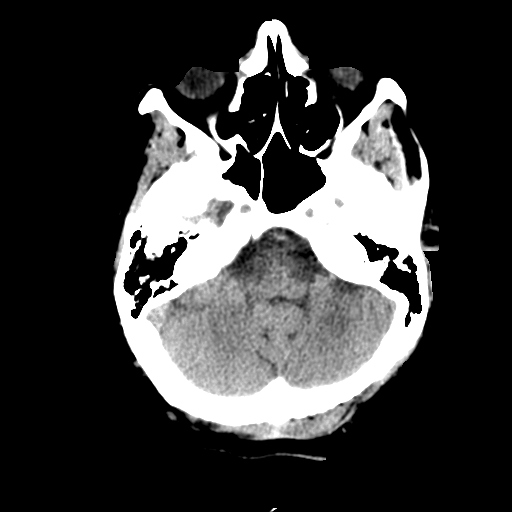
[im 9/30  brain]
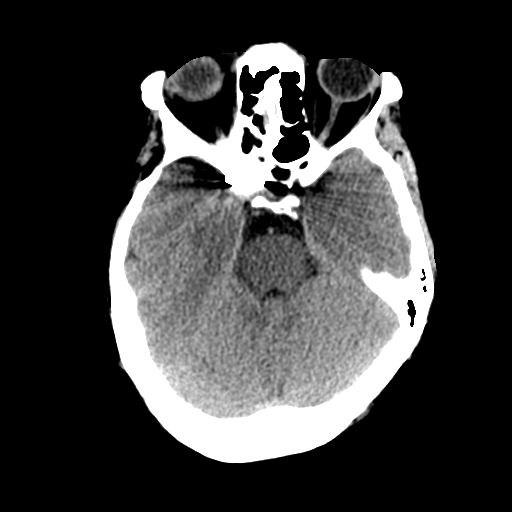
[im 12/30  brain]
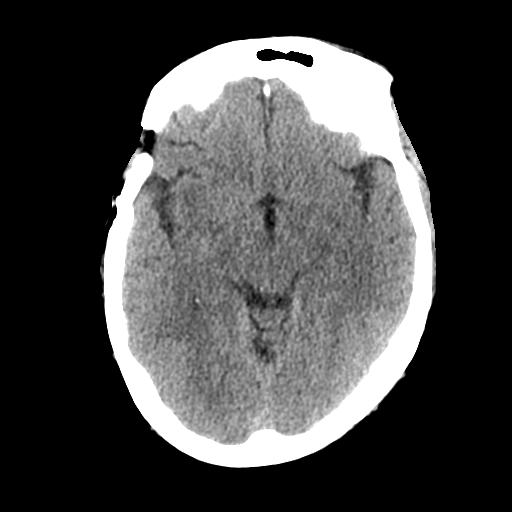
[im 16/30  brain]
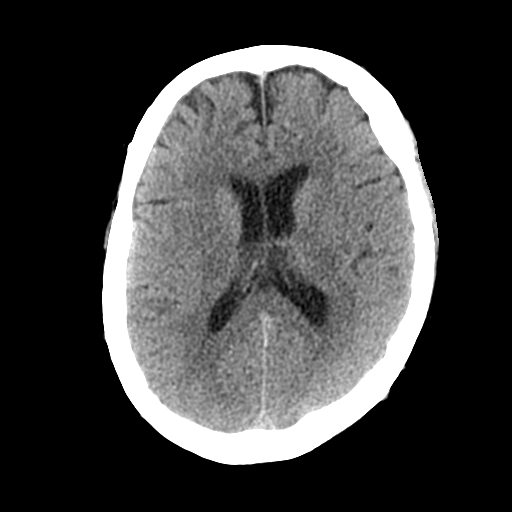
[im 16/30  bone]
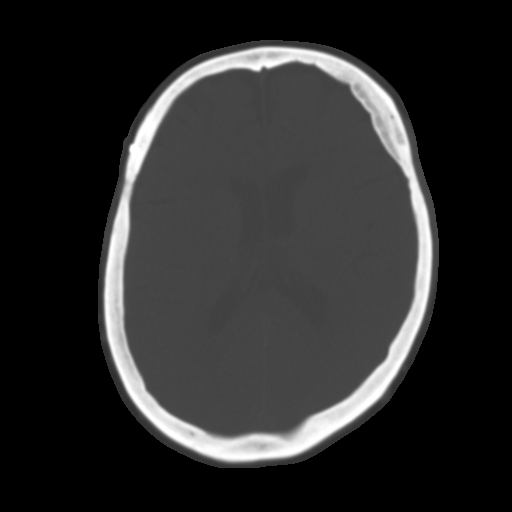
[im 19/30  brain]
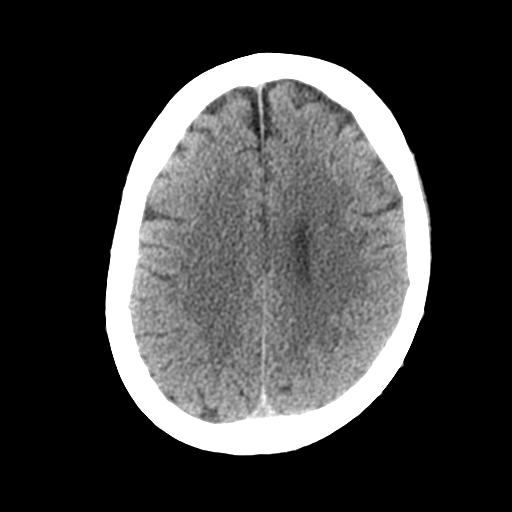
[im 22/30  brain]
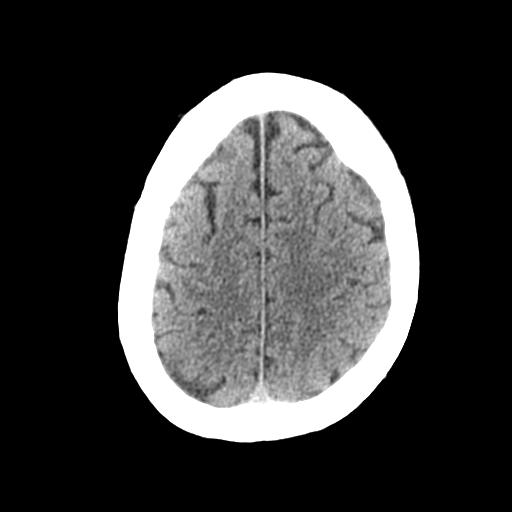
[im 25/30  brain]
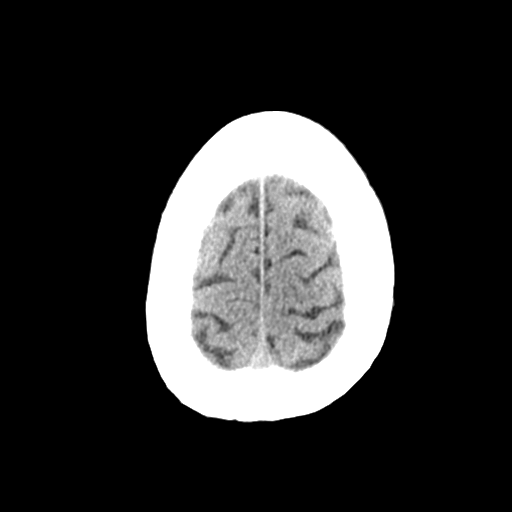
[im 28/30  brain]
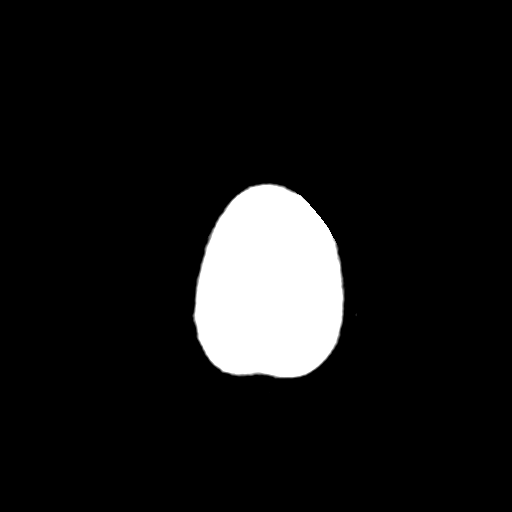
[im 28/30  bone]
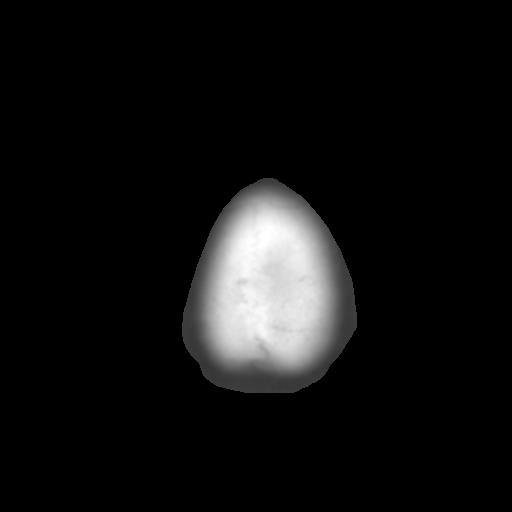

[Series 4: coronal soft tissue · coronal · 0.31mm/px · 3 of 57 slices shown]
[im 19/57  brain]
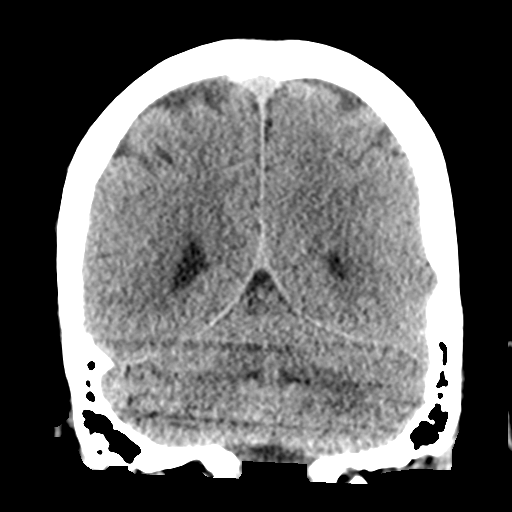
[im 25/57  brain]
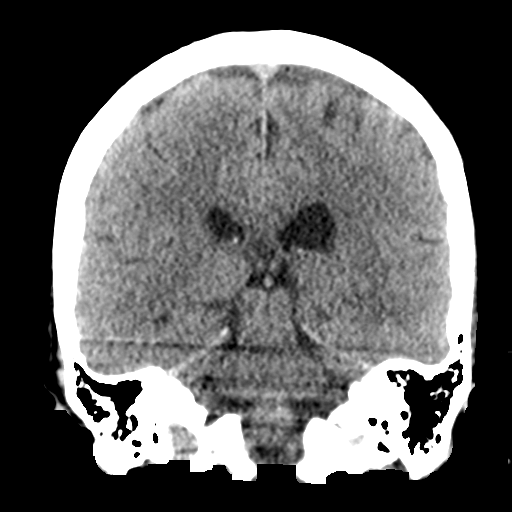
[im 32/57  brain]
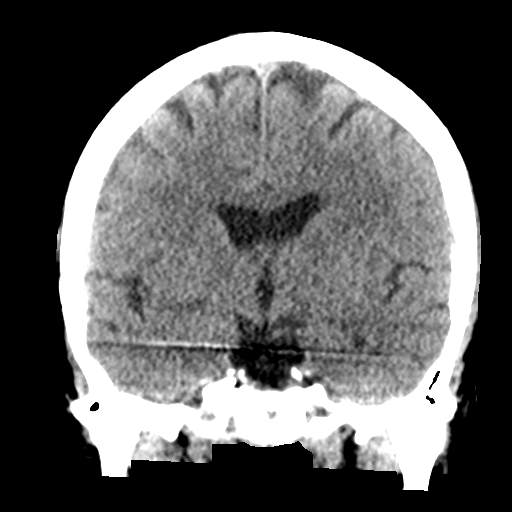

[Series 5: sagittal soft tissue · sagittal · 0.29mm/px · 3 of 46 slices shown]
[im 16/46  brain]
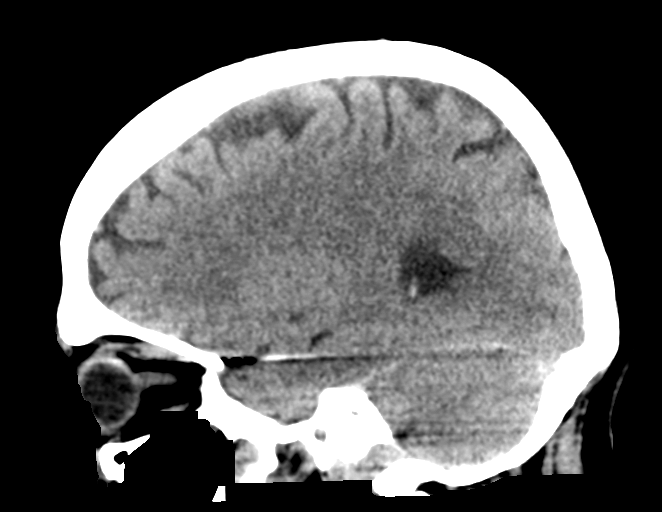
[im 23/46  brain]
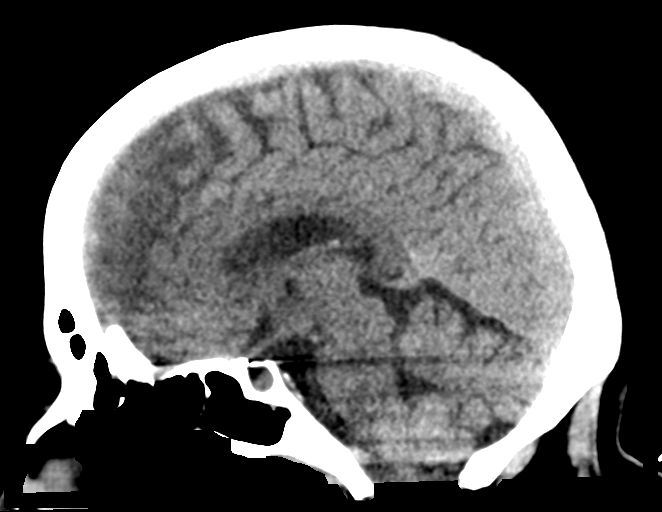
[im 31/46  brain]
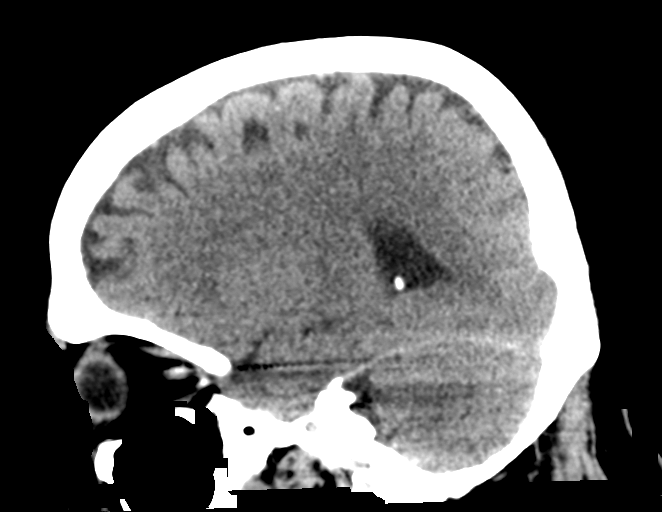

[15 of 47 positions shown; findings below may reference images not displayed]

FINDINGS: Brain: Mild age-related cerebral atrophy with chronic small vessel
ischemic disease. No acute intracranial hemorrhage. No acute large
vessel territory infarct. No mass lesion, midline shift or mass
effect. No hydrocephalus. No extra-axial fluid collection.

Vascular: Sequelae of prior surgical clipping of supraclinoid right
ICA aneurysm. No hyperdense vessel.

Skull: Prior right pterional craniotomy for surgical clipping of
aneurysm. Mild plagiocephaly. No acute skull fracture. Scalp soft
tissues demonstrate no acute abnormality.

Sinuses/Orbits: Globes and orbital soft tissues within normal
limits. No significant paranasal sinus disease. Mastoid air cells
are clear.

Other: None.
IMPRESSION: 1. No acute intracranial abnormality.
2. Sequelae of prior surgical clipping for right supraclinoid ICA
aneurysm.

## 2019-12-02 ENCOUNTER — Other Ambulatory Visit: Payer: Self-pay | Admitting: Family Medicine

## 2019-12-02 DIAGNOSIS — N6489 Other specified disorders of breast: Secondary | ICD-10-CM

## 2019-12-24 ENCOUNTER — Ambulatory Visit
Admission: RE | Admit: 2019-12-24 | Discharge: 2019-12-24 | Disposition: A | Discharge status: Home / Self Care | Payer: Medicare Other | Source: Ambulatory Visit | Attending: Family Medicine | Admitting: Family Medicine

## 2019-12-24 ENCOUNTER — Other Ambulatory Visit: Payer: Self-pay

## 2019-12-24 DIAGNOSIS — N6489 Other specified disorders of breast: Secondary | ICD-10-CM

## 2020-12-24 ENCOUNTER — Inpatient Hospital Stay
Admission: EM | Admit: 2020-12-24 | Discharge: 2020-12-27 | DRG: 641 | Disposition: A | Discharge status: Home / Self Care | Payer: Medicare Other | Attending: Internal Medicine | Admitting: Internal Medicine

## 2020-12-24 ENCOUNTER — Inpatient Hospital Stay: Payer: Medicare Other

## 2020-12-24 ENCOUNTER — Emergency Department: Payer: Medicare Other

## 2020-12-24 ENCOUNTER — Other Ambulatory Visit: Payer: Self-pay

## 2020-12-24 DIAGNOSIS — R404 Transient alteration of awareness: Secondary | ICD-10-CM | POA: Diagnosis not present

## 2020-12-24 DIAGNOSIS — G629 Polyneuropathy, unspecified: Secondary | ICD-10-CM | POA: Diagnosis present

## 2020-12-24 DIAGNOSIS — Z91040 Latex allergy status: Secondary | ICD-10-CM

## 2020-12-24 DIAGNOSIS — Z791 Long term (current) use of non-steroidal anti-inflammatories (NSAID): Secondary | ICD-10-CM

## 2020-12-24 DIAGNOSIS — Z86718 Personal history of other venous thrombosis and embolism: Secondary | ICD-10-CM

## 2020-12-24 DIAGNOSIS — E785 Hyperlipidemia, unspecified: Secondary | ICD-10-CM | POA: Diagnosis present

## 2020-12-24 DIAGNOSIS — K589 Irritable bowel syndrome without diarrhea: Secondary | ICD-10-CM | POA: Diagnosis present

## 2020-12-24 DIAGNOSIS — Z79890 Hormone replacement therapy (postmenopausal): Secondary | ICD-10-CM

## 2020-12-24 DIAGNOSIS — G40909 Epilepsy, unspecified, not intractable, without status epilepticus: Secondary | ICD-10-CM | POA: Diagnosis present

## 2020-12-24 DIAGNOSIS — Z888 Allergy status to other drugs, medicaments and biological substances status: Secondary | ICD-10-CM

## 2020-12-24 DIAGNOSIS — R413 Other amnesia: Secondary | ICD-10-CM | POA: Diagnosis present

## 2020-12-24 DIAGNOSIS — Z9181 History of falling: Secondary | ICD-10-CM

## 2020-12-24 DIAGNOSIS — E876 Hypokalemia: Secondary | ICD-10-CM | POA: Diagnosis present

## 2020-12-24 DIAGNOSIS — M25561 Pain in right knee: Secondary | ICD-10-CM | POA: Diagnosis present

## 2020-12-24 DIAGNOSIS — Z880 Allergy status to penicillin: Secondary | ICD-10-CM

## 2020-12-24 DIAGNOSIS — E039 Hypothyroidism, unspecified: Secondary | ICD-10-CM | POA: Diagnosis present

## 2020-12-24 DIAGNOSIS — R9431 Abnormal electrocardiogram [ECG] [EKG]: Secondary | ICD-10-CM | POA: Diagnosis present

## 2020-12-24 DIAGNOSIS — K219 Gastro-esophageal reflux disease without esophagitis: Secondary | ICD-10-CM | POA: Diagnosis present

## 2020-12-24 DIAGNOSIS — Z833 Family history of diabetes mellitus: Secondary | ICD-10-CM

## 2020-12-24 DIAGNOSIS — I671 Cerebral aneurysm, nonruptured: Secondary | ICD-10-CM

## 2020-12-24 DIAGNOSIS — Z6835 Body mass index (BMI) 35.0-35.9, adult: Secondary | ICD-10-CM

## 2020-12-24 DIAGNOSIS — W19XXXA Unspecified fall, initial encounter: Secondary | ICD-10-CM

## 2020-12-24 DIAGNOSIS — Z20822 Contact with and (suspected) exposure to covid-19: Secondary | ICD-10-CM | POA: Diagnosis present

## 2020-12-24 DIAGNOSIS — Z91030 Bee allergy status: Secondary | ICD-10-CM

## 2020-12-24 DIAGNOSIS — E669 Obesity, unspecified: Secondary | ICD-10-CM | POA: Diagnosis present

## 2020-12-24 DIAGNOSIS — F419 Anxiety disorder, unspecified: Secondary | ICD-10-CM | POA: Diagnosis present

## 2020-12-24 DIAGNOSIS — R569 Unspecified convulsions: Secondary | ICD-10-CM | POA: Diagnosis not present

## 2020-12-24 DIAGNOSIS — G510 Bell's palsy: Secondary | ICD-10-CM | POA: Diagnosis present

## 2020-12-24 DIAGNOSIS — F17210 Nicotine dependence, cigarettes, uncomplicated: Secondary | ICD-10-CM | POA: Diagnosis present

## 2020-12-24 DIAGNOSIS — Z800 Family history of malignant neoplasm of digestive organs: Secondary | ICD-10-CM

## 2020-12-24 DIAGNOSIS — Z79899 Other long term (current) drug therapy: Secondary | ICD-10-CM

## 2020-12-24 DIAGNOSIS — F250 Schizoaffective disorder, bipolar type: Secondary | ICD-10-CM

## 2020-12-24 DIAGNOSIS — I951 Orthostatic hypotension: Secondary | ICD-10-CM | POA: Diagnosis present

## 2020-12-24 DIAGNOSIS — Z882 Allergy status to sulfonamides status: Secondary | ICD-10-CM

## 2020-12-24 DIAGNOSIS — E860 Dehydration: Secondary | ICD-10-CM | POA: Diagnosis present

## 2020-12-24 DIAGNOSIS — I10 Essential (primary) hypertension: Secondary | ICD-10-CM | POA: Diagnosis present

## 2020-12-24 DIAGNOSIS — F603 Borderline personality disorder: Secondary | ICD-10-CM | POA: Diagnosis present

## 2020-12-24 DIAGNOSIS — Z8261 Family history of arthritis: Secondary | ICD-10-CM

## 2020-12-24 DIAGNOSIS — Z8051 Family history of malignant neoplasm of kidney: Secondary | ICD-10-CM

## 2020-12-24 DIAGNOSIS — D649 Anemia, unspecified: Secondary | ICD-10-CM | POA: Diagnosis present

## 2020-12-24 DIAGNOSIS — Z803 Family history of malignant neoplasm of breast: Secondary | ICD-10-CM

## 2020-12-24 LAB — BASIC METABOLIC PANEL
Anion gap: 4 — ABNORMAL LOW (ref 5–15)
Anion gap: 8 (ref 5–15)
BUN: 13 mg/dL (ref 6–20)
BUN: 16 mg/dL (ref 6–20)
CO2: 18 mmol/L — ABNORMAL LOW (ref 22–32)
CO2: 28 mmol/L (ref 22–32)
Calcium: 5.8 mg/dL — CL (ref 8.9–10.3)
Calcium: 9.2 mg/dL (ref 8.9–10.3)
Chloride: 118 mmol/L — ABNORMAL HIGH (ref 98–111)
Chloride: 102 mmol/L (ref 98–111)
Creatinine, Ser: 0.93 mg/dL (ref 0.44–1.00)
Creatinine, Ser: 1.2 mg/dL — ABNORMAL HIGH (ref 0.44–1.00)
GFR, Estimated: >60 mL/min (ref >60)
GFR, Estimated: 52 mL/min — ABNORMAL LOW (ref >60)
Glucose, Bld: 70 mg/dL (ref 70–99)
Glucose, Bld: 138 mg/dL — ABNORMAL HIGH (ref 70–99)
Potassium: 2.7 mmol/L — CL (ref 3.5–5.1)
Potassium: 3.6 mmol/L (ref 3.5–5.1)
Sodium: 140 mmol/L (ref 135–145)
Sodium: 138 mmol/L (ref 135–145)

## 2020-12-24 LAB — POC URINE PREG, ED: Preg Test, Ur: NEGATIVE

## 2020-12-24 LAB — RESP PANEL BY RT-PCR (FLU A&B, COVID) ARPGX2
Influenza A by PCR: NEGATIVE
Influenza B by PCR: NEGATIVE
SARS Coronavirus 2 by RT PCR: NEGATIVE

## 2020-12-24 LAB — TROPONIN I (HIGH SENSITIVITY)
Troponin I (High Sensitivity): 5 ng/L (ref <18)
Troponin I (High Sensitivity): 7 ng/L (ref <18)

## 2020-12-24 LAB — CBC
HCT: 31.7 % — ABNORMAL LOW (ref 36.0–46.0)
Hemoglobin: 10.2 g/dL — ABNORMAL LOW (ref 12.0–15.0)
MCH: 29.1 pg (ref 26.0–34.0)
MCHC: 32.2 g/dL (ref 30.0–36.0)
MCV: 90.3 fL (ref 80.0–100.0)
Platelets: 164 10*3/uL (ref 150–400)
RBC: 3.51 MIL/uL — ABNORMAL LOW (ref 3.87–5.11)
RDW: 13.4 % (ref 11.5–15.5)
WBC: 8.3 10*3/uL (ref 4.0–10.5)
nRBC: 0 % (ref 0.0–0.2)

## 2020-12-24 LAB — VALPROIC ACID LEVEL: Valproic Acid Lvl: <10 ug/mL — ABNORMAL LOW (ref 50.0–100.0)

## 2020-12-24 LAB — MAGNESIUM
Magnesium: 1.6 mg/dL — ABNORMAL LOW (ref 1.7–2.4)
Magnesium: 1.9 mg/dL (ref 1.7–2.4)

## 2020-12-24 LAB — PHOSPHORUS: Phosphorus: 3.1 mg/dL (ref 2.5–4.6)

## 2020-12-24 MED ORDER — CALCIUM GLUCONATE-NACL 2-0.675 GM/100ML-% IV SOLN
2.0000 g | Freq: Once | INTRAVENOUS | Status: AC
  Administered 2020-12-25: 2000 mg via INTRAVENOUS
  Filled 2020-12-24: qty 100

## 2020-12-24 MED ORDER — ATORVASTATIN CALCIUM 20 MG PO TABS
40.0000 mg | ORAL_TABLET | Freq: Every day | ORAL | Status: DC
  Administered 2020-12-25 – 2020-12-26 (×3): 40 mg via ORAL
  Filled 2020-12-24 (×3): qty 2

## 2020-12-24 MED ORDER — ACETAMINOPHEN 650 MG RE SUPP: 650.0000 mg | Freq: Four times a day (QID) | RECTAL | Status: DC | PRN

## 2020-12-24 MED ORDER — POTASSIUM CHLORIDE 10 MEQ/100ML IV SOLN
10.0000 meq | INTRAVENOUS | Status: AC
  Administered 2020-12-24 (×3): 10 meq via INTRAVENOUS
  Filled 2020-12-24 (×2): qty 100

## 2020-12-24 MED ORDER — ONDANSETRON HCL 4 MG PO TABS: 4.0000 mg | ORAL_TABLET | Freq: Four times a day (QID) | ORAL | Status: DC | PRN

## 2020-12-24 MED ORDER — POTASSIUM CHLORIDE 10 MEQ/100ML IV SOLN
10.0000 meq | Freq: Once | INTRAVENOUS | Status: AC
  Administered 2020-12-24: 10 meq via INTRAVENOUS
  Filled 2020-12-24: qty 100

## 2020-12-24 MED ORDER — HYDROCHLOROTHIAZIDE 25 MG PO TABS
25.0000 mg | ORAL_TABLET | Freq: Every day | ORAL | Status: DC
  Administered 2020-12-25: 25 mg via ORAL
  Filled 2020-12-24: qty 1

## 2020-12-24 MED ORDER — BENZTROPINE MESYLATE 1 MG PO TABS
2.0000 mg | ORAL_TABLET | Freq: Every day | ORAL | Status: DC
  Administered 2020-12-25 – 2020-12-27 (×3): 2 mg via ORAL
  Filled 2020-12-24 (×3): qty 2

## 2020-12-24 MED ORDER — MAGNESIUM SULFATE 2 GM/50ML IV SOLN
2.0000 g | Freq: Once | INTRAVENOUS | Status: AC
  Administered 2020-12-24: 2 g via INTRAVENOUS
  Filled 2020-12-24: qty 50

## 2020-12-24 MED ORDER — TRAZODONE HCL 50 MG PO TABS
25.0000 mg | ORAL_TABLET | Freq: Every evening | ORAL | Status: DC | PRN
  Filled 2020-12-24: qty 1

## 2020-12-24 MED ORDER — RISPERIDONE 1 MG PO TABS
0.5000 mg | ORAL_TABLET | Freq: Every day | ORAL | Status: DC
  Administered 2020-12-25 – 2020-12-27 (×4): 0.5 mg via ORAL
  Filled 2020-12-24 (×4): qty 0.5

## 2020-12-24 MED ORDER — DIAZEPAM 5 MG PO TABS
5.0000 mg | ORAL_TABLET | Freq: Every day | ORAL | Status: DC
  Administered 2020-12-25 – 2020-12-26 (×3): 5 mg via ORAL
  Filled 2020-12-24 (×3): qty 1

## 2020-12-24 MED ORDER — SODIUM CHLORIDE 0.9 % IV BOLUS
1000.0000 mL | Freq: Once | INTRAVENOUS | Status: AC
  Administered 2020-12-24: 1000 mL via INTRAVENOUS

## 2020-12-24 MED ORDER — POLYETHYLENE GLYCOL 3350 17 G PO PACK: 17.0000 g | PACK | Freq: Every day | ORAL | Status: DC | PRN

## 2020-12-24 MED ORDER — PANTOPRAZOLE SODIUM 40 MG PO TBEC
40.0000 mg | DELAYED_RELEASE_TABLET | Freq: Two times a day (BID) | ORAL | Status: DC
  Administered 2020-12-25 – 2020-12-27 (×5): 40 mg via ORAL
  Filled 2020-12-24 (×5): qty 1

## 2020-12-24 MED ORDER — SODIUM CHLORIDE 0.9% FLUSH
3.0000 mL | Freq: Two times a day (BID) | INTRAVENOUS | Status: DC
  Administered 2020-12-25 – 2020-12-27 (×5): 3 mL via INTRAVENOUS

## 2020-12-24 MED ORDER — VERAPAMIL HCL ER 300 MG PO CP24: 300.0000 mg | ORAL_CAPSULE | Freq: Every morning | ORAL | Status: DC

## 2020-12-24 MED ORDER — LABETALOL HCL 5 MG/ML IV SOLN: 10.0000 mg | Freq: Once | INTRAVENOUS | Status: DC

## 2020-12-24 MED ORDER — PHENYTOIN SODIUM EXTENDED 100 MG PO CAPS
400.0000 mg | ORAL_CAPSULE | Freq: Every day | ORAL | Status: DC
  Administered 2020-12-25 – 2020-12-27 (×3): 400 mg via ORAL
  Filled 2020-12-24 (×3): qty 4

## 2020-12-24 MED ORDER — VERAPAMIL HCL ER 180 MG PO TBCR
180.0000 mg | EXTENDED_RELEASE_TABLET | Freq: Every morning | ORAL | Status: DC
  Administered 2020-12-25 – 2020-12-26 (×2): 180 mg via ORAL
  Filled 2020-12-24 (×2): qty 1

## 2020-12-24 MED ORDER — LAMOTRIGINE 100 MG PO TABS
200.0000 mg | ORAL_TABLET | Freq: Every day | ORAL | Status: DC
  Administered 2020-12-25 – 2020-12-27 (×3): 200 mg via ORAL
  Filled 2020-12-24 (×3): qty 2

## 2020-12-24 MED ORDER — GABAPENTIN 300 MG PO CAPS
300.0000 mg | ORAL_CAPSULE | Freq: Three times a day (TID) | ORAL | Status: DC
  Administered 2020-12-25 – 2020-12-26 (×5): 300 mg via ORAL
  Filled 2020-12-24 (×5): qty 1

## 2020-12-24 MED ORDER — MELOXICAM 7.5 MG PO TABS
7.5000 mg | ORAL_TABLET | Freq: Two times a day (BID) | ORAL | Status: DC
  Administered 2020-12-25 – 2020-12-26 (×4): 7.5 mg via ORAL
  Filled 2020-12-24 (×5): qty 1

## 2020-12-24 MED ORDER — VALBENAZINE TOSYLATE 40 MG PO CAPS
80.0000 mg | ORAL_CAPSULE | Freq: Every day | ORAL | Status: DC
  Administered 2020-12-25 – 2020-12-26 (×3): 80 mg via ORAL
  Filled 2020-12-24 (×4): qty 2

## 2020-12-24 MED ORDER — LURASIDONE HCL 40 MG PO TABS
120.0000 mg | ORAL_TABLET | Freq: Every day | ORAL | Status: DC
  Administered 2020-12-25 (×2): 120 mg via ORAL
  Filled 2020-12-24 (×3): qty 3

## 2020-12-24 MED ORDER — VERAPAMIL HCL ER 120 MG PO TBCR
120.0000 mg | EXTENDED_RELEASE_TABLET | Freq: Every morning | ORAL | Status: DC
  Administered 2020-12-25 – 2020-12-27 (×3): 120 mg via ORAL
  Filled 2020-12-24 (×3): qty 1

## 2020-12-24 MED ORDER — LOSARTAN POTASSIUM 50 MG PO TABS
100.0000 mg | ORAL_TABLET | Freq: Every day | ORAL | Status: DC
  Administered 2020-12-25 – 2020-12-27 (×3): 100 mg via ORAL
  Filled 2020-12-24 (×3): qty 2

## 2020-12-24 MED ORDER — LINACLOTIDE 72 MCG PO CAPS
72.0000 ug | ORAL_CAPSULE | Freq: Every day | ORAL | Status: DC
  Administered 2020-12-25 – 2020-12-26 (×2): 72 ug via ORAL
  Filled 2020-12-24 (×4): qty 1

## 2020-12-24 MED ORDER — ONDANSETRON HCL 4 MG/2ML IJ SOLN: 4.0000 mg | Freq: Four times a day (QID) | INTRAMUSCULAR | Status: DC | PRN

## 2020-12-24 MED ORDER — LOSARTAN POTASSIUM-HCTZ 100-25 MG PO TABS: 1.0000 | ORAL_TABLET | Freq: Every day | ORAL | Status: DC

## 2020-12-24 MED ORDER — VENLAFAXINE HCL ER 150 MG PO CP24
150.0000 mg | ORAL_CAPSULE | Freq: Every day | ORAL | Status: DC
  Administered 2020-12-25 (×2): 150 mg via ORAL
  Filled 2020-12-24 (×3): qty 1
  Filled 2020-12-24 (×2): qty 2
  Filled 2020-12-24: qty 1

## 2020-12-24 MED ORDER — LEVOTHYROXINE SODIUM 50 MCG PO TABS
75.0000 ug | ORAL_TABLET | Freq: Every day | ORAL | Status: DC
  Administered 2020-12-25 – 2020-12-27 (×3): 75 ug via ORAL
  Filled 2020-12-24 (×3): qty 2

## 2020-12-24 MED ORDER — HYDRALAZINE HCL 50 MG PO TABS
25.0000 mg | ORAL_TABLET | Freq: Two times a day (BID) | ORAL | Status: DC
  Administered 2020-12-24 – 2020-12-26 (×4): 25 mg via ORAL
  Filled 2020-12-24 (×5): qty 1

## 2020-12-24 MED ORDER — CALCIUM GLUCONATE-NACL 1-0.675 GM/50ML-% IV SOLN
1.0000 g | Freq: Once | INTRAVENOUS | Status: AC
  Administered 2020-12-24: 1000 mg via INTRAVENOUS
  Filled 2020-12-24: qty 50

## 2020-12-24 MED ORDER — VERAPAMIL HCL ER 120 MG PO TBCR
120.0000 mg | EXTENDED_RELEASE_TABLET | Freq: Every day | ORAL | Status: DC
  Administered 2020-12-25: 120 mg via ORAL
  Filled 2020-12-24 (×3): qty 1

## 2020-12-24 MED ORDER — LABETALOL HCL 5 MG/ML IV SOLN
20.0000 mg | Freq: Once | INTRAVENOUS | Status: AC
  Administered 2020-12-24: 20 mg via INTRAVENOUS
  Filled 2020-12-24: qty 4

## 2020-12-24 MED ORDER — BUSPIRONE HCL 5 MG PO TABS
10.0000 mg | ORAL_TABLET | Freq: Three times a day (TID) | ORAL | Status: DC
  Administered 2020-12-25 – 2020-12-27 (×8): 10 mg via ORAL
  Filled 2020-12-24 (×10): qty 2

## 2020-12-24 MED ORDER — ACETAMINOPHEN 325 MG PO TABS: 650.0000 mg | ORAL_TABLET | Freq: Four times a day (QID) | ORAL | Status: DC | PRN

## 2020-12-24 NOTE — H&P (Addendum)
[facility] History and Physical  Robin Jensen [id number] DOB: [birth year] DOA: 12/24/2020  Referring physician: Dr. Kerman Passey PCP: Donnie Coffin, MD   Chief Complaint: falls  HPI: Robin Jensen is a 60 y.o. female with hx of gets affective disorder bipolar type, hypothyroidism thought to be secondary to lithium therapy, most cerebral aneurysm status post clips, seizure disorder, memory issues, hypertension, who presents with falls and an episode of syncope.  Patient reports that earlier today she was sitting with family members and she "passed out".  Initially told me she was passed out for 45 minutes, then said that she told her daughter not to call an ambulance, when I try to clarify if it is 45 minutes or 45 seconds she told me she could not be sure.  She reports she has also been falling recently, she feels like her right leg gives out and this is happened twice recently.  She has a history of seizures admits that she has not missed any of her recent medications.  She reports she was told she has problems with her thyroid due to taking lithium in the past.  Called family to clarify earlier episode, spoke with daughter in law Jinny Blossom [phone]). She reports that the patient stood up to stand, then sat right back down and was "out". She then started to raise her hand to mouth and then back down. They tried to shake her and wake her up but were unable to for 45 minutes. Reports she has had similar episodes in the past but would come to faster, usually only 5-10 minutes. She seemed confused when she woke up. Has happened a couple times in the last couple days. No generalized shaking and does not seem like her prior seizures, family reports no recent seizures that they are aware of. Because of length of episode she decided to call 911.   In the ED initial vital signs unremarkable although she later developed significant hypertension that required IV medications.  Lab work-up  was notable for BMP with multiple abnormalities.  Calcium was markedly low at 5.8, magnesium was 1.6, and potassium was 2.7, with remainder of BMP normal.  CBC showed mild anemia with hemoglobin 10.2 which is new but otherwise was normal.  Serial troponins were normal.  Chest x-ray was normal.  EKG showed mild QT prolongation to 474, as well as diffuse T wave flattening, however overall it was unchanged from prior with the exception of QT prolongation.  She was given calcium, magnesium, and potassium supplementation, as well as a 1 L NS bolus and admitted for further management.  Review of Systems:  Pertinent positives and negative per HPI, all others reviewed and negative  Past Medical History:  Diagnosis Date   Aneurysm ([place])    head aneurysm    Anxiety    Bipolar 1 disorder (HCC)    Cancer ([place])    uterine   Depression    Dry mouth    DVT of leg (deep venous thrombosis) (HCC)    history of   Easy bruising    Eye dryness    Fatigue    GERD (gastroesophageal reflux disease)    Hyperlipidemia    Hypertension    Hypothyroid    Migraine    Migraine    Seizure disorder ([place])    Seizures ([place])    last year last seizure-had brain aneurysm. ON meds   Stroke [facility]) 1997   No deficits   Suicidal ideation    attempted overdoses  in past   Past Surgical History:  Procedure Laterality Date   ABDOMINAL HYSTERECTOMY     CEREBRAL ANEURYSM REPAIR  1997   CESAREAN SECTION     CHOLECYSTECTOMY     COLONOSCOPY WITH PROPOFOL N/A 08/20/2012   Procedure: COLONOSCOPY WITH PROPOFOL;  Surgeon: [name], MD;  Location: [facility];  Service: Endoscopy;  Laterality: N/A;  In cecum at [place]; Total Withdrawal Time= 12 minutes   COLONOSCOPY WITH PROPOFOL N/A 09/28/2017   Procedure: COLONOSCOPY WITH PROPOFOL;  Surgeon: [name], MD;  Location[facility];  Service: Endoscopy;  Laterality: N/A;   ESOPHAGOGASTRODUODENOSCOPY (EGD) WITH PROPOFOL N/A 11/20/2016   Procedure:  ESOPHAGOGASTRODUODENOSCOPY (EGD) WITH PROPOFOL;  Surgeon: [name], MD;  Location: [facility] ENDOSCOPY;  Service: Endoscopy;  Laterality: N/A;   ESOPHAGOGASTRODUODENOSCOPY (EGD) WITH PROPOFOL N/A 09/28/2017   Procedure: ESOPHAGOGASTRODUODENOSCOPY (EGD) WITH PROPOFOL;  Surgeon: [name], MD;  Location: [facility] ENDOSCOPY;  Service: Endoscopy;  Laterality: N/A;   RADICAL HYSTERECTOMY     Social History:  reports that she has been smoking. She has never used smokeless tobacco. She reports that she does not drink alcohol and does not use drugs.  Allergies  Allergen Reactions   Bee Venom Anaphylaxis   Penicillins Shortness Of Breath and Rash   Latex    Lisinopril    Other    Sulfa Antibiotics     Family History  Problem Relation Age of Onset   Colon cancer Father        less than age 75   Colon cancer Paternal Uncle        78s   Colon cancer Paternal Grandfather    Breast cancer Maternal Aunt    Diabetes Sister    Arthritis Brother    Hematuria Mother    Kidney cancer Maternal Grandmother    Kidney disease Maternal Grandmother    Prostate cancer Neg Hx      Prior to Admission medications   Medication Sig Start Date End Date Taking? Authorizing Provider  atorvastatin (LIPITOR) 40 MG tablet Take 40 mg by mouth at bedtime.     [provider]  Brexpiprazole 2 MG TABS Take 2 mg by mouth.    [provider]  cyclobenzaprine (FLEXERIL) 10 MG tablet Take 10 mg by mouth every 8 (eight) hours as needed for muscle spasms.     [provider]  diazepam (VALIUM) 5 MG tablet Take 5 mg by mouth at bedtime. 09/20/17   [provider]  donepezil (ARICEPT) 10 MG tablet Take 10 mg by mouth at bedtime.    [provider]  EPINEPHrine 0.3 mg/0.3 mL IJ SOAJ injection Inject 0.3 mg into the muscle as needed. For anaphylactic reaction    [provider]  estradiol (ESTRACE VAGINAL) 0.1 MG/GM vaginal cream Apply 0.'5mg'$  (pea-sized amount)  just  inside the vaginal introitus with a finger-tip on Monday, Wednesday and Friday nights. 01/31/19   Zara Council A, PA-C  gabapentin (NEURONTIN) 300 MG capsule Take 300 mg by mouth 3 (three) times daily.    [provider]  labetalol (NORMODYNE) 200 MG tablet Take 200 mg by mouth 2 (two) times daily.    [provider]  labetalol (NORMODYNE) 300 MG tablet Take 1 tablet (300 mg total) by mouth 2 (two) times daily. 10/06/17   Salary, Avel Peace, MD  lamoTRIgine (LAMICTAL) 200 MG tablet Take 300 mg by mouth daily.     [provider]  levothyroxine (SYNTHROID, [place]) 75 MCG  tablet Take 1 tablet (75 mcg total) by mouth daily before breakfast. 10/03/12   Lysbeth Penner, FNP  linaclotide Rolan Lipa) 72 MCG capsule Take by mouth. 04/15/18   [provider]  losartan-hydrochlorothiazide (HYZAAR) 100-25 MG per tablet Take 1 tablet by mouth daily. 10/03/12   Lysbeth Penner, FNP  Lurasidone HCl 120 MG TABS Take by mouth at bedtime.    [provider]  nitrofurantoin, macrocrystal-monohydrate, (MACROBID) 100 MG capsule Take 1 capsule (100 mg total) by mouth 2 (two) times daily. 02/03/19   Zara Council A, PA-C  nortriptyline (PAMELOR) 25 MG capsule Take 25 mg by mouth at bedtime. 03/18/18 06/16/18  [provider]  pantoprazole (PROTONIX) 40 MG tablet Take 1 tablet (40 mg total) by mouth 2 (two) times daily. 10/06/17   Salary, Avel Peace, MD  phenytoin (DILANTIN) 200 MG ER capsule Take 400 mg by mouth daily.     [provider]  potassium chloride (K-DUR) 10 MEQ tablet Take 10 mEq by mouth daily. 09/22/17   [provider]  tiZANidine (ZANAFLEX) 2 MG tablet Take 2 mg by mouth 2 (two) times daily. 09/26/17   [provider]  Verapamil HCl CR 300 MG CP24 Take 1 capsule (300 mg total) by mouth daily. Patient taking differently: Take 300 mg by mouth every evening.  10/03/12   Lysbeth Penner, FNP   Physical Exam: Vitals:    12/24/20 2048 12/24/20 2050 12/24/20 2110 12/24/20 2121  BP:  (!) 213/120 (!) 208/110 (!) 195/103  Pulse: 75  82 81  Resp: 20     Temp:      TempSrc:      SpO2: 98%  97% 96%  Weight:      Height:        Wt Readings from Last 3 Encounters:  12/24/20 81.2 kg  01/31/19 81.2 kg  06/26/18 72.6 kg     General:  Appears calm and comfortable Eyes: PERRL, normal lids, irises & conjunctiva ENT: grossly normal hearing, lips & tongue Cardiovascular: RRR, no m/r/g. No LE edema. Respiratory: CTA bilaterally, no w/r/r. Normal respiratory effort. Abdomen: soft, ntnd Skin: no rash or induration seen on limited exam Musculoskeletal: grossly normal tone BUE/BLE Psychiatric: grossly normal mood and affect, speech fluent and appropriate.  Neurologic: grossly non-focal.CN intact. Strength 5/5 in UE/LE. Negative pronator drift.          Labs on Admission:  Basic Metabolic Panel: Recent Labs  Lab 12/24/20 1357  NA 140  K 2.7*  CL 118*  CO2 18*  GLUCOSE 70  BUN 13  CREATININE 0.93  CALCIUM 5.8*  MG 1.6*   Liver Function Tests: No results for input(s): AST, ALT, ALKPHOS, BILITOT, PROT, ALBUMIN in the last 168 hours. No results for input(s): LIPASE, AMYLASE in the last 168 hours. No results for input(s): AMMONIA in the last 168 hours. CBC: Recent Labs  Lab 12/24/20 1357  WBC 8.3  HGB 10.2*  HCT 31.7*  MCV 90.3  PLT 164   Cardiac Enzymes: No results for input(s): CKTOTAL, CKMB, CKMBINDEX, TROPONINI in the last 168 hours.  BNP (last 3 results) No results for input(s): BNP in the last 8760 hours.  ProBNP (last 3 results) No results for input(s): PROBNP in the last 8760 hours.  CBG: No results for input(s): GLUCAP in the last 168 hours.  Radiological Exams on Admission: DG Chest 2 View  Result Date: 12/24/2020 CLINICAL DATA:  Syncope. EXAM: CHEST - 2 VIEW COMPARISON:  None. FINDINGS: The heart  size and mediastinal contours are within normal limits. Both lungs are clear. The  visualized skeletal structures are unremarkable. IMPRESSION: No active cardiopulmonary disease. Electronically Signed   By: Marijo Conception M.D.   On: 12/24/2020 14:48    EKG: Independently reviewed. Sinus rhythm, QT prolongation, diffuse precordial T wave flattening and inversion.  Overall no acute ischemic changes and no significant change compared to prior.  Assessment/Plan Principal Problem:   Hypocalcemia   ANYCIA KALISZEWSKI is a 60 y.o. female with hx of gets affective disorder bipolar type, hypothyroidism thought to be secondary to lithium therapy, most cerebral aneurysm status post clips, seizure disorder, memory issues, hypertension, who presents with falls and an episode of syncope as well as new hypocalcemia, hypokalemia, and hypomagnesemia.  #Syncope #Seizure disorder Unusual episode earlier today on history from both patient and her family.  Unclear etiology, may possibly represent seizure activity.  Does have remote history of aneurysm, given increasing frequency will obtain head imaging to rule out acute causes.  Neuro exam nonfocal, low suspicion for ischemic event.  Patient also on a dizzy amount have different psychoactive medications which may have contributed. Plan to consult Neurology in AM, order placed but will need to be called in AM. - Check Lamictal, phenytoin, and valproic acid levels - Do not appear to have lurasidone level available as an order in epic - CT head without contrast ordered and pending - Continue Lamictal, phenytoin, Depakote, lurasidone - Continue benztropine, Ingrezza  - Discuss case with neurology in a.m.  #Hypocalcemia Marked hypocalcemia to 5.8 on initial check, repeat pending.  Hypomagnesemia likely also contributing.  Etiology is unclear, lab work-up in progress. - Trend BMP every 6 - Check PTH, ionized calcium, calcitriol, vitamin D  #Falls Per patient report is mechanical, will need to evaluate further. - PT and OT  consultation   #Hypomagnesemia #Hypokalemia Unclear relationship to hypocalcemia. - Trend BMP and replete as needed  #Chronic medical problems Hyperlipidemia-continue atorvastatin Anxiety-depression-continue BuSpar, desvenlafaxine, Valium, risperidone Neuropathy-continue gabapentin, meloxicam Hypertension-continue hydralazine, losartan, hydrochlorothiazide, verapamil Hypothyroidism-continue Synthroid IBS-continue linaclotide GERD-continue PPI  Code Status: Full Code DVT Prophylaxis: SCDs Family Communication: Daughter in Associate Professor updated by phone Disposition Plan: Inpatient, Med-Surg   Time spent: 60 min  Clarnce Flock MD/[facility]  Note:  This document was prepared using Systems analyst and may include unintentional dictation errors.

## 2020-12-24 NOTE — ED Provider Notes (Signed)
[facility] Emergency Department Provider Note  Time seen: 4:42 PM  I have reviewed the triage vital signs and the nursing notes.   HISTORY  Chief Complaint Hypotension   HPI Robin Jensen is a 60 y.o. female with a past medical history of anxiety, bipolar, gastric reflux, hypertension, hyperlipidemia, seizure disorder, presents to the emergency department for generalized fatigue weakness a fall and a loss of consciousness.  According to the patient for the past several days she has been feeling quite weak which she describes as generalized fatigue.  States she was so weak she had a fall yesterday although denies hitting her head.  States her legs gave out from under her.  Patient states today she had feeling weak and had a brief syncopal episode.  Denies any chest pain today but states yesterday she was experiencing chest pain.  Denies any shortness of breath.  States intermittent nausea, denies any fever.  EMS states upon their arrival patient had a blood pressure of 84/60.   Past Medical History:  Diagnosis Date   Aneurysm ([place])    head aneurysm    Anxiety    Bipolar 1 disorder (HCC)    Cancer ([place])    uterine   Depression    Dry mouth    DVT of leg (deep venous thrombosis) (HCC)    history of   Easy bruising    Eye dryness    Fatigue    GERD (gastroesophageal reflux disease)    Hyperlipidemia    Hypertension    Hypothyroid    Migraine    Migraine    Seizure disorder ([place])    Seizures ([place])    last year last seizure-had brain aneurysm. 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   Agoraphobia  01/28/2016   Borderline personality disorder ([place]) 01/28/2016   Falls 01/28/2016   Panic disorder 01/28/2016   Schizoaffective disorder, bipolar type ([place]) 01/28/2016   Spells of decreased attentiveness 01/28/2016   Cervical cancer ([place]) 05/06/2013   Aneurysm, cerebral, nonruptured 08/14/2012   Unspecified hypothyroidism 08/14/2012   Other and unspecified hyperlipidemia 08/14/2012   Seizures ([place]) 08/14/2012   Insomnia 08/14/2012   Unspecified constipation 08/07/2012   FHx: colon cancer 08/07/2012   Right sided abdominal pain 08/07/2012   Essential hypertension, benign 08/07/2012   Abnormal LFTs 08/07/2012    Past Surgical History:  Procedure Laterality Date   ABDOMINAL HYSTERECTOMY     CEREBRAL ANEURYSM REPAIR  1997   CESAREAN SECTION     CHOLECYSTECTOMY     COLONOSCOPY WITH PROPOFOL N/A 08/20/2012   Procedure: COLONOSCOPY WITH PROPOFOL;  Surgeon: [name], MD;  Location: [facility];  Service: Endoscopy;  Laterality: N/A;  In cecum at [place]; Total Withdrawal Time= 12 minutes   COLONOSCOPY WITH PROPOFOL N/A 09/28/2017   Procedure: COLONOSCOPY WITH PROPOFOL;  Surgeon: [name], MD;  Location: [facility] ENDOSCOPY;  Service: Endoscopy;  Laterality: N/A;   ESOPHAGOGASTRODUODENOSCOPY (EGD) WITH PROPOFOL N/A 11/20/2016   Procedure: ESOPHAGOGASTRODUODENOSCOPY (EGD) WITH PROPOFOL;  Surgeon: [name], MD;  Location: [facility] ENDOSCOPY;  Service: Endoscopy;  Laterality: N/A;  ESOPHAGOGASTRODUODENOSCOPY (EGD) WITH PROPOFOL N/A 09/28/2017   Procedure: ESOPHAGOGASTRODUODENOSCOPY (EGD) WITH PROPOFOL;  Surgeon: [name], MD;  Location: [facility] ENDOSCOPY;  Service: Endoscopy;  Laterality: N/A;   RADICAL HYSTERECTOMY      Prior to Admission medications   Medication Sig Start Date End Date Taking? Authorizing Provider  atorvastatin (LIPITOR) 40 MG tablet Take 40 mg by mouth at bedtime.     [provider]  Brexpiprazole 2 MG TABS Take 2 mg by mouth.    [provider]  cyclobenzaprine (FLEXERIL) 10 MG tablet Take 10 mg by mouth every 8 (eight) hours as needed for muscle spasms.     [provider]  diazepam (VALIUM) 5 MG tablet Take 5 mg by mouth at bedtime. 09/20/17   [provider]  donepezil (ARICEPT) 10 MG tablet Take 10 mg by mouth at bedtime.    [provider]  EPINEPHrine 0.3 mg/0.3 mL IJ SOAJ injection Inject 0.3 mg into the muscle as needed. For anaphylactic reaction    [provider]  estradiol (ESTRACE VAGINAL) 0.1 MG/GM vaginal cream Apply 0.'5mg'$  (pea-sized amount)  just inside the vaginal introitus with a finger-tip on Monday, Wednesday and Friday nights. 01/31/19   Zara Council A, PA-C  gabapentin (NEURONTIN) 300 MG capsule Take 300 mg by mouth 3 (three) times daily.    [provider]  labetalol (NORMODYNE) 200 MG tablet Take 200 mg by mouth 2 (two) times daily.    [provider]  labetalol (NORMODYNE) 300 MG tablet Take 1 tablet (300 mg total) by mouth 2 (two) times daily. 10/06/17   Salary, Avel Peace, MD  lamoTRIgine (LAMICTAL) 200 MG tablet Take 300 mg by mouth daily.     [provider]  levothyroxine (SYNTHROID, LEVOTHROID) 75 MCG tablet Take 1 tablet (75 mcg total) by mouth daily before breakfast. 10/03/12   Lysbeth Penner, FNP  linaclotide Rolan Lipa) 72 MCG capsule Take by mouth. 04/15/18   [provider]  losartan-hydrochlorothiazide (HYZAAR) 100-25 MG per tablet Take 1 tablet by mouth daily. 10/03/12   Lysbeth Penner, FNP  Lurasidone HCl 120 MG TABS Take by mouth at bedtime.    [provider]  nitrofurantoin, macrocrystal-monohydrate, (MACROBID) 100 MG capsule Take 1 capsule (100 mg total) by mouth 2 (two) times daily. 02/03/19   Zara Council A, PA-C  nortriptyline (PAMELOR) 25 MG capsule Take 25 mg by mouth at bedtime. 03/18/18 06/16/18  [provider]  pantoprazole (PROTONIX) 40 MG tablet Take 1 tablet (40 mg total) by  mouth 2 (two) times daily. 10/06/17   Salary, Avel Peace, MD  phenytoin (DILANTIN) 200 MG ER capsule Take 400 mg by mouth daily.     [provider]  potassium chloride (K-DUR) 10 MEQ tablet Take 10 mEq by mouth daily. 09/22/17   [provider]  tiZANidine (ZANAFLEX) 2 MG tablet Take 2 mg by mouth 2 (two) times daily. 09/26/17   [provider]  Verapamil HCl CR 300 MG CP24 Take 1 capsule (300 mg total) by mouth daily. Patient taking differently: Take 300 mg by mouth every evening.  10/03/12   Lysbeth Penner, FNP    Allergies  Allergen Reactions   Bee Venom Anaphylaxis   Penicillins Shortness Of Breath and Rash   Latex    Lisinopril    Other    Sulfa Antibiotics     Family History  Problem Relation Age of Onset   Colon cancer Father  less than age 26   Colon cancer Paternal Uncle        101s   Colon cancer Paternal Grandfather    Breast cancer Maternal Aunt    Diabetes Sister    Arthritis Brother    Hematuria Mother    Kidney cancer Maternal Grandmother    Kidney disease Maternal Grandmother    Prostate cancer Neg Hx     Social History Social History   Tobacco Use   Smoking status: Light Smoker    Types: Cigarettes    Last attempt to quit: 08/15/2005    Years since quitting: 15.3   Smokeless tobacco: Never  Substance Use Topics   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  Positive for generalized fatigue/weakness. Cardiovascular: Negative for chest pain. Respiratory: Negative for shortness of breath.  Negative for cough. Gastrointestinal: Negative for abdominal pain, vomiting and diarrhea.  Positive for nausea.   Genitourinary: Negative for urinary compaints Musculoskeletal: Negative for musculoskeletal complaints Neurological: Negative for headache All other ROS  negative  ____________________________________________   PHYSICAL EXAM:  VITAL SIGNS: ED Triage Vitals  Enc Vitals Group     BP 12/24/20 1353 93/77     Pulse Rate 12/24/20 1353 67     Resp 12/24/20 1353 18     Temp 12/24/20 1353 98.7 F (37.1 C)     Temp Source 12/24/20 1353 Oral     SpO2 12/24/20 1353 95 %     Weight 12/24/20 1354 179 lb (81.2 kg)     Height 12/24/20 1354 '5\' 2"'$  (1.575 m)     Head Circumference --      Peak Flow --      Pain Score 12/24/20 1354 0     Pain Loc --      Pain Edu? --      Excl. in [place]? --    Constitutional: Alert and oriented. Well appearing and in no distress. Eyes: Normal exam ENT      Head: Normocephalic and atraumatic.      Mouth/Throat: Mucous membranes are moist. Cardiovascular: Normal rate, regular rhythm. Respiratory: Normal respiratory effort without tachypnea nor retractions. Breath sounds are clear  Gastrointestinal: Soft and nontender. No distention. Musculoskeletal: Nontender with normal range of motion in all extremities. Neurologic:  Normal speech and language. No gross focal neurologic deficits  Skin:  Skin is warm, dry and intact.  Psychiatric: Mood and affect are normal.   ____________________________________________    EKG  EKG viewed and interpreted by myself.  Shows sinus rhythm at 64 bpm with a narrow QRS, normal axis, normal intervals, nonspecific ST changes.  No ST elevation.  ____________________________________________    RADIOLOGY  X-ray negative  ____________________________________________   INITIAL IMPRESSION / ASSESSMENT AND PLAN / ED COURSE  Pertinent labs & imaging results that were available during my care of the patient were reviewed by me and considered in my medical decision making (see chart for details).   Patient presents emergency department for generalized fatigue weakness and hypotension.  Patient found to have a very low calcium on her work-up of 5.8 as well as a potassium of 2.7 and  now a magnesium of 1.6.  Reassuringly troponin is negative.  No history of hypocalcemia previously per record review.  We will begin IV repletion with calcium gluconate, IV potassium, oral potassium.  Given the  patient is generalized weakness with hypocalcemia and a fall yesterday we will admit to the hospital service for further work-up and treatment.  Patient agreeable to plan of care.  Robin Jensen was evaluated in Emergency Department on 12/24/2020 for the symptoms described in the history of present illness. She was evaluated in the context of the global COVID-19 pandemic, which necessitated consideration that the patient might be at risk for infection with the SARS-CoV-2 virus that causes COVID-19. Institutional protocols and algorithms that pertain to the evaluation of patients at risk for COVID-19 are in a state of rapid change based on information released by regulatory bodies including the CDC and federal and state organizations. These policies and algorithms were followed during the patient's care in the ED.  ____________________________________________   FINAL CLINICAL IMPRESSION(S) / ED DIAGNOSES  Generalized weakness Hypocalcemia   Harvest Dark, MD 12/24/20 [phone]

## 2020-12-24 NOTE — ED Notes (Signed)
Informed RN bed assigned 

## 2020-12-24 NOTE — ED Triage Notes (Addendum)
Pt arrived via ems from home. Ems reports syncopal episode x 68mn. Ems reported initial bp of 84/60. Ems gave 3093mNS, BP increased to 118/64. Pt a&o x 4. Denies any pain. EMS reported that pt went outside to smoke and husband thought she had fallen asleep until someone tried to wake her up and was initially unable to wake pt. NAD noted at this time

## 2020-12-25 ENCOUNTER — Encounter: Payer: Self-pay | Admitting: Family Medicine

## 2020-12-25 ENCOUNTER — Inpatient Hospital Stay: Payer: Medicare Other

## 2020-12-25 LAB — BASIC METABOLIC PANEL
Anion gap: 8 (ref 5–15)
Anion gap: 6 (ref 5–15)
Anion gap: 8 (ref 5–15)
BUN: 13 mg/dL (ref 6–20)
BUN: 13 mg/dL (ref 6–20)
BUN: 12 mg/dL (ref 6–20)
CO2: 28 mmol/L (ref 22–32)
CO2: 26 mmol/L (ref 22–32)
CO2: 29 mmol/L (ref 22–32)
Calcium: 9.5 mg/dL (ref 8.9–10.3)
Calcium: 9.2 mg/dL (ref 8.9–10.3)
Calcium: 8.8 mg/dL — ABNORMAL LOW (ref 8.9–10.3)
Chloride: 103 mmol/L (ref 98–111)
Chloride: 106 mmol/L (ref 98–111)
Chloride: 98 mmol/L (ref 98–111)
Creatinine, Ser: 1.03 mg/dL — ABNORMAL HIGH (ref 0.44–1.00)
Creatinine, Ser: 0.99 mg/dL (ref 0.44–1.00)
Creatinine, Ser: 0.99 mg/dL (ref 0.44–1.00)
GFR, Estimated: >60 mL/min (ref >60)
GFR, Estimated: >60 mL/min (ref >60)
GFR, Estimated: >60 mL/min (ref >60)
Glucose, Bld: 87 mg/dL (ref 70–99)
Glucose, Bld: 146 mg/dL — ABNORMAL HIGH (ref 70–99)
Glucose, Bld: 83 mg/dL (ref 70–99)
Potassium: 3.1 mmol/L — ABNORMAL LOW (ref 3.5–5.1)
Potassium: 3.7 mmol/L (ref 3.5–5.1)
Potassium: 4 mmol/L (ref 3.5–5.1)
Sodium: 139 mmol/L (ref 135–145)
Sodium: 138 mmol/L (ref 135–145)
Sodium: 135 mmol/L (ref 135–145)

## 2020-12-25 LAB — CBC
HCT: 32.2 % — ABNORMAL LOW (ref 36.0–46.0)
Hemoglobin: 11.2 g/dL — ABNORMAL LOW (ref 12.0–15.0)
MCH: 29.2 pg (ref 26.0–34.0)
MCHC: 34.8 g/dL (ref 30.0–36.0)
MCV: 84.1 fL (ref 80.0–100.0)
Platelets: 196 10*3/uL (ref 150–400)
RBC: 3.83 MIL/uL — ABNORMAL LOW (ref 3.87–5.11)
RDW: 13.2 % (ref 11.5–15.5)
WBC: 9.4 10*3/uL (ref 4.0–10.5)
nRBC: 0 % (ref 0.0–0.2)

## 2020-12-25 LAB — GLUCOSE, CAPILLARY: Glucose-Capillary: 106 mg/dL — ABNORMAL HIGH (ref 70–99)

## 2020-12-25 LAB — VITAMIN D 25 HYDROXY (VIT D DEFICIENCY, FRACTURES): Vit D, 25-Hydroxy: 60.93 ng/mL (ref 30–100)

## 2020-12-25 MED ORDER — GADOBUTROL 1 MMOL/ML IV SOLN
8.0000 mL | Freq: Once | INTRAVENOUS | Status: AC | PRN
  Administered 2020-12-25: 8 mL via INTRAVENOUS

## 2020-12-25 MED ORDER — LABETALOL HCL 5 MG/ML IV SOLN
10.0000 mg | INTRAVENOUS | Status: AC | PRN
  Administered 2020-12-25 – 2020-12-26 (×2): 10 mg via INTRAVENOUS
  Filled 2020-12-25 (×2): qty 4

## 2020-12-25 MED ORDER — LORAZEPAM 2 MG/ML IJ SOLN: 1.0000 mg | INTRAMUSCULAR | Status: AC

## 2020-12-25 MED ORDER — POTASSIUM CHLORIDE CRYS ER 20 MEQ PO TBCR
40.0000 meq | EXTENDED_RELEASE_TABLET | ORAL | Status: AC
  Administered 2020-12-25: 40 meq via ORAL
  Filled 2020-12-25: qty 2

## 2020-12-25 MED ORDER — LABETALOL HCL 100 MG PO TABS
100.0000 mg | ORAL_TABLET | Freq: Two times a day (BID) | ORAL | Status: DC
  Administered 2020-12-26: 100 mg via ORAL
  Filled 2020-12-25 (×3): qty 1

## 2020-12-25 MED ORDER — POTASSIUM CHLORIDE 20 MEQ PO PACK
40.0000 meq | PACK | Freq: Once | ORAL | Status: AC
  Administered 2020-12-25: 40 meq via ORAL
  Filled 2020-12-25: qty 2

## 2020-12-25 MED ORDER — LABETALOL HCL 5 MG/ML IV SOLN
5.0000 mg | INTRAVENOUS | Status: DC | PRN
  Administered 2020-12-25: 19:00:00 5 mg via INTRAVENOUS
  Filled 2020-12-25: qty 4

## 2020-12-25 MED ORDER — SODIUM CHLORIDE 0.9 % IV SOLN: INTRAVENOUS | Status: AC

## 2020-12-25 NOTE — Progress Notes (Signed)
  Chaplain On-Call responded to Code Stroke notification.  The patient was not available for a visit due to the Code Stroke Team attending to her.  Chaplain received medical update from [place], who stated that the patient soon will receive an MRI.  There is no family present.  Chaplains are available as needed for additional support.  Chaplain Pollyann Samples M.Div., [facility]

## 2020-12-25 NOTE — Progress Notes (Signed)
PT Cancellation Note  Patient Details Name: Robin Jensen MRN: [id number] DOB: 10/21/1960   Cancelled Treatment:    Reason Eval/Treat Not Completed: Medical issues which prohibited therapy Pt with elevated BP, had some down some but spoke to nursing to reports still not appropriate.  She also states that pt has had variable unilateral weakness and that they are going to image her brain.  Asks [name] to hold PT until tomorrow.    Kreg Shropshire, DPT 12/25/2020, 4:03 PM

## 2020-12-25 NOTE — Consult Note (Signed)
[facility] Telemedicine Consult  Requesting Provider: Opyd, T Consult Participants: Dr. Myna Hidalgo, Bedside nurses, patient Location of the provider: [place], [place] Location of the patient: [facility]  This consult was provided via telemedicine with 2-way video and audio communication. The patient/family was informed that care would be provided in this way and agreed to receive care in this manner.    Chief Complaint: Blurred Vision  HPI: 60 yo F with a history of bells palsy affecting the right who presented with syncope in the setting of severe hypocalcemia.  She also had blurred vision which she states is worse on the left than the right, though even with the left eye covered she still states that it is blurry on the left side.  She had not mentioned this, per nursing, until 5:30 PM this evening and therefore a code stroke was activated.  After discussion with the patient, however, she states that her vision has been blurred ever since prior to arrival in the hospital.     LKW: Prior to arrival at the hospital yesterday afternoon at 1:15 PM tpa given?: No, outside of window IR Thrombectomy? No, outside of window Time of teleneurologist evaluation: 19:42  Exam: Vitals:   12/25/20 1806 12/25/20 1915  BP: (!) 177/105 (!) 191/99  Pulse: 73 71  Resp:    Temp:    SpO2: 99% 97%    General: In bed, no apparent distress  1A: Level of Consciousness - 0 1B: Ask Month and Age - 0, though unable to give year 1C: 'Blink Eyes' & 'Squeeze Hands' - 0 2: Test Horizontal Extraocular Movements - 0 3: Test Visual Fields -she has an inconsistent response, unable to see movements in any peripheral field though she is fixating and tracking normally, 1 4: Test Facial Palsy -1, right face 5A: Test Left Arm Motor Drift -0 5B: Test Right Arm Motor Drift - 0 6A: Test Left Leg Motor Drift - 0 6B: Test Right Leg Motor Drift - 0 7: Test Limb Ataxia - 0 8: Test Sensation -  0 9: Test Language/Aphasia- 0 10: Test Dysarthria - 0 11: Test Extinction/Inattention - 1, she identifies stimulus on bilateral arms as occurring on the right NIHSS score: 3   Imaging Reviewed: CT head from 9/2 is negative  Labs reviewed in epic and pertinent values follow: Calcium 8.8, increased from 5.8 on admission Creatinine 0.99 Potassium 4.0 Glucose 106  Assessment: 60 year old female with blurred vision in the setting of severe hypertension and hypocalcemia.  I suspect that her right facial weakness may simply be recrudescence in the setting of physiological stress of her previous Bell's palsy.  Her visual change could certainly be related to hypertension, either through retinal edema or posterior reversible encephalopathy syndrome(PRES).   Given her symptoms have been present for more than 24 hours and were present prior to the CT performed yesterday, no need for acute stroke code CT we can take her for MRI imaging instead, which was already scheduled for now anyway.  I have low suspicion for acute seizure.  If her MRI reveals acute ischemic stroke, then I would allow permissive hypertension, otherwise I would favor treating this as hypertensive emergency.  I will also get angiography, though she is technically outside the window for stroke intervention.  Recommendations: 1) MRI brain 2) MR angiogram head and neck 3) if there is an acute ischemic stroke, would start antiplatelet therapy with aspirin 4) continue home Dilantin, gabapentin, lamotrigine 5) I will see the patient in  person tomorrow.  This patient is receiving care for possible acute neurological changes. There was 56 minutes of care by this provider at the time of service, including time for direct evaluation via telemedicine, review of medical records, imaging studies and discussion of findings with providers, the patient and/or family.  Roland Rack, MD [facility] [phone]  If 7pm- 7am,  please page neurology on call as listed in [place].

## 2020-12-25 NOTE — Evaluation (Addendum)
Occupational Therapy Evaluation Patient Details Name: Robin Jensen MRN: [id number] DOB: July 03, 1960 Today's Date: 12/25/2020    History of Present Illness Pt. is a 60 y.o. female who was admitted to [facility]  following an 45 min. episode of unresponsiveness, and Hypocalcemia.PMHx includes: Bipolar I Disorder, Aneurysm s/p clipping, Uterine CA, Depression, DVT, Hypothyroidism, Seizure Disorder.   Clinical Impression   Pt. presents with weakness, limited activity tolerance, and history of falls from right knee buckling which limits her ability to complete basic ADL and IADL functioning independently.  Pt. Resides at home alone, however has a significant other who is home intermittently every couple of weeks. Pt. Has a son who lives close by, and a daughter in law who assists the pt. As needed. Pt. Reports being independent with ADLs, and IADL functioning, however does not drive.  Pt. endorses having had multiple falls due to her right leg buckling. Pt. Required Supervision to walk to the bathroom, independent toilet hygiene, supervision for clothing negotiation, and standing at the sinkside to complete self-grooming tasks. Pt. Could benefit from OT services for ADL training, A/E training, and pt. Education about home modification, and DME. Pt. Plans to return home upon discharge with family to assist pt. as needed. Pt. Could benefit from follow-up [place] services upon discharge.     Follow Up Recommendations  Home health OT    Equipment Recommendations  None recommended by OT    Recommendations for Other Services       Precautions / Restrictions        Mobility Bed Mobility Overal bed mobility: Independent                  Transfers Overall transfer level: Needs assistance Equipment used: Rolling walker (2 wheeled) Transfers: Sit to/from Stand           General transfer comment: Wlaking to the bathroom  with Supervision.    Balance Overall balance assessment: Needs  assistance Sitting-balance support: No upper extremity supported Sitting balance-Leahy Scale: Good     Standing balance support: Single extremity supported Standing balance-Leahy Scale: Good                             ADL either performed or assessed with clinical judgement   ADL Overall ADL's : Needs assistance/impaired     Grooming: Standing;Supervision/safety           Upper Body Dressing : Set up;Independent   Lower Body Dressing: Independent;Set up   Toilet Transfer: Supervision/safety           Functional mobility during ADLs: Supervision/safety       Vision Baseline Vision/History: 1 Wears glasses Patient Visual Report: No change from baseline       Perception     Praxis      Pertinent Vitals/Pain Pain Assessment: No/denies pain     Hand Dominance Right   Extremity/Trunk Assessment Upper Extremity Assessment Upper Extremity Assessment: Generalized weakness           Communication Communication Communication: No difficulties   Cognition Arousal/Alertness: Awake/alert Behavior During Therapy: WFL for tasks assessed/performed Overall Cognitive Status: Within Functional Limits for tasks assessed                                     General Comments       Exercises     Shoulder Instructions  Home Living Family/patient expects to be discharged to:: Private residence Living Arrangements: Spouse/significant other (Son lives closeby, daughter in law assists pt. as needed.) Available Help at Discharge: Available 24 hours/day Type of Home: Mobile home Home Access: Stairs to enter Technical brewer of Steps:    Home Layout: One level     Bathroom Shower/Tub: Tub/shower unit;Curtain         Home Equipment: Environmental consultant - 2 wheels;Grab bars - tub/shower;Shower seat;Hand held shower head          Prior Functioning/Environment Level of Independence: Needs assistance                 OT Problem  List: Decreased strength      OT Treatment/Interventions: Self-care/ADL training;Therapeutic exercise;Patient/family education;Therapeutic activities;DME and/or AE instruction    OT Goals(Current goals can be found in the care plan section) Acute Rehab OT Goals Patient Stated Goal: To retun home OT Goal Formulation: With patient Time For Goal Achievement: 01/15/21 Potential to Achieve Goals: Good  OT Frequency: Min 1X/week   Barriers to D/C:            Co-evaluation              AM-PAC OT "6 Clicks" Daily Activity     Outcome Measure Help from another person eating meals?: None Help from another person taking care of personal grooming?: A Little Help from another person toileting, which includes using toliet, bedpan, or urinal?: A Little Help from another person bathing (including washing, rinsing, drying)?: A Little Help from another person to put on and taking off regular upper body clothing?: None Help from another person to put on and taking off regular lower body clothing?: None 6 Click Score: 21   End of Session Equipment Utilized During Treatment: Gait belt  Activity Tolerance: Patient tolerated treatment well Patient left: in bed;with bed alarm set;with chair alarm set  OT Visit Diagnosis: Muscle weakness (generalized) (M62.81);History of falling (Z91.81)                Time: [id number] OT Time Calculation (min): 40 min Charges:  OT General Charges $OT Visit: 1 Visit OT Evaluation $OT Eval Moderate Complexity: 1 Mod Harrel Carina, MS, OTR/L   Harrel Carina 12/25/2020, 11:41 AM

## 2020-12-25 NOTE — Progress Notes (Signed)
PROGRESS NOTE    Robin Jensen  [id number] DOB: 16-Jun-1960 DOA: 12/24/2020 PCP: Donnie Coffin, MD    Chief Complaint  Patient presents with   Hypotension    Brief Narrative:  H/o  affective disorder bipolar type, hypothyroidism from prior lithium therapy,  cerebral aneurysm status post clips, seizure disorder, memory issues on aricept hypertension, who presents with falls and an episode of syncope as well as new hypocalcemia, hypokalemia, and hypomagnesemia.   Subjective:  Still feel weak, she does not remember the year, know the date, know she is in the hospital Reports last seizure was " a while ago" this year Reports passed out twice this year Reports chronic right knee pain   Assessment & Plan:   Principal Problem:   Hypocalcemia Active Problems:   Essential hypertension, benign   Aneurysm, cerebral, nonruptured   Hypothyroidism   Seizures (HCC)   Borderline personality disorder (HCC)   Loss of memory   Schizoaffective disorder, bipolar type (HCC)   Syncope -Appear dehydrated, with orthostatic hypotension, start hydration -EKG with sinus rhythm, QTC is 474, continue telemetry -CT of the head no acute findings on presentation, right facial droop not sure if is new, will get MRI of the brain -Not able to obtain clear history, will benefit outpatient cardiac monitoring as patient report she had 2 episode this year  History of seizure disorder Report last seizure a while ago this year On seizure precaution Appear to be on Lamictal, phenytoin, Valium at home -Valproic acid less than 10, Lamictal and phenytoin level in process  Hypertension Appears on labetalol in the past, but I do not see on current list Will start labetalol with low-dose and titrate up Continue hydralazine Losartan Hold HCTZ Appear also on verapamil will continue for now Blood pressure is not well controlled need to continue titrate blood pressure medication  Hypocalcemia PTH,  ionized calcium, calcitriol, vitamin D in process Replaced improved  Hypomagnesemia/hypokalemia Replaced and improved Hold HCTZ  Falls/weakness/right knee pain Will get physical therapy   The patient's BMI is: Body mass index is 35.66 kg/m.Marland Kitchen       Unresulted Labs (From admission, onward)     Start     Ordered   12/24/20 2203  Phenytoin level, free and total  Once,   R        12/24/20 2203   12/24/20 2201  Lamotrigine level  Once,   R        12/24/20 2203   12/24/20 0000000  Basic metabolic panel  Now then every 6 hours,   TIMED      12/24/20 2203   12/24/20 1746  Calcium, ionized  Once,   STAT        12/24/20 1745   12/24/20 1716  Calcitriol (1,25 di-OH Vit D)  Once,   STAT        12/24/20 1715   12/24/20 1715  PTH, intact and calcium  Once,   STAT        12/24/20 1715              DVT prophylaxis: SCDs Start: 12/24/20 2200   Code Status: Full Family Communication: Patient Disposition:   Status is: Inpatient  Dispo: The patient is from: Home              Anticipated d/c is to: TBD              Anticipated d/c date is: TBD  Consultants:  None  Procedures:  None  Antimicrobials:   Anti-infectives (From admission, onward)    None           Objective: Vitals:   12/24/20 2214 12/24/20 2357 12/25/20 0528 12/25/20 0802  BP: (!) 192/99 (!) 166/92 (!) 163/95 (!) 200/100  Pulse: 77 75 73 73  Resp: '16 20 18 18  '$ Temp: 98.8 F (37.1 C) 98.8 F (37.1 C) 98.2 F (36.8 C) 97.8 F (36.6 C)  TempSrc:   Oral   SpO2: 99% 98% 97% 98%  Weight:      Height:        Intake/Output Summary (Last 24 hours) at 12/25/2020 1051 Last data filed at 12/25/2020 1047 Gross per 24 hour  Intake 483 ml  Output --  Net 483 ml   Filed Weights   12/24/20 1354 12/24/20 2200  Weight: 81.2 kg 85.6 kg    Examination:  General exam: weak, speech is slightly delayed  Respiratory system: Clear to auscultation. Respiratory effort normal. Cardiovascular  system: S1 & S2 heard, RRR. No JVD, no murmur, No pedal edema. Gastrointestinal system: Abdomen is nondistended, soft and nontender. No organomegaly or masses felt. Normal bowel sounds heard. Central nervous system: Alert and oriented. No focal neurological deficits. Extremities: No edema, right knee medial compartment tenderness, no erythema Skin: No rashes, lesions or ulcers Psychiatry: speech is slightly delayed, with mild memory impairment     Data Reviewed: I have personally reviewed following labs and imaging studies  CBC: Recent Labs  Lab 12/24/20 1357 12/25/20 0403  WBC 8.3 9.4  HGB 10.2* 11.2*  HCT 31.7* 32.2*  MCV 90.3 84.1  PLT 164 [id number]    Basic Metabolic Panel: Recent Labs  Lab 12/24/20 1357 12/24/20 2202 12/25/20 0403  NA 140 138 139  K 2.7* 3.6 3.1*  CL 118* 102 103  CO2 18* 28 28  GLUCOSE 70 138* 87  BUN '13 16 13  '$ CREATININE 0.93 1.20* 1.03*  CALCIUM 5.8* 9.2 9.5  MG 1.6* 1.9  --   PHOS  --  3.1  --     GFR: Estimated Creatinine Clearance: 57.7 mL/min (A) (by C-G formula based on SCr of 1.03 mg/dL (H)).  Liver Function Tests: No results for input(s): AST, ALT, ALKPHOS, BILITOT, PROT, ALBUMIN in the last 168 hours.  CBG: No results for input(s): GLUCAP in the last 168 hours.   Recent Results (from the past 240 hour(s))  Resp Panel by RT-PCR (Flu A&B, Covid) Nasopharyngeal Swab     Status: None   Collection Time: 12/24/20  4:59 PM   Specimen: Nasopharyngeal Swab; Nasopharyngeal(NP) swabs in vial transport medium  Result Value Ref Range Status   SARS Coronavirus 2 by RT PCR NEGATIVE NEGATIVE Final    Comment: (NOTE) SARS-CoV-2 target nucleic acids are NOT DETECTED.  The SARS-CoV-2 RNA is generally detectable in upper respiratory specimens during the acute phase of infection. The lowest concentration of SARS-CoV-2 viral copies this assay can detect is 138 copies/mL. A negative result does not preclude SARS-Cov-2 infection and should not be  used as the sole basis for treatment or other patient management decisions. A negative result may occur with  improper specimen collection/handling, submission of specimen other than nasopharyngeal swab, presence of viral mutation(s) within the areas targeted by this assay, and inadequate number of viral copies(<138 copies/mL). A negative result must be combined with clinical observations, patient history, and epidemiological information. The expected result is Negative.  Fact Sheet for Patients:  EntrepreneurPulse.com.au  Fact Sheet for Healthcare Providers:  IncredibleEmployment.be  This test is no t yet approved or cleared by the [place] FDA and  has been authorized for detection and/or diagnosis of SARS-CoV-2 by FDA under an Emergency Use Authorization (EUA). This EUA will remain  in effect (meaning this test can be used) for the duration of the COVID-19 declaration under Section 564(b)(1) of the Act, 21 U.S.C.section 360bbb-3(b)(1), unless the authorization is terminated  or revoked sooner.       Influenza A by PCR NEGATIVE NEGATIVE Final   Influenza B by PCR NEGATIVE NEGATIVE Final    Comment: (NOTE) The Xpert Xpress SARS-CoV-2/FLU/RSV plus assay is intended as an aid in the diagnosis of influenza from Nasopharyngeal swab specimens and should not be used as a sole basis for treatment. Nasal washings and aspirates are unacceptable for Xpert Xpress SARS-CoV-2/FLU/RSV testing.  Fact Sheet for Patients: EntrepreneurPulse.com.au  Fact Sheet for Healthcare Providers: IncredibleEmployment.be  This test is not yet approved or cleared by the [place] FDA and has been authorized for detection and/or diagnosis of SARS-CoV-2 by FDA under an Emergency Use Authorization (EUA). This EUA will remain in effect (meaning this test can be used) for the duration of the COVID-19 declaration under Section  564(b)(1) of the Act, 21 U.S.C. section 360bbb-3(b)(1), unless the authorization is terminated or revoked.  Performed at [facility], [address], [place], [place] [address]          Radiology Studies: DG Chest 2 View  Result Date: 12/24/2020 CLINICAL DATA:  Syncope. EXAM: CHEST - 2 VIEW COMPARISON:  None. FINDINGS: The heart size and mediastinal contours are within normal limits. Both lungs are clear. The visualized skeletal structures are unremarkable. IMPRESSION: No active cardiopulmonary disease. Electronically Signed   By: Marijo Conception M.D.   On: 12/24/2020 14:48   CT HEAD WO CONTRAST (5MM)  Result Date: 12/24/2020 CLINICAL DATA:  Altered mental status, history of seizures, remote aneurysm status post clipping EXAM: CT HEAD WITHOUT CONTRAST TECHNIQUE: Contiguous axial images were obtained from the base of the skull through the vertex without intravenous contrast. COMPARISON:  MRI brain dated 04/28/2019 FINDINGS: Brain: No evidence of acute infarction, hemorrhage, hydrocephalus, extra-axial collection or mass lesion/mass effect. Subcortical white matter and periventricular small vessel ischemic changes. Vascular: Postprocedural changes related to right ICA aneurysm clipping. Skull: Normal. Negative for fracture or focal lesion. Right frontal craniotomy. Sinuses/Orbits: The visualized paranasal sinuses are essentially clear. The mastoid air cells are unopacified. Other: None. IMPRESSION: No evidence of acute intracranial abnormality. Postprocedural changes related to old right ICA aneurysm clipping. Small vessel ischemic changes. Electronically Signed   By: Julian Hy M.D.   On: 12/24/2020 21:58        Scheduled Meds:  atorvastatin  40 mg Oral QHS   benztropine  2 mg Oral Daily   busPIRone  10 mg Oral TID   diazepam  5 mg Oral QHS   gabapentin  300 mg Oral TID   hydrALAZINE  25 mg Oral BID   losartan  100 mg Oral Daily   And   hydrochlorothiazide  25 mg  Oral Daily   lamoTRIgine  200 mg Oral Daily   levothyroxine  75 mcg Oral Q0600   linaclotide  72 mcg Oral QAC breakfast   lurasidone  120 mg Oral QHS   meloxicam  7.5 mg Oral BID   pantoprazole  40 mg Oral BID   phenytoin  400 mg Oral Daily   potassium chloride  40 mEq Oral Q4H   risperiDONE  0.5 mg Oral Daily   sodium chloride flush  3 mL Intravenous Q12H   valbenazine  80 mg Oral QHS   venlafaxine XR  150 mg Oral QHS   verapamil  120 mg Oral QHS   verapamil  120 mg Oral q morning   And   verapamil  180 mg Oral q morning   Continuous Infusions:   LOS: 1 day   Time spent: 35 mins Greater than 50% of this time was spent in counseling, explanation of diagnosis, planning of further management, and coordination of care.   Voice Recognition Viviann Spare dictation system was used to create this note, attempts have been made to correct errors. Please contact the author with questions and/or clarifications.   Florencia Reasons, MD PhD FACP [facility]  Available via Epic secure chat 7am-7pm for nonurgent issues Please page for urgent issues To page the attending provider between 7A-7P or the covering provider during after hours 7P-7A, please log into the web site [email or web address] and access using universal [place] password for that web site. If you do not have the password, please call the hospital operator.    12/25/2020, 10:51 AM

## 2020-12-26 LAB — BASIC METABOLIC PANEL
Anion gap: 8 (ref 5–15)
BUN: 14 mg/dL (ref 6–20)
CO2: 26 mmol/L (ref 22–32)
Calcium: 9.1 mg/dL (ref 8.9–10.3)
Chloride: 102 mmol/L (ref 98–111)
Creatinine, Ser: 1.05 mg/dL — ABNORMAL HIGH (ref 0.44–1.00)
GFR, Estimated: >60 mL/min (ref >60)
Glucose, Bld: 120 mg/dL — ABNORMAL HIGH (ref 70–99)
Potassium: 3.7 mmol/L (ref 3.5–5.1)
Sodium: 136 mmol/L (ref 135–145)

## 2020-12-26 LAB — CORTISOL: Cortisol, Plasma: 18.7 ug/dL

## 2020-12-26 LAB — CALCIUM, IONIZED: Calcium, Ionized, Serum: 5.3 mg/dL (ref 4.5–5.6)

## 2020-12-26 LAB — MAGNESIUM: Magnesium: 1.8 mg/dL (ref 1.7–2.4)

## 2020-12-26 MED ORDER — LABETALOL HCL 200 MG PO TABS
600.0000 mg | ORAL_TABLET | Freq: Two times a day (BID) | ORAL | Status: DC
  Filled 2020-12-26: qty 3

## 2020-12-26 MED ORDER — DONEPEZIL HCL 5 MG PO TABS
10.0000 mg | ORAL_TABLET | Freq: Every day | ORAL | Status: DC
  Administered 2020-12-26: 10 mg via ORAL
  Filled 2020-12-26: qty 2

## 2020-12-26 MED ORDER — LABETALOL HCL 200 MG PO TABS
300.0000 mg | ORAL_TABLET | Freq: Two times a day (BID) | ORAL | Status: DC
  Administered 2020-12-26 – 2020-12-27 (×2): 300 mg via ORAL
  Filled 2020-12-26 (×3): qty 1

## 2020-12-26 MED ORDER — METOCLOPRAMIDE HCL 5 MG/ML IJ SOLN
10.0000 mg | Freq: Once | INTRAMUSCULAR | Status: AC
  Administered 2020-12-26: 10 mg via INTRAVENOUS
  Filled 2020-12-26: qty 2

## 2020-12-26 MED ORDER — MELOXICAM 7.5 MG PO TABS
7.5000 mg | ORAL_TABLET | Freq: Every day | ORAL | Status: DC
  Administered 2020-12-27: 09:00:00 7.5 mg via ORAL
  Filled 2020-12-26: qty 1

## 2020-12-26 MED ORDER — GABAPENTIN 300 MG PO CAPS: 300.0000 mg | ORAL_CAPSULE | Freq: Every day | ORAL | Status: DC

## 2020-12-26 MED ORDER — POTASSIUM CHLORIDE CRYS ER 20 MEQ PO TBCR
40.0000 meq | EXTENDED_RELEASE_TABLET | Freq: Once | ORAL | Status: AC
  Administered 2020-12-26: 40 meq via ORAL
  Filled 2020-12-26: qty 2

## 2020-12-26 MED ORDER — MAGNESIUM SULFATE 2 GM/50ML IV SOLN
2.0000 g | Freq: Once | INTRAVENOUS | Status: AC
  Administered 2020-12-26: 13:00:00 2 g via INTRAVENOUS
  Filled 2020-12-26: qty 50

## 2020-12-26 NOTE — Progress Notes (Signed)
Dayshift and nightshift were in bedside report when patient mentioned her left eye was blurry. Nurse noticed right facial droop. NIH scale was done with a result of 4. Code stroke was called. See neurology note.

## 2020-12-26 NOTE — Progress Notes (Addendum)
   12/26/20 2152  Assess: MEWS Score  Temp 99.3 F (37.4 C)  BP (!) 210/92  Pulse Rate 85  Resp 16  SpO2 96 %  Assess: MEWS Score  MEWS Temp 0  MEWS Systolic 2  MEWS Pulse 0  MEWS RR 0  MEWS LOC 0  MEWS Score 2  MEWS Score Color Yellow  Assess: if the MEWS score is Yellow or Red  Were vital signs taken at a resting state? Yes  Focused Assessment No change from prior assessment  Does the patient meet 2 or more of the SIRS criteria? No  MEWS guidelines implemented *See Row Information* Yes  Treat  MEWS Interventions Escalated (See documentation below);Administered scheduled meds/treatments;Administered prn meds/treatments  Pain Scale 0-10  Patients Stated Pain Goal 0  Take Vital Signs  Increase Vital Sign Frequency  Yellow: Q 2hr X 2 then Q 4hr X 2, if remains yellow, continue Q 4hrs  Escalate  MEWS: Escalate Yellow: discuss with charge nurse/RN and consider discussing with provider and RRT  Notify: Charge Nurse/RN  Name of Charge Nurse/RN Notified Christina RN  Date Charge Nurse/RN Notified 12/26/20  Time Charge Nurse/RN Notified 2212  Assess: SIRS CRITERIA  SIRS Temperature  0  SIRS Pulse 0  SIRS Respirations  0  SIRS WBC 0  SIRS Score Sum  0    12/26/20 2152  Notify: Provider  Provider Name/Title Mitzi Hansen MD  Date Provider Notified 12/26/20  Time Provider Notified 2152  Notification Type Page  Notification Reason Change in status  Provider response Evaluate remotely  Date of Provider Response 12/26/20  Time of Provider Response 2223  Document  Patient Outcome Other (Comment) (continue to monitor)  Progress note created (see row info) Yes    Per provider - notify MD if next SBP >180 or DBP >100 for additional medication.  Pt also refused Venlafaxine stating she does not take this medication any longer - Opyd, MD notified.

## 2020-12-26 NOTE — Progress Notes (Signed)
Subjective: Still with little bit of blurred vision.  She states that she gets the blurred vision from time to time, and it comes and goes.  It is nothing new for her.  She describes it as occurring mainly in the left eye, but both eyes are affected.  She does have some headache.  Exam: Vitals:   12/26/20 0439 12/26/20 0725  BP: (!) 183/93 (!) 158/101  Pulse: 75 80  Resp: 18 16  Temp: 98.7 F (37.1 C) 99 F (37.2 C)  SpO2: 100% 94%   Gen: In bed, NAD Resp: non-labored breathing, no acute distress Abd: soft, nt  Neuro: MS: Awake, alert, oriented to month and location but not year. CN: Pupils are equal round and reactive, she is able to see fingers wiggling in all four quadrants bilaterally.  She has mild right facial weakness (she reports old from previous Bell's palsy) Motor: 5/5 throughout Sensory: Intact to light touch, she does not extinguish when I am testing.  I am uncertain if this was a real finding over the video link yesterday.  Pertinent Labs: Creatinine 1.05  Impression: 60 year old female with blurred vision in the setting of severe hypertension after presenting with severe hypocalcemia.  At this time, given her description of the waxing/waning nature, it is possible that this represents hypertensive urgency.  Also possible for waxing/waning blurred vision would be migraine, and would be reasonable to try Reglan, though I feel this is less likely.  Recommendations: 1) will try Reglan x1 2) otherwise, would treat as hypertensive urgency/emergency 3) follow-up with outpatient ophthalmology 4) no other neurological recommendations at this time, please call with further questions or concerns.  Roland Rack, MD [facility] [phone]  If 7pm- 7am, please page neurology on call as listed in [place].

## 2020-12-26 NOTE — Evaluation (Signed)
Physical Therapy Evaluation Patient Details Name: Robin Jensen MRN: [id number] DOB: [birth year] Today's Date: 12/26/2020   History of Present Illness  EILEE CURRIER is a 60 y.o. female with a past medical history of anxiety, bipolar, gastric reflux, hypertension, hyperlipidemia, seizure disorder, presents to the emergency department for generalized fatigue weakness a fall and a loss of consciousness.   Clinical Impression  Pt admitted with above diagnosis. Pt received supine in bed agreeable to PT services. Pt's PLOF is indep with ADL's/IADL's does have hx of falls due to RLE buckling. Pt does not drive. Vitals assessed prior to mobility due to elevated BP during acute admission. See below. Pt supervision for bed mobility to sit EOB. SBA to stand pivot to [facility] to void bladder. Did require x2 attempts to stand. Indep with perihygiene seated and SBA to stand from [facility]. Pt cautiously ambulated with minguard and use of IV pole for SUE support 64' with x2 minor RLE buckling that pt corrected with no external assist. Very shortened step length bilat almost to shuffling pattern. Requesting to return to bed. Pt educated on use of RW during acute stay and at home due to hx of falls at home and x2 RLE buckling during PT session for external support to decrease falls risk. Initially recommending [place] [facility] but pt denying due to having aggressive dog at home. Pt educated on benefits of OP PT to improve balance, strength, and RLE to reduce buckling and risk of falls and pt agreeable to referral. Attending MD and case management notified. With use of RW, pt is safe to return to home environment with family support and household ambulation. Pt currently with functional limitations due to the deficits listed below (see PT Problem List). Pt will benefit from skilled PT to increase their independence and safety with mobility to allow discharge to the venue listed below.      Vitals:  -Supine: 283/118 mm Hg (Believe  this is false reading from BP cuff being tight on pt's RUE  -Supine re-attempt after 2 min rest with BP cuff looser on RUE: 142/102 mm Hg  -Post amb: 113/87 mm Hg     Home health PT;Outpatient PT;Other (comment) (Recommend HH PT initially. Pt prefers OP PT for balance and RLE strengthening (hx of buckling causing falls) due to having aggressive dog at home.)    Equipment Recommendations  None recommended by PT    Recommendations for Other Services       Precautions / Restrictions Precautions Precautions: Fall Precaution Comments: RLE hx of buckling. x2 minor RLE buckling with PT. Restrictions Weight Bearing Restrictions: No      Mobility  Bed Mobility Overal bed mobility: Independent               Patient Response: Cooperative  Transfers Overall transfer level: Needs assistance Equipment used:  (iv pole) Transfers: Sit to/from Stand           General transfer comment: x2 attempts to stand with no AD but able to without external physical assist.  Ambulation/Gait Ambulation/Gait assistance: Min guard Gait Distance (Feet): 40 Feet Assistive device: IV Pole Gait Pattern/deviations: Step-to pattern;Shuffle;Narrow base of support Gait velocity: Cautious and decreased from base line per subjective report.   General Gait Details: x2 minor bouts of RLE buckling. Able to correct without physical assist. No LOB.  Stairs            Wheelchair Mobility    Modified Rankin (Stroke Patients Only)  Balance Overall balance assessment: Needs assistance Sitting-balance support: No upper extremity supported Sitting balance-Leahy Scale: Good     Standing balance support: During functional activity;Single extremity supported Standing balance-Leahy Scale: Good                               Pertinent Vitals/Pain Pain Assessment: No/denies pain    Home Living Family/patient expects to be discharged to:: Private residence Living Arrangements:  Spouse/significant other (family close by to assist as needed.)   Type of Home: Mobile home Home Access: Level entry     Home Layout: One level Home Equipment: Walker - 2 wheels;Grab bars - tub/shower;Shower seat;Hand held shower head      Prior Function Level of Independence: Needs assistance               Hand Dominance   Dominant Hand: Right    Extremity/Trunk Assessment   Upper Extremity Assessment Upper Extremity Assessment: Generalized weakness    Lower Extremity Assessment Lower Extremity Assessment: Generalized weakness;RLE deficits/detail RLE Deficits / Details: x2 minor cases of buckling.    Cervical / Trunk Assessment Cervical / Trunk Assessment: Normal  Communication   Communication: No difficulties  Cognition Arousal/Alertness: Awake/alert Behavior During Therapy: WFL for tasks assessed/performed Overall Cognitive Status: Within Functional Limits for tasks assessed                                        General Comments      Exercises General Exercises - Lower Extremity Ankle Circles/Pumps: AROM;Supine;Both;10 reps Other Exercises Other Exercises: Role of PT in acute setting, benefits of OP PT to reduce risk of falls d/t RLE buckling prior to current admission. D/c recs.   Assessment/Plan    PT Assessment Patient needs continued PT services  PT Problem List Decreased strength;Decreased mobility;Decreased safety awareness;Decreased activity tolerance;Decreased balance;Decreased knowledge of use of DME       PT Treatment Interventions DME instruction;Therapeutic exercise;Gait training;Balance training;Neuromuscular re-education;Functional mobility training;Therapeutic activities;Patient/family education    PT Goals (Current goals can be found in the Care Plan section)  Acute Rehab PT Goals Patient Stated Goal: To retun home Time For Goal Achievement: 01/09/21 Potential to Achieve Goals: Good    Frequency Min 2X/week    Barriers to discharge        Co-evaluation               AM-PAC PT "6 Clicks" Mobility  Outcome Measure Help needed turning from your back to your side while in a flat bed without using bedrails?: None Help needed moving from lying on your back to sitting on the side of a flat bed without using bedrails?: A Little Help needed moving to and from a bed to a chair (including a wheelchair)?: A Little Help needed standing up from a chair using your arms (e.g., wheelchair or bedside chair)?: A Little Help needed to walk in hospital room?: A Little Help needed climbing 3-5 steps with a railing? : A Lot 6 Click Score: 18    End of Session Equipment Utilized During Treatment: Gait belt Activity Tolerance: Patient tolerated treatment well Patient left: in bed;with call bell/phone within reach;with bed alarm set Nurse Communication: Mobility status PT Visit Diagnosis: Unsteadiness on feet (R26.81);History of falling (Z91.81);Muscle weakness (generalized) (M62.81)    Time: [id number] PT Time Calculation (min) (ACUTE ONLY): 32 min  Charges:   PT Evaluation $PT Eval Moderate Complexity: 1 Mod PT Treatments $Therapeutic Activity: 23-37 mins       Odis Turck M. Fairly IV, PT, DPT Physical Therapist- [facility]  12/26/2020, 10:14 AM

## 2020-12-26 NOTE — Progress Notes (Signed)
(  1910) Arrived to patients bedside due to  [place] call being activated.  On arrival patient a/o x3 with complaints of blurred vision and the care RN reporting R sided facial droop. AC/myself stayed bedside with the patient to facilitate video call with Neuro team and  patient to MRI. (2145) no plans for transfer to higher level of care at this time.

## 2020-12-26 NOTE — Progress Notes (Signed)
PROGRESS NOTE    Robin Jensen  [id number] DOB: 1960/12/05 DOA: 12/24/2020 PCP: Donnie Coffin, MD    Chief Complaint  Patient presents with   Hypotension    Brief Narrative:  H/o  affective disorder bipolar type, hypothyroidism from prior lithium therapy,  cerebral aneurysm status post clips, seizure disorder, memory issues on aricept hypertension, who presents with falls and an episode of syncope as well as new hypocalcemia, hypokalemia, and hypomagnesemia.   Subjective:   Code stroke activated last night due to acute vision changes makes extremely high blood pressure, MRI no acute findings, case discussed with neurology who thinks symptom is related to uncontrolled high blood pressure  Still feel weak, she does not remember the year, know the date, know she is in the hospital Reports last seizure was " a while ago" this year Reports passed out twice this year Reports chronic right knee pain  Chronic headache , the new injection EMgality  helped   Assessment & Plan:   Principal Problem:   Hypocalcemia Active Problems:   Essential hypertension, benign   Aneurysm, cerebral, nonruptured   Hypothyroidism   Seizures (HCC)   Borderline personality disorder (HCC)   Loss of memory   Schizoaffective disorder, bipolar type (HCC)   Syncope -Appears dehydrated on presentation, with orthostatic hypotension, started on  hydration and continue for another 24hrs -EKG with sinus rhythm, QTC is 474, continue telemetry -CT of the head no acute findings on presentation, right facial droop not sure if is new, will get MRI of the brain -Not able to obtain clear history, will benefit outpatient cardiac monitoring as patient report she had 2 episode this year -Tele here has been unremarkable   History of seizure disorder Report last seizure a while ago this year On seizure precaution Appear to be on Lamictal, phenytoin, Valium at home -Valproic acid less than 10, Lamictal and  phenytoin level in process  Hypertension Labile blood pressure with extremely high blood pressure to low blood pressure Continue to adjust blood pressure medication, currently on hydralazine 25 mg twice daily, labetalol 300 mg twice daily, losartan 100 mg daily, verapamil 120 mg daily Hold HCTZ due to dehydration and electrolyte abnormality  need to continue titrate blood pressure medication  Hypocalcemia PTH, ionized calcium, calcitriol in process  vitamin D 60 Replaced improved  Hypomagnesemia/hypokalemia Replaced and improved Hold HCTZ  Falls/weakness/right knee pain She declined home health, agreed to outpatient physical therapy, referral made   The patient's BMI is: Body mass index is 35.66 kg/m.Marland Kitchen       Unresulted Labs (From admission, onward)     Start     Ordered   12/25/20 1817  Urinalysis, Complete w Microscopic  Once,   R        12/25/20 1816   12/24/20 2203  Phenytoin level, free and total  Once,   R        12/24/20 2203   12/24/20 2201  Lamotrigine level  Once,   R        12/24/20 2203   12/24/20 1746  Calcium, ionized  Once,   STAT        12/24/20 1745   12/24/20 1716  Calcitriol (1,25 di-OH Vit D)  Once,   STAT        12/24/20 1715   12/24/20 1715  PTH, intact and calcium  Once,   STAT        12/24/20 1715  DVT prophylaxis: SCDs Start: 12/24/20 2200   Code Status: Full Family Communication: Patient Disposition:   Status is: Inpatient  Dispo: The patient is from: Home              Anticipated d/c is to:               Anticipated d/c date is: TBD                Consultants:  None  Procedures:  None  Antimicrobials:   Anti-infectives (From admission, onward)    None           Objective: Vitals:   12/25/20 2349 12/26/20 0439 12/26/20 0725 12/26/20 1121  BP: (!) 174/100 (!) 183/93 (!) 158/101 102/66  Pulse: 69 75 80 66  Resp: '16 18 16 16  '$ Temp: 98.1 F (36.7 C) 98.7 F (37.1 C) 99 F (37.2 C) 98.2 F  (36.8 C)  TempSrc:      SpO2: 99% 100% 94% 97%  Weight:      Height:        Intake/Output Summary (Last 24 hours) at 12/26/2020 1202 Last data filed at 12/26/2020 1037 Gross per 24 hour  Intake 794.36 ml  Output --  Net 794.36 ml   Filed Weights   12/24/20 1354 12/24/20 2200  Weight: 81.2 kg 85.6 kg    Examination:  General exam: weak, speech is slightly delayed , not oriented to year, oriented to the month and place, reports has baseline memory Respiratory system: Clear to auscultation. Respiratory effort normal. Cardiovascular system: S1 & S2 heard, RRR. No JVD, no murmur, No pedal edema. Gastrointestinal system: Abdomen is nondistended, soft and nontender. No organomegaly or masses felt. Normal bowel sounds heard. Central nervous system: Alert and oriented. No focal neurological deficits. Extremities: No edema, right knee medial compartment tenderness, no erythema Skin: No rashes, lesions or ulcers Psychiatry: speech is slightly delayed, with mild memory impairment     Data Reviewed: I have personally reviewed following labs and imaging studies  CBC: Recent Labs  Lab 12/24/20 1357 12/25/20 0403  WBC 8.3 9.4  HGB 10.2* 11.2*  HCT 31.7* 32.2*  MCV 90.3 84.1  PLT 164 [id number]    Basic Metabolic Panel: Recent Labs  Lab 12/24/20 1357 12/24/20 2202 12/25/20 0403 12/25/20 1013 12/25/20 1647 12/26/20 0449  NA 140 138 139 138 135 136  K 2.7* 3.6 3.1* 3.7 4.0 3.7  CL 118* 102 103 106 98 102  CO2 18* '28 28 26 29 26  '$ GLUCOSE 70 138* 87 146* 83 120*  BUN '13 16 13 13 12 14  '$ CREATININE 0.93 1.20* 1.03* 0.99 0.99 1.05*  CALCIUM 5.8* 9.2 9.5 9.2 8.8* 9.1  MG 1.6* 1.9  --   --   --  1.8  PHOS  --  3.1  --   --   --   --     GFR: Estimated Creatinine Clearance: 56.6 mL/min (A) (by C-G formula based on SCr of 1.05 mg/dL (H)).  Liver Function Tests: No results for input(s): AST, ALT, ALKPHOS, BILITOT, PROT, ALBUMIN in the last 168 hours.  CBG: Recent Labs  Lab  12/25/20 1943  GLUCAP 106*     Recent Results (from the past 240 hour(s))  Resp Panel by RT-PCR (Flu A&B, Covid) Nasopharyngeal Swab     Status: None   Collection Time: 12/24/20  4:59 PM   Specimen: Nasopharyngeal Swab; Nasopharyngeal(NP) swabs in vial transport medium  Result Value Ref Range Status  SARS Coronavirus 2 by RT PCR NEGATIVE NEGATIVE Final    Comment: (NOTE) SARS-CoV-2 target nucleic acids are NOT DETECTED.  The SARS-CoV-2 RNA is generally detectable in upper respiratory specimens during the acute phase of infection. The lowest concentration of SARS-CoV-2 viral copies this assay can detect is 138 copies/mL. A negative result does not preclude SARS-Cov-2 infection and should not be used as the sole basis for treatment or other patient management decisions. A negative result may occur with  improper specimen collection/handling, submission of specimen other than nasopharyngeal swab, presence of viral mutation(s) within the areas targeted by this assay, and inadequate number of viral copies(<138 copies/mL). A negative result must be combined with clinical observations, patient history, and epidemiological information. The expected result is Negative.  Fact Sheet for Patients:  EntrepreneurPulse.com.au  Fact Sheet for Healthcare Providers:  IncredibleEmployment.be  This test is no t yet approved or cleared by the [place] FDA and  has been authorized for detection and/or diagnosis of SARS-CoV-2 by FDA under an Emergency Use Authorization (EUA). This EUA will remain  in effect (meaning this test can be used) for the duration of the COVID-19 declaration under Section 564(b)(1) of the Act, 21 U.S.C.section 360bbb-3(b)(1), unless the authorization is terminated  or revoked sooner.       Influenza A by PCR NEGATIVE NEGATIVE Final   Influenza B by PCR NEGATIVE NEGATIVE Final    Comment: (NOTE) The Xpert Xpress  SARS-CoV-2/FLU/RSV plus assay is intended as an aid in the diagnosis of influenza from Nasopharyngeal swab specimens and should not be used as a sole basis for treatment. Nasal washings and aspirates are unacceptable for Xpert Xpress SARS-CoV-2/FLU/RSV testing.  Fact Sheet for Patients: EntrepreneurPulse.com.au  Fact Sheet for Healthcare Providers: IncredibleEmployment.be  This test is not yet approved or cleared by the [place] FDA and has been authorized for detection and/or diagnosis of SARS-CoV-2 by FDA under an Emergency Use Authorization (EUA). This EUA will remain in effect (meaning this test can be used) for the duration of the COVID-19 declaration under Section 564(b)(1) of the Act, 21 U.S.C. section 360bbb-3(b)(1), unless the authorization is terminated or revoked.  Performed at [facility], [address], [place], [place] [address]          Radiology Studies: DG Chest 2 View  Result Date: 12/24/2020 CLINICAL DATA:  Syncope. EXAM: CHEST - 2 VIEW COMPARISON:  None. FINDINGS: The heart size and mediastinal contours are within normal limits. Both lungs are clear. The visualized skeletal structures are unremarkable. IMPRESSION: No active cardiopulmonary disease. Electronically Signed   By: Marijo Conception M.D.   On: 12/24/2020 14:48   CT HEAD WO CONTRAST (5MM)  Result Date: 12/24/2020 CLINICAL DATA:  Altered mental status, history of seizures, remote aneurysm status post clipping EXAM: CT HEAD WITHOUT CONTRAST TECHNIQUE: Contiguous axial images were obtained from the base of the skull through the vertex without intravenous contrast. COMPARISON:  MRI brain dated 04/28/2019 FINDINGS: Brain: No evidence of acute infarction, hemorrhage, hydrocephalus, extra-axial collection or mass lesion/mass effect. Subcortical white matter and periventricular small vessel ischemic changes. Vascular: Postprocedural changes related to right ICA  aneurysm clipping. Skull: Normal. Negative for fracture or focal lesion. Right frontal craniotomy. Sinuses/Orbits: The visualized paranasal sinuses are essentially clear. The mastoid air cells are unopacified. Other: None. IMPRESSION: No evidence of acute intracranial abnormality. Postprocedural changes related to old right ICA aneurysm clipping. Small vessel ischemic changes. Electronically Signed   By: Julian Hy M.D.   On:  12/24/2020 21:58   MR ANGIO HEAD WO CONTRAST  Result Date: 12/25/2020 CLINICAL DATA:  Stroke EXAM: MRI HEAD WITHOUT CONTRAST MRA HEAD WITHOUT CONTRAST TECHNIQUE: Multiplanar, multi-echo pulse sequences of the brain and surrounding structures were acquired without intravenous contrast. Angiographic images of the Circle of Willis were acquired using MRA technique without intravenous contrast. COMPARISON:  04/28/2019 FINDINGS: MRI HEAD FINDINGS Brain: No acute infarct, mass effect or extra-axial collection. No acute or chronic hemorrhage. There is multifocal hyperintense T2-weighted signal within the white matter. Parenchymal volume and CSF spaces are normal. The midline structures are normal. Vascular: Major flow voids are preserved. Skull and upper cervical spine: Normal calvarium and skull base. Visualized upper cervical spine and soft tissues are normal. Sinuses/Orbits:No paranasal sinus fluid levels or advanced mucosal thickening. No mastoid or middle ear effusion. Normal orbits. MRA HEAD FINDINGS POSTERIOR CIRCULATION: --Vertebral arteries: Normal --Inferior cerebellar arteries: Normal. --Basilar artery: Normal. --Superior cerebellar arteries: Normal. --Posterior cerebral arteries: Normal. ANTERIOR CIRCULATION: --Intracranial internal carotid arteries: Right supraclinoid carotid aneurysm clip. Otherwise normal. --Anterior cerebral arteries (ACA): Normal. --Middle cerebral arteries (MCA): Normal. ANATOMIC VARIANTS: None IMPRESSION: 1. No acute intracranial abnormality. 2. Aneurysm  clip at the right supraclinoid ICA. Normal intracranial MRA. 3. Multifocal white matter disease, likely secondary to chronic small vessel ischemia. Electronically Signed   By: Ulyses Jarred M.D.   On: 12/25/2020 21:43   MR BRAIN WO CONTRAST  Result Date: 12/25/2020 CLINICAL DATA:  Stroke EXAM: MRI HEAD WITHOUT CONTRAST MRA HEAD WITHOUT CONTRAST TECHNIQUE: Multiplanar, multi-echo pulse sequences of the brain and surrounding structures were acquired without intravenous contrast. Angiographic images of the Circle of Willis were acquired using MRA technique without intravenous contrast. COMPARISON:  04/28/2019 FINDINGS: MRI HEAD FINDINGS Brain: No acute infarct, mass effect or extra-axial collection. No acute or chronic hemorrhage. There is multifocal hyperintense T2-weighted signal within the white matter. Parenchymal volume and CSF spaces are normal. The midline structures are normal. Vascular: Major flow voids are preserved. Skull and upper cervical spine: Normal calvarium and skull base. Visualized upper cervical spine and soft tissues are normal. Sinuses/Orbits:No paranasal sinus fluid levels or advanced mucosal thickening. No mastoid or middle ear effusion. Normal orbits. MRA HEAD FINDINGS POSTERIOR CIRCULATION: --Vertebral arteries: Normal --Inferior cerebellar arteries: Normal. --Basilar artery: Normal. --Superior cerebellar arteries: Normal. --Posterior cerebral arteries: Normal. ANTERIOR CIRCULATION: --Intracranial internal carotid arteries: Right supraclinoid carotid aneurysm clip. Otherwise normal. --Anterior cerebral arteries (ACA): Normal. --Middle cerebral arteries (MCA): Normal. ANATOMIC VARIANTS: None IMPRESSION: 1. No acute intracranial abnormality. 2. Aneurysm clip at the right supraclinoid ICA. Normal intracranial MRA. 3. Multifocal white matter disease, likely secondary to chronic small vessel ischemia. Electronically Signed   By: Ulyses Jarred M.D.   On: 12/25/2020 21:43        Scheduled  Meds:  atorvastatin  40 mg Oral QHS   benztropine  2 mg Oral Daily   busPIRone  10 mg Oral TID   diazepam  5 mg Oral QHS   gabapentin  300 mg Oral TID   hydrALAZINE  25 mg Oral BID   labetalol  100 mg Oral BID   lamoTRIgine  200 mg Oral Daily   levothyroxine  75 mcg Oral Q0600   linaclotide  72 mcg Oral QAC breakfast   LORazepam  1 mg Intravenous On Call   losartan  100 mg Oral Daily   lurasidone  120 mg Oral QHS   meloxicam  7.5 mg Oral BID   metoCLOPramide (REGLAN) injection  10 mg Intravenous Once  pantoprazole  40 mg Oral BID   phenytoin  400 mg Oral Daily   risperiDONE  0.5 mg Oral Daily   sodium chloride flush  3 mL Intravenous Q12H   valbenazine  80 mg Oral QHS   venlafaxine XR  150 mg Oral QHS   verapamil  120 mg Oral QHS   verapamil  120 mg Oral q morning   And   verapamil  180 mg Oral q morning   Continuous Infusions:  sodium chloride 75 mL/hr at 12/25/20 1728   magnesium sulfate bolus IVPB       LOS: 2 days   Time spent: 35 mins, case discussed with neurology Greater than 50% of this time was spent in counseling, explanation of diagnosis, planning of further management, and coordination of care.   Voice Recognition Viviann Spare dictation system was used to create this note, attempts have been made to correct errors. Please contact the author with questions and/or clarifications.   Florencia Reasons, MD PhD FACP [facility]  Available via Epic secure chat 7am-7pm for nonurgent issues Please page for urgent issues To page the attending provider between 7A-7P or the covering provider during after hours 7P-7A, please log into the web site [email or web address] and access using universal [place] password for that web site. If you do not have the password, please call the hospital operator.    12/26/2020, 12:02 PM

## 2020-12-26 NOTE — TOC Initial Note (Addendum)
Transition of Care [facility]) - Initial/Assessment Note    Patient Details  Name: Robin Jensen MRN: [id number] Date of Birth: May 18, 1960  Transition of Care [facility]) CM/SW Contact:    Harriet Masson, RN Phone Number: 12/26/2020, 3:50 PM  Clinical Narrative:                 There was a request for out-patient PT services from the pt instead of HHPT. RN spoke with pt who choose [facility]. Attending signed form and information has been faxed to the clinic for scheduling. Caregiver reports pt uses [facility] for [facility] needs and [facility] for ongoing transportation services. Pt also has a son that lives with pt.   TOC team will remain available to assist further for ongoing discharge needs.  Expected Discharge Plan: OP Rehab (out patient PT services.) Barriers to Discharge: Continued Medical Work up   Patient Goals and CMS Choice        Expected Discharge Plan and Services Expected Discharge Plan: OP Rehab (out patient PT services.)   Discharge Planning Services: CM Consult   Living arrangements for the past 2 months: Mobile Home                 DME Arranged: Environmental consultant (shower chair)                    Prior Living Arrangements/Services Living arrangements for the past 2 months: Mobile Home Lives with:: Significant Other, Adult Children Patient language and need for interpreter reviewed:: Yes Do you feel safe going back to the place where you live?: Yes      Need for Family Participation in Patient Care: Yes (Comment) Care giver support system in place?: Yes (comment)   Criminal Activity/Legal Involvement Pertinent to Current Situation/Hospitalization: No - Comment as needed  Activities of Daily Living Home Assistive Devices/Equipment: None ADL Screening (condition at time of admission) Patient's cognitive ability adequate to safely complete daily activities?: Yes Is the patient deaf or have difficulty hearing?: Yes (sometimes) Does the patient have difficulty  seeing, even when wearing glasses/contacts?: No Does the patient have difficulty concentrating, remembering, or making decisions?: Yes Patient able to express need for assistance with ADLs?: Yes Does the patient have difficulty dressing or bathing?: No Independently performs ADLs?: Yes (appropriate for developmental age) Does the patient have difficulty walking or climbing stairs?: No Weakness of Legs: None Weakness of Arms/Hands: None  Permission Sought/Granted Permission sought to share information with : Case Manager, Customer service manager, Family Supports Permission granted to share information with : Yes, Verbal Permission Granted     Permission granted to share info w AGENCY: [place] granted to share info w Relationship: [name]     Emotional Assessment Appearance:: Appears stated age Attitude/Demeanor/Rapport: Engaged Affect (typically observed): Accepting Orientation: : Oriented to Place, Oriented to  Time, Oriented to Self, Oriented to Situation Alcohol / Substance Use: Not Applicable Psych Involvement: No (comment)  Admission diagnosis:  Hypocalcemia [E83.51] Patient Active Problem List   Diagnosis Date Noted   Hypocalcemia 12/24/2020   Dizziness 11/20/2017   Ataxia 10/04/2017   Headache disorder 06/12/2017   Loss of memory 06/12/2017   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, without behavioral disturbance ([place]) 02/15/2016   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   Agoraphobia 01/28/2016   Borderline personality disorder ([place]) 01/28/2016   Falls 01/28/2016   Panic disorder 01/28/2016   Schizoaffective disorder, bipolar type ([place]) 01/28/2016   Spells of decreased attentiveness 01/28/2016  Cervical cancer ([place]) 05/06/2013   Aneurysm, cerebral, nonruptured 08/14/2012   Hypothyroidism 08/14/2012   Other and unspecified hyperlipidemia 08/14/2012   Seizures ([place]) 08/14/2012   Insomnia 08/14/2012   Unspecified constipation  08/07/2012   FHx: colon cancer 08/07/2012   Right sided abdominal pain 08/07/2012   Essential hypertension, benign 08/07/2012   Abnormal LFTs 08/07/2012   PCP:  Donnie Coffin, MD Pharmacy[facility]   [id number] [place] [address] [place]Phone: [phone]: [phone]    Social Determinants of Health (SDOH) Interventions    Readmission Risk Interventions No flowsheet data found.

## 2020-12-27 LAB — HEPATIC FUNCTION PANEL
ALT: 15 U/L (ref 0–44)
AST: 15 U/L (ref 15–41)
Albumin: 3 g/dL — ABNORMAL LOW (ref 3.5–5.0)
Alkaline Phosphatase: 91 U/L (ref 38–126)
Bilirubin, Direct: <0.1 mg/dL (ref 0.0–0.2)
Total Bilirubin: 0.3 mg/dL (ref 0.3–1.2)
Total Protein: 5.9 g/dL — ABNORMAL LOW (ref 6.5–8.1)

## 2020-12-27 LAB — PTH, INTACT AND CALCIUM
Calcium, Total (PTH): 9.5 mg/dL (ref 8.7–10.3)
PTH: 16 pg/mL (ref 15–65)

## 2020-12-27 LAB — LAMOTRIGINE LEVEL: Lamotrigine Lvl: 2.2 ug/mL (ref 2.0–20.0)

## 2020-12-27 MED ORDER — LABETALOL HCL 5 MG/ML IV SOLN
10.0000 mg | INTRAVENOUS | Status: AC | PRN
Start: 2020-12-27 — End: 2020-12-27
  Administered 2020-12-27 (×2): 10 mg via INTRAVENOUS
  Filled 2020-12-27: qty 4

## 2020-12-27 MED ORDER — HYDRALAZINE HCL 25 MG PO TABS: 25.0000 mg | ORAL_TABLET | Freq: Three times a day (TID) | ORAL | 0 refills | Status: DC

## 2020-12-27 MED ORDER — HYDRALAZINE HCL 50 MG PO TABS: 50.0000 mg | ORAL_TABLET | Freq: Three times a day (TID) | ORAL | Status: DC

## 2020-12-27 MED ORDER — LOSARTAN POTASSIUM 100 MG PO TABS: 100.0000 mg | ORAL_TABLET | Freq: Every day | ORAL | 0 refills | Status: DC

## 2020-12-27 MED ORDER — HYDRALAZINE HCL 50 MG PO TABS
50.0000 mg | ORAL_TABLET | Freq: Three times a day (TID) | ORAL | Status: DC
  Administered 2020-12-27: 09:00:00 50 mg via ORAL
  Filled 2020-12-27: qty 1

## 2020-12-27 MED ORDER — LABETALOL HCL 300 MG PO TABS: 300.0000 mg | ORAL_TABLET | Freq: Two times a day (BID) | ORAL | 0 refills | Status: DC

## 2020-12-27 MED ORDER — VERAPAMIL HCL ER 120 MG PO TBCR: 120.0000 mg | EXTENDED_RELEASE_TABLET | Freq: Every morning | ORAL | 0 refills | Status: DC

## 2020-12-27 NOTE — Discharge Summary (Signed)
Physician Discharge Summary  GLENNYS SHIRAH [id number] DOB: [birth year] DOA: 12/24/2020  PCP: Donnie Coffin, MD  Admit date: 12/24/2020 Discharge date: 12/27/2020  Admitted From: Home Disposition:  Home  Discharge Condition:Stable CODE STATUS:FULL Diet recommendation: Heart Healthy   Brief/Interim Summary:  60 year old female with H/o  affective disorder bipolar type, hypothyroidism from prior lithium therapy,  cerebral aneurysm status post clips, seizure disorder, memory issues on aricept hypertension, who presents with falls and an episode of syncope as well as new hypocalcemia, hypokalemia, and hypomagnesemia.  Electrolytes were supplemented and corrected.  PT recommended home health but she declined and wants to follow-up as an outpatient.  Hospital course remarkable for hypertension, she is already on multiple medications at home.  I have recommended her to follow-up with her PCP within a week, closely monitor her blood pressure at home.  She is medically stable for discharge.  Following problems were addressed during her hospitalization:   Syncope -Appears dehydrated on presentation, with orthostatic hypotension, started on  hydration now stopped -CT of the head no acute findings on presentation, MRI of the brain didn't show any acute intracranial abnormalities -Not able to obtain clear history, will benefit outpatient cardiac monitoring as patient report she had 2 episode this year -Tele here has been unremarkable    History of seizure disorder Report last seizure a while ago this year On seizure precaution Appear to be on Lamictal, phenytoin, Valium at home   Hypertension Labile blood pressure with extremely high blood pressure to low blood pressure On multiple medications at home Hold HCTZ due to dehydration and electrolyte abnormality Meds adjusted   Hypocalcemia PTH, ionized calcium, calcitriol normal vitamin D 60 Replaced/ improved    Hypomagnesemia/hypokalemia Replaced and improved Hold HCTZ   Falls/weakness/right knee pain She declined home health, agreed to outpatient physical therapy, referral made     The patient's BMI is: Body mass index is 35.66 kg/m.Marland Kitchen    Discharge Diagnoses:  Principal Problem:   Hypocalcemia Active Problems:   Essential hypertension, benign   Aneurysm, cerebral, nonruptured   Hypothyroidism   Seizures (HCC)   Borderline personality disorder (HCC)   Loss of memory   Schizoaffective disorder, bipolar type [facility])    Discharge Instructions  Discharge Instructions     Diet - low sodium heart healthy   Complete by: As directed    Discharge instructions   Complete by: As directed    1)Please follow up with her PCP in a week. 2)Please monitor your blood pressure very closely at home. 3)Follow up with ophthalmology as an outpatient to evaluate the intermittent blurry vision 4)Follow up with outpatient physical therapy. 5)You have high risk for falls, do not get abruptly from sitting or lying position.  Be careful when ambulating   Increase activity slowly   Complete by: As directed       Allergies as of 12/27/2020       Reactions   Bee Venom Anaphylaxis   Penicillins Shortness Of Breath, Rash   Latex    Lisinopril    Other    Sulfa Antibiotics         Medication List     STOP taking these medications    losartan-hydrochlorothiazide 100-25 MG tablet Commonly known as: HYZAAR   verapamil 100 MG 24 hr capsule Commonly known as: VERELAN Replaced by: verapamil 120 MG CR tablet       TAKE these medications    atorvastatin 40 MG tablet Commonly known as: LIPITOR Take 40  mg by mouth at bedtime.   benztropine 2 MG tablet Commonly known as: COGENTIN Take 2 mg by mouth daily.   busPIRone 10 MG tablet Commonly known as: BUSPAR Take 10 mg by mouth 3 (three) times daily.   cyclobenzaprine 10 MG tablet Commonly known as: FLEXERIL Take 10 mg by mouth every 8  (eight) hours as needed for muscle spasms.   desvenlafaxine 100 MG 24 hr tablet Commonly known as: PRISTIQ Take 150 mg by mouth at bedtime.   desvenlafaxine 50 MG 24 hr tablet Commonly known as: PRISTIQ Take 50 mg by mouth at bedtime.   diazepam 5 MG tablet Commonly known as: VALIUM Take 5 mg by mouth at bedtime.   donepezil 10 MG tablet Commonly known as: ARICEPT Take 10 mg by mouth at bedtime.   Emgality 120 MG/ML Sosy Generic drug: Galcanezumab-gnlm Inject 120 mg into the skin every 28 (twenty-eight) days.   EPINEPHrine 0.3 mg/0.3 mL Soaj injection Commonly known as: EPI-PEN Inject 0.3 mg into the muscle as needed. For anaphylactic reaction   gabapentin 300 MG capsule Commonly known as: NEURONTIN Take 300 mg by mouth at bedtime.   hydrALAZINE 25 MG tablet Commonly known as: APRESOLINE Take 1 tablet (25 mg total) by mouth 3 (three) times daily. What changed: when to take this   Ingrezza 80 MG capsule Generic drug: valbenazine Take 80 mg by mouth at bedtime.   Lorayne Bender Sustenna 234 MG/1.5ML Susy injection Generic drug: paliperidone Inject 234 mg into the muscle every 30 (thirty) days.   labetalol 300 MG tablet Commonly known as: NORMODYNE Take 1 tablet (300 mg total) by mouth 2 (two) times daily. What changed:  medication strength how much to take   lamoTRIgine 200 MG tablet Commonly known as: LAMICTAL Take 200 mg by mouth daily.   levothyroxine 75 MCG tablet Commonly known as: SYNTHROID Take 1 tablet (75 mcg total) by mouth daily before breakfast.   linaclotide 72 MCG capsule Commonly known as: LINZESS Take 72 mcg by mouth daily before breakfast.   losartan 100 MG tablet Commonly known as: COZAAR Take 1 tablet (100 mg total) by mouth daily.   meloxicam 7.5 MG tablet Commonly known as: MOBIC Take 7.5 mg by mouth daily.   pantoprazole 40 MG tablet Commonly known as: PROTONIX Take 1 tablet (40 mg total) by mouth 2 (two) times daily.   phenytoin  200 MG ER capsule Commonly known as: DILANTIN Take 400 mg by mouth daily.   risperiDONE 0.5 MG tablet Commonly known as: RISPERDAL Take 0.5 mg by mouth daily.   verapamil 120 MG CR tablet Commonly known as: CALAN-SR Take 1 tablet (120 mg total) by mouth every morning. Replaces: verapamil 100 MG 24 hr capsule        Follow-up Information     Aycock, Ngwe A, MD. Schedule an appointment as soon as possible for a visit in 1 week(s).   Specialty: Family Medicine Contact information: [place] [address] [phone]                Allergies  Allergen Reactions   Bee Venom Anaphylaxis   Penicillins Shortness Of Breath and Rash   Latex    Lisinopril    Other    Sulfa Antibiotics     Consultations:Neurology   Procedures/Studies: DG Chest 2 View  Result Date: 12/24/2020 CLINICAL DATA:  Syncope. EXAM: CHEST - 2 VIEW COMPARISON:  None. FINDINGS: The heart size and mediastinal contours are within normal limits. Both  lungs are clear. The visualized skeletal structures are unremarkable. IMPRESSION: No active cardiopulmonary disease. Electronically Signed   By: Marijo Conception M.D.   On: 12/24/2020 14:48   CT HEAD WO CONTRAST (5MM)  Result Date: 12/24/2020 CLINICAL DATA:  Altered mental status, history of seizures, remote aneurysm status post clipping EXAM: CT HEAD WITHOUT CONTRAST TECHNIQUE: Contiguous axial images were obtained from the base of the skull through the vertex without intravenous contrast. COMPARISON:  MRI brain dated 04/28/2019 FINDINGS: Brain: No evidence of acute infarction, hemorrhage, hydrocephalus, extra-axial collection or mass lesion/mass effect. Subcortical white matter and periventricular small vessel ischemic changes. Vascular: Postprocedural changes related to right ICA aneurysm clipping. Skull: Normal. Negative for fracture or focal lesion. Right frontal craniotomy. Sinuses/Orbits: The visualized paranasal sinuses are  essentially clear. The mastoid air cells are unopacified. Other: None. IMPRESSION: No evidence of acute intracranial abnormality. Postprocedural changes related to old right ICA aneurysm clipping. Small vessel ischemic changes. Electronically Signed   By: Julian Hy M.D.   On: 12/24/2020 21:58   MR ANGIO HEAD WO CONTRAST  Result Date: 12/25/2020 CLINICAL DATA:  Stroke EXAM: MRI HEAD WITHOUT CONTRAST MRA HEAD WITHOUT CONTRAST TECHNIQUE: Multiplanar, multi-echo pulse sequences of the brain and surrounding structures were acquired without intravenous contrast. Angiographic images of the Circle of Willis were acquired using MRA technique without intravenous contrast. COMPARISON:  04/28/2019 FINDINGS: MRI HEAD FINDINGS Brain: No acute infarct, mass effect or extra-axial collection. No acute or chronic hemorrhage. There is multifocal hyperintense T2-weighted signal within the white matter. Parenchymal volume and CSF spaces are normal. The midline structures are normal. Vascular: Major flow voids are preserved. Skull and upper cervical spine: Normal calvarium and skull base. Visualized upper cervical spine and soft tissues are normal. Sinuses/Orbits:No paranasal sinus fluid levels or advanced mucosal thickening. No mastoid or middle ear effusion. Normal orbits. MRA HEAD FINDINGS POSTERIOR CIRCULATION: --Vertebral arteries: Normal --Inferior cerebellar arteries: Normal. --Basilar artery: Normal. --Superior cerebellar arteries: Normal. --Posterior cerebral arteries: Normal. ANTERIOR CIRCULATION: --Intracranial internal carotid arteries: Right supraclinoid carotid aneurysm clip. Otherwise normal. --Anterior cerebral arteries (ACA): Normal. --Middle cerebral arteries (MCA): Normal. ANATOMIC VARIANTS: None IMPRESSION: 1. No acute intracranial abnormality. 2. Aneurysm clip at the right supraclinoid ICA. Normal intracranial MRA. 3. Multifocal white matter disease, likely secondary to chronic small vessel ischemia.  Electronically Signed   By: Ulyses Jarred M.D.   On: 12/25/2020 21:43   MR BRAIN WO CONTRAST  Result Date: 12/25/2020 CLINICAL DATA:  Stroke EXAM: MRI HEAD WITHOUT CONTRAST MRA HEAD WITHOUT CONTRAST TECHNIQUE: Multiplanar, multi-echo pulse sequences of the brain and surrounding structures were acquired without intravenous contrast. Angiographic images of the Circle of Willis were acquired using MRA technique without intravenous contrast. COMPARISON:  04/28/2019 FINDINGS: MRI HEAD FINDINGS Brain: No acute infarct, mass effect or extra-axial collection. No acute or chronic hemorrhage. There is multifocal hyperintense T2-weighted signal within the white matter. Parenchymal volume and CSF spaces are normal. The midline structures are normal. Vascular: Major flow voids are preserved. Skull and upper cervical spine: Normal calvarium and skull base. Visualized upper cervical spine and soft tissues are normal. Sinuses/Orbits:No paranasal sinus fluid levels or advanced mucosal thickening. No mastoid or middle ear effusion. Normal orbits. MRA HEAD FINDINGS POSTERIOR CIRCULATION: --Vertebral arteries: Normal --Inferior cerebellar arteries: Normal. --Basilar artery: Normal. --Superior cerebellar arteries: Normal. --Posterior cerebral arteries: Normal. ANTERIOR CIRCULATION: --Intracranial internal carotid arteries: Right supraclinoid carotid aneurysm clip. Otherwise normal. --Anterior cerebral arteries (ACA): Normal. --Middle cerebral arteries (MCA): Normal. ANATOMIC VARIANTS: None IMPRESSION:  1. No acute intracranial abnormality. 2. Aneurysm clip at the right supraclinoid ICA. Normal intracranial MRA. 3. Multifocal white matter disease, likely secondary to chronic small vessel ischemia. Electronically Signed   By: Ulyses Jarred M.D.   On: 12/25/2020 21:43      Subjective: Patient seen and examined the bedside this morning.  Hemodynamically stable for discharge.  I called her son on phone, phone does not work.  I also  called her friend listed on her chart, call not received  Discharge Exam: Vitals:   12/27/20 0638 12/27/20 0734  BP: (!) 185/111 (!) 159/99  Pulse: 79 74  Resp:  18  Temp:  97.8 F (36.6 C)  SpO2:     Vitals:   12/27/20 0140 12/27/20 0543 12/27/20 0638 12/27/20 0734  BP: (!) 163/122 (!) 198/96 (!) 185/111 (!) 159/99  Pulse: 84 77 79 74  Resp: '16 16  18  '$ Temp: 98.5 F (36.9 C) 99.3 F (37.4 C)  97.8 F (36.6 C)  TempSrc: Oral Oral    SpO2: 97% 95%    Weight:      Height:        General: Pt is alert, awake, not in acute distress, obese Cardiovascular: RRR, S1/S2 +, no rubs, no gallops Respiratory: CTA bilaterally, no wheezing, no rhonchi Abdominal: Soft, NT, ND, bowel sounds + Extremities: no edema, no cyanosis    The results of significant diagnostics from this hospitalization (including imaging, microbiology, ancillary and laboratory) are listed below for reference.     Microbiology: Recent Results (from the past 240 hour(s))  Resp Panel by RT-PCR (Flu A&B, Covid) Nasopharyngeal Swab     Status: None   Collection Time: 12/24/20  4:59 PM   Specimen: Nasopharyngeal Swab; Nasopharyngeal(NP) swabs in vial transport medium  Result Value Ref Range Status   SARS Coronavirus 2 by RT PCR NEGATIVE NEGATIVE Final    Comment: (NOTE) SARS-CoV-2 target nucleic acids are NOT DETECTED.  The SARS-CoV-2 RNA is generally detectable in upper respiratory specimens during the acute phase of infection. The lowest concentration of SARS-CoV-2 viral copies this assay can detect is 138 copies/mL. A negative result does not preclude SARS-Cov-2 infection and should not be used as the sole basis for treatment or other patient management decisions. A negative result may occur with  improper specimen collection/handling, submission of specimen other than nasopharyngeal swab, presence of viral mutation(s) within the areas targeted by this assay, and inadequate number of viral copies(<138  copies/mL). A negative result must be combined with clinical observations, patient history, and epidemiological information. The expected result is Negative.  Fact Sheet for Patients:  EntrepreneurPulse.com.au  Fact Sheet for Healthcare Providers:  IncredibleEmployment.be  This test is no t yet approved or cleared by the [place] FDA and  has been authorized for detection and/or diagnosis of SARS-CoV-2 by FDA under an Emergency Use Authorization (EUA). This EUA will remain  in effect (meaning this test can be used) for the duration of the COVID-19 declaration under Section 564(b)(1) of the Act, 21 U.S.C.section 360bbb-3(b)(1), unless the authorization is terminated  or revoked sooner.       Influenza A by PCR NEGATIVE NEGATIVE Final   Influenza B by PCR NEGATIVE NEGATIVE Final    Comment: (NOTE) The Xpert Xpress SARS-CoV-2/FLU/RSV plus assay is intended as an aid in the diagnosis of influenza from Nasopharyngeal swab specimens and should not be used as a sole basis for treatment. Nasal washings and aspirates are unacceptable for Xpert Xpress SARS-CoV-2/FLU/RSV testing.  Fact Sheet for Patients: EntrepreneurPulse.com.au  Fact Sheet for Healthcare Providers: IncredibleEmployment.be  This test is not yet approved or cleared by the [place] FDA and has been authorized for detection and/or diagnosis of SARS-CoV-2 by FDA under an Emergency Use Authorization (EUA). This EUA will remain in effect (meaning this test can be used) for the duration of the COVID-19 declaration under Section 564(b)(1) of the Act, 21 U.S.C. section 360bbb-3(b)(1), unless the authorization is terminated or revoked.  Performed at [facility], [address] [place], [place] [address]      Labs: BNP (last 3 results) No results for input(s): BNP in the last 8760 hours. Basic Metabolic Panel: Recent Labs  Lab  12/24/20 1357 12/24/20 2202 12/24/20 2220 12/25/20 0403 12/25/20 1013 12/25/20 1647 12/26/20 0449  NA 140 138  --  139 138 135 136  K 2.7* 3.6  --  3.1* 3.7 4.0 3.7  CL 118* 102  --  103 106 98 102  CO2 18* 28  --  '28 26 29 26  '$ GLUCOSE 70 138*  --  87 146* 83 120*  BUN 13 16  --  '13 13 12 14  '$ CREATININE 0.93 1.20*  --  1.03* 0.99 0.99 1.05*  CALCIUM 5.8* 9.2 9.5 9.5 9.2 8.8* 9.1  MG 1.6* 1.9  --   --   --   --  1.8  PHOS  --  3.1  --   --   --   --   --    Liver Function Tests: Recent Labs  Lab 12/27/20 0513  AST 15  ALT 15  ALKPHOS 91  BILITOT 0.3  PROT 5.9*  ALBUMIN 3.0*   No results for input(s): LIPASE, AMYLASE in the last 168 hours. No results for input(s): AMMONIA in the last 168 hours. CBC: Recent Labs  Lab 12/24/20 1357 12/25/20 0403  WBC 8.3 9.4  HGB 10.2* 11.2*  HCT 31.7* 32.2*  MCV 90.3 84.1  PLT 164 196   Cardiac Enzymes: No results for input(s): CKTOTAL, CKMB, CKMBINDEX, TROPONINI in the last 168 hours. BNP: Invalid input(s): POCBNP CBG: Recent Labs  Lab 12/25/20 1943  GLUCAP 106*   D-Dimer No results for input(s): DDIMER in the last 72 hours. Hgb A1c No results for input(s): HGBA1C in the last 72 hours. Lipid Profile No results for input(s): CHOL, HDL, LDLCALC, TRIG, CHOLHDL, LDLDIRECT in the last 72 hours. Thyroid function studies No results for input(s): TSH, T4TOTAL, T3FREE, THYROIDAB in the last 72 hours.  Invalid input(s): FREET3 Anemia work up No results for input(s): VITAMINB12, FOLATE, FERRITIN, TIBC, IRON, RETICCTPCT in the last 72 hours. Urinalysis    Component Value Date/Time   COLORURINE YELLOW (A) 10/05/2017 0430   APPEARANCEUR Cloudy (A) 01/31/2019 1003   LABSPEC 1.038 (H) 10/05/2017 0430   PHURINE 6.0 10/05/2017 0430   GLUCOSEU Negative 01/31/2019 1003   HGBUR SMALL (A) 10/05/2017 0430   BILIRUBINUR Negative 01/31/2019 1003   KETONESUR NEGATIVE 10/05/2017 0430   PROTEINUR Negative 01/31/2019 1003   PROTEINUR  NEGATIVE 10/05/2017 0430   UROBILINOGEN negative 09/27/2012 1120   UROBILINOGEN 0.2 07/14/2010 1552   NITRITE Positive (A) 01/31/2019 1003   NITRITE NEGATIVE 10/05/2017 0430   LEUKOCYTESUR 1+ (A) 01/31/2019 1003   Sepsis Labs Invalid input(s): PROCALCITONIN,  WBC,  LACTICIDVEN Microbiology Recent Results (from the past 240 hour(s))  Resp Panel by RT-PCR (Flu A&B, Covid) Nasopharyngeal Swab     Status: None   Collection Time: 12/24/20  4:59 PM  Specimen: Nasopharyngeal Swab; Nasopharyngeal(NP) swabs in vial transport medium  Result Value Ref Range Status   SARS Coronavirus 2 by RT PCR NEGATIVE NEGATIVE Final    Comment: (NOTE) SARS-CoV-2 target nucleic acids are NOT DETECTED.  The SARS-CoV-2 RNA is generally detectable in upper respiratory specimens during the acute phase of infection. The lowest concentration of SARS-CoV-2 viral copies this assay can detect is 138 copies/mL. A negative result does not preclude SARS-Cov-2 infection and should not be used as the sole basis for treatment or other patient management decisions. A negative result may occur with  improper specimen collection/handling, submission of specimen other than nasopharyngeal swab, presence of viral mutation(s) within the areas targeted by this assay, and inadequate number of viral copies(<138 copies/mL). A negative result must be combined with clinical observations, patient history, and epidemiological information. The expected result is Negative.  Fact Sheet for Patients:  EntrepreneurPulse.com.au  Fact Sheet for Healthcare Providers:  IncredibleEmployment.be  This test is no t yet approved or cleared by the [place] FDA and  has been authorized for detection and/or diagnosis of SARS-CoV-2 by FDA under an Emergency Use Authorization (EUA). This EUA will remain  in effect (meaning this test can be used) for the duration of the COVID-19 declaration under Section  564(b)(1) of the Act, 21 U.S.C.section 360bbb-3(b)(1), unless the authorization is terminated  or revoked sooner.       Influenza A by PCR NEGATIVE NEGATIVE Final   Influenza B by PCR NEGATIVE NEGATIVE Final    Comment: (NOTE) The Xpert Xpress SARS-CoV-2/FLU/RSV plus assay is intended as an aid in the diagnosis of influenza from Nasopharyngeal swab specimens and should not be used as a sole basis for treatment. Nasal washings and aspirates are unacceptable for Xpert Xpress SARS-CoV-2/FLU/RSV testing.  Fact Sheet for Patients: EntrepreneurPulse.com.au  Fact Sheet for Healthcare Providers: IncredibleEmployment.be  This test is not yet approved or cleared by the [place] FDA and has been authorized for detection and/or diagnosis of SARS-CoV-2 by FDA under an Emergency Use Authorization (EUA). This EUA will remain in effect (meaning this test can be used) for the duration of the COVID-19 declaration under Section 564(b)(1) of the Act, 21 U.S.C. section 360bbb-3(b)(1), unless the authorization is terminated or revoked.  Performed at [facility], [address] [place], [place] [address]     Please note: You were cared for by a hospitalist during your hospital stay. Once you are discharged, your primary care physician will handle any further medical issues. Please note that NO REFILLS for any discharge medications will be authorized once you are discharged, as it is imperative that you return to your primary care physician (or establish a relationship with a primary care physician if you do not have one) for your post hospital discharge needs so that they can reassess your need for medications and monitor your lab values.    Time coordinating discharge: 40 minutes  SIGNED:   Shelly Coss, MD  [facility] 12/27/2020, 8:48 AM Pager [phone]  If 7PM-7AM, please contact night-coverage [email or web address] Password [email or web address]

## 2020-12-27 NOTE — Progress Notes (Addendum)
REASSESSMENT:   12/27/20 0000  Assess: MEWS Score  Temp 98.6 F (37 C)  BP (!) 190/105  Pulse Rate 80  Resp 18  Level of Consciousness Alert  SpO2 96 %  O2 Device Room Air  Assess: MEWS Score  MEWS Temp 0  MEWS Systolic 0  MEWS Pulse 0  MEWS RR 0  MEWS LOC 0  MEWS Score 0  MEWS Score Color Green  Treat  Pain Scale 0-10  Pain Score 0  Patients Stated Pain Goal 0  Document  Patient Outcome Other (Comment) (BP decreased, notified provider per verbal parameters)  Progress note created (see row info) Yes  Assess: SIRS CRITERIA  SIRS Temperature  0  SIRS Pulse 0  SIRS Respirations  0  SIRS WBC 0  SIRS Score Sum  0    Verbal order from Dr. Myna Hidalgo to give '10mg'$  IV labetalol for SBP >180. Will continue to monitor.

## 2020-12-27 NOTE — Progress Notes (Signed)
Difficulty controlling BP overnight. PRN labetalol given x2 in addition to scheduled BP medications. Most recent VS after prn medication:   12/27/20 0638  Vitals  BP (!) 185/111  MAP (mmHg) 130  Pulse Rate 79    Pt denies headache/dizziness. Alert and oriented. Blurred vision has resolved again. Otherwise, no change in neuro assessment.

## 2020-12-27 NOTE — Progress Notes (Addendum)
Patient provided with discharge education and materials. Verbalized understanding. IV access and telemetry removed, no issues noted. Patient currently waiting for ride to arrive.

## 2020-12-28 LAB — PHENYTOIN LEVEL, FREE AND TOTAL
Phenytoin, Free: 0.8 ug/mL — ABNORMAL LOW (ref 1.0–2.0)
Phenytoin, Total: 9.1 ug/mL — ABNORMAL LOW (ref 10.0–20.0)

## 2020-12-28 LAB — CALCITRIOL (1,25 DI-OH VIT D): Vit D, 1,25-Dihydroxy: 40.7 pg/mL (ref 24.8–81.5)

## 2021-02-25 ENCOUNTER — Other Ambulatory Visit: Payer: Self-pay | Admitting: Physician Assistant

## 2021-02-25 DIAGNOSIS — R296 Repeated falls: Secondary | ICD-10-CM

## 2021-02-25 DIAGNOSIS — R413 Other amnesia: Secondary | ICD-10-CM

## 2021-02-25 DIAGNOSIS — H538 Other visual disturbances: Secondary | ICD-10-CM

## 2021-02-25 DIAGNOSIS — R519 Headache, unspecified: Secondary | ICD-10-CM

## 2021-02-25 DIAGNOSIS — R569 Unspecified convulsions: Secondary | ICD-10-CM

## 2021-02-25 DIAGNOSIS — R251 Tremor, unspecified: Secondary | ICD-10-CM

## 2021-03-08 ENCOUNTER — Ambulatory Visit: Admission: RE | Admit: 2021-03-08 | Payer: Medicare Other | Source: Ambulatory Visit

## 2021-08-27 ENCOUNTER — Inpatient Hospital Stay
Admission: EM | Admit: 2021-08-27 | Discharge: 2021-08-31 | DRG: 689 | Disposition: A | Discharge status: Home Health | Payer: Medicare Other | Attending: Hospitalist | Admitting: Hospitalist

## 2021-08-27 ENCOUNTER — Emergency Department: Payer: Medicare Other

## 2021-08-27 DIAGNOSIS — N179 Acute kidney failure, unspecified: Secondary | ICD-10-CM | POA: Diagnosis present

## 2021-08-27 DIAGNOSIS — Z87440 Personal history of urinary (tract) infections: Secondary | ICD-10-CM

## 2021-08-27 DIAGNOSIS — Z8051 Family history of malignant neoplasm of kidney: Secondary | ICD-10-CM

## 2021-08-27 DIAGNOSIS — Z91013 Allergy to seafood: Secondary | ICD-10-CM

## 2021-08-27 DIAGNOSIS — N3001 Acute cystitis with hematuria: Principal | ICD-10-CM | POA: Diagnosis present

## 2021-08-27 DIAGNOSIS — R4182 Altered mental status, unspecified: Secondary | ICD-10-CM | POA: Diagnosis not present

## 2021-08-27 DIAGNOSIS — E876 Hypokalemia: Secondary | ICD-10-CM | POA: Diagnosis not present

## 2021-08-27 DIAGNOSIS — G40909 Epilepsy, unspecified, not intractable, without status epilepticus: Secondary | ICD-10-CM

## 2021-08-27 DIAGNOSIS — N390 Urinary tract infection, site not specified: Secondary | ICD-10-CM | POA: Diagnosis not present

## 2021-08-27 DIAGNOSIS — K219 Gastro-esophageal reflux disease without esophagitis: Secondary | ICD-10-CM | POA: Diagnosis present

## 2021-08-27 DIAGNOSIS — F250 Schizoaffective disorder, bipolar type: Secondary | ICD-10-CM | POA: Diagnosis present

## 2021-08-27 DIAGNOSIS — Z79899 Other long term (current) drug therapy: Secondary | ICD-10-CM

## 2021-08-27 DIAGNOSIS — W19XXXA Unspecified fall, initial encounter: Secondary | ICD-10-CM | POA: Diagnosis present

## 2021-08-27 DIAGNOSIS — Z8261 Family history of arthritis: Secondary | ICD-10-CM

## 2021-08-27 DIAGNOSIS — F419 Anxiety disorder, unspecified: Secondary | ICD-10-CM | POA: Diagnosis present

## 2021-08-27 DIAGNOSIS — E785 Hyperlipidemia, unspecified: Secondary | ICD-10-CM | POA: Diagnosis present

## 2021-08-27 DIAGNOSIS — G9341 Metabolic encephalopathy: Secondary | ICD-10-CM | POA: Diagnosis present

## 2021-08-27 DIAGNOSIS — E669 Obesity, unspecified: Secondary | ICD-10-CM | POA: Diagnosis present

## 2021-08-27 DIAGNOSIS — Z91030 Bee allergy status: Secondary | ICD-10-CM

## 2021-08-27 DIAGNOSIS — Z803 Family history of malignant neoplasm of breast: Secondary | ICD-10-CM

## 2021-08-27 DIAGNOSIS — Z888 Allergy status to other drugs, medicaments and biological substances status: Secondary | ICD-10-CM

## 2021-08-27 DIAGNOSIS — Z8673 Personal history of transient ischemic attack (TIA), and cerebral infarction without residual deficits: Secondary | ICD-10-CM

## 2021-08-27 DIAGNOSIS — R29810 Facial weakness: Secondary | ICD-10-CM | POA: Diagnosis present

## 2021-08-27 DIAGNOSIS — R296 Repeated falls: Secondary | ICD-10-CM | POA: Diagnosis present

## 2021-08-27 DIAGNOSIS — Z833 Family history of diabetes mellitus: Secondary | ICD-10-CM

## 2021-08-27 DIAGNOSIS — E039 Hypothyroidism, unspecified: Secondary | ICD-10-CM | POA: Diagnosis present

## 2021-08-27 DIAGNOSIS — F17210 Nicotine dependence, cigarettes, uncomplicated: Secondary | ICD-10-CM | POA: Diagnosis present

## 2021-08-27 DIAGNOSIS — R519 Headache, unspecified: Secondary | ICD-10-CM

## 2021-08-27 DIAGNOSIS — R471 Dysarthria and anarthria: Secondary | ICD-10-CM | POA: Diagnosis present

## 2021-08-27 DIAGNOSIS — Z881 Allergy status to other antibiotic agents status: Secondary | ICD-10-CM

## 2021-08-27 DIAGNOSIS — Z79890 Hormone replacement therapy (postmenopausal): Secondary | ICD-10-CM

## 2021-08-27 DIAGNOSIS — Z90710 Acquired absence of both cervix and uterus: Secondary | ICD-10-CM

## 2021-08-27 DIAGNOSIS — Z6833 Body mass index (BMI) 33.0-33.9, adult: Secondary | ICD-10-CM

## 2021-08-27 DIAGNOSIS — I10 Essential (primary) hypertension: Secondary | ICD-10-CM | POA: Diagnosis present

## 2021-08-27 DIAGNOSIS — Z9049 Acquired absence of other specified parts of digestive tract: Secondary | ICD-10-CM

## 2021-08-27 DIAGNOSIS — E871 Hypo-osmolality and hyponatremia: Secondary | ICD-10-CM | POA: Diagnosis present

## 2021-08-27 DIAGNOSIS — Z86718 Personal history of other venous thrombosis and embolism: Secondary | ICD-10-CM

## 2021-08-27 DIAGNOSIS — I160 Hypertensive urgency: Secondary | ICD-10-CM | POA: Diagnosis present

## 2021-08-27 DIAGNOSIS — Z800 Family history of malignant neoplasm of digestive organs: Secondary | ICD-10-CM

## 2021-08-27 DIAGNOSIS — F32A Depression, unspecified: Secondary | ICD-10-CM

## 2021-08-27 DIAGNOSIS — Z8679 Personal history of other diseases of the circulatory system: Secondary | ICD-10-CM

## 2021-08-27 DIAGNOSIS — Z880 Allergy status to penicillin: Secondary | ICD-10-CM

## 2021-08-27 LAB — ETHANOL: Alcohol, Ethyl (B): <10 mg/dL (ref <10)

## 2021-08-27 LAB — CBC WITH DIFFERENTIAL/PLATELET
Abs Immature Granulocytes: 0.04 10*3/uL (ref 0.00–0.07)
Basophils Absolute: 0 10*3/uL (ref 0.0–0.1)
Basophils Relative: 0 %
Eosinophils Absolute: 0 10*3/uL (ref 0.0–0.5)
Eosinophils Relative: 0 %
HCT: 33.4 % — ABNORMAL LOW (ref 36.0–46.0)
Hemoglobin: 11 g/dL — ABNORMAL LOW (ref 12.0–15.0)
Immature Granulocytes: 0 %
Lymphocytes Relative: 14 %
Lymphs Abs: 1.7 10*3/uL (ref 0.7–4.0)
MCH: 27.6 pg (ref 26.0–34.0)
MCHC: 32.9 g/dL (ref 30.0–36.0)
MCV: 83.7 fL (ref 80.0–100.0)
Monocytes Absolute: 1.5 10*3/uL — ABNORMAL HIGH (ref 0.1–1.0)
Monocytes Relative: 13 %
Neutro Abs: 8.7 10*3/uL — ABNORMAL HIGH (ref 1.7–7.7)
Neutrophils Relative %: 73 %
Platelets: 147 10*3/uL — ABNORMAL LOW (ref 150–400)
RBC: 3.99 MIL/uL (ref 3.87–5.11)
RDW: 13.6 % (ref 11.5–15.5)
WBC: 12 10*3/uL — ABNORMAL HIGH (ref 4.0–10.5)
nRBC: 0 % (ref 0.0–0.2)

## 2021-08-27 LAB — CBG MONITORING, ED: Glucose-Capillary: 92 mg/dL (ref 70–99)

## 2021-08-27 LAB — URINALYSIS, ROUTINE W REFLEX MICROSCOPIC
Bilirubin Urine: NEGATIVE
Glucose, UA: NEGATIVE mg/dL
Ketones, ur: NEGATIVE mg/dL
Nitrite: NEGATIVE
Protein, ur: 30 mg/dL — AB
Specific Gravity, Urine: 1.008 (ref 1.005–1.030)
WBC, UA: >50 WBC/hpf — ABNORMAL HIGH (ref 0–5)
pH: 5 (ref 5.0–8.0)

## 2021-08-27 LAB — COMPREHENSIVE METABOLIC PANEL
ALT: 26 U/L (ref 0–44)
AST: 38 U/L (ref 15–41)
Albumin: 3.2 g/dL — ABNORMAL LOW (ref 3.5–5.0)
Alkaline Phosphatase: 81 U/L (ref 38–126)
Anion gap: 10 (ref 5–15)
BUN: 14 mg/dL (ref 8–23)
CO2: 25 mmol/L (ref 22–32)
Calcium: 8.7 mg/dL — ABNORMAL LOW (ref 8.9–10.3)
Chloride: 98 mmol/L (ref 98–111)
Creatinine, Ser: 1.35 mg/dL — ABNORMAL HIGH (ref 0.44–1.00)
GFR, Estimated: 45 mL/min — ABNORMAL LOW (ref >60)
Glucose, Bld: 90 mg/dL (ref 70–99)
Potassium: 3.3 mmol/L — ABNORMAL LOW (ref 3.5–5.1)
Sodium: 133 mmol/L — ABNORMAL LOW (ref 135–145)
Total Bilirubin: 0.8 mg/dL (ref 0.3–1.2)
Total Protein: 6.3 g/dL — ABNORMAL LOW (ref 6.5–8.1)

## 2021-08-27 LAB — URINE DRUG SCREEN, QUALITATIVE (ARMC ONLY)
Amphetamines, Ur Screen: NOT DETECTED
Barbiturates, Ur Screen: NOT DETECTED
Benzodiazepine, Ur Scrn: POSITIVE — AB
Cannabinoid 50 Ng, Ur ~~LOC~~: POSITIVE — AB
Cocaine Metabolite,Ur ~~LOC~~: NOT DETECTED
MDMA (Ecstasy)Ur Screen: NOT DETECTED
Methadone Scn, Ur: NOT DETECTED
Opiate, Ur Screen: NOT DETECTED
Phencyclidine (PCP) Ur S: NOT DETECTED
Tricyclic, Ur Screen: NOT DETECTED

## 2021-08-27 LAB — PROTIME-INR
INR: 1 (ref 0.8–1.2)
Prothrombin Time: 12.9 seconds (ref 11.4–15.2)

## 2021-08-27 LAB — MAGNESIUM: Magnesium: 2.1 mg/dL (ref 1.7–2.4)

## 2021-08-27 LAB — TSH: TSH: 2.791 u[IU]/mL (ref 0.350–4.500)

## 2021-08-27 MED ORDER — ATORVASTATIN CALCIUM 20 MG PO TABS
40.0000 mg | ORAL_TABLET | Freq: Every day | ORAL | Status: DC
  Administered 2021-08-27 – 2021-08-30 (×4): 40 mg via ORAL
  Filled 2021-08-27 (×4): qty 2

## 2021-08-27 MED ORDER — ASPIRIN 81 MG PO CHEW
CHEWABLE_TABLET | ORAL | Status: AC
  Administered 2021-08-27: 324 mg
  Filled 2021-08-27: qty 4

## 2021-08-27 MED ORDER — LAMOTRIGINE 100 MG PO TABS
200.0000 mg | ORAL_TABLET | Freq: Every day | ORAL | Status: DC
  Administered 2021-08-28 – 2021-08-31 (×4): 200 mg via ORAL
  Filled 2021-08-27 (×4): qty 2

## 2021-08-27 MED ORDER — POTASSIUM CHLORIDE 20 MEQ PO PACK
40.0000 meq | PACK | Freq: Once | ORAL | Status: AC
  Administered 2021-08-27: 40 meq via ORAL
  Filled 2021-08-27: qty 2

## 2021-08-27 MED ORDER — MAGNESIUM HYDROXIDE 400 MG/5ML PO SUSP: 30.0000 mL | Freq: Every day | ORAL | Status: DC | PRN

## 2021-08-27 MED ORDER — ONDANSETRON HCL 4 MG PO TABS: 4.0000 mg | ORAL_TABLET | Freq: Four times a day (QID) | ORAL | Status: DC | PRN

## 2021-08-27 MED ORDER — CYCLOBENZAPRINE HCL 10 MG PO TABS
10.0000 mg | ORAL_TABLET | Freq: Three times a day (TID) | ORAL | Status: DC | PRN
  Administered 2021-08-30: 10 mg via ORAL
  Filled 2021-08-27: qty 1

## 2021-08-27 MED ORDER — LORAZEPAM 2 MG/ML IJ SOLN: 1.0000 mg | INTRAMUSCULAR | Status: DC | PRN

## 2021-08-27 MED ORDER — VERAPAMIL HCL ER 120 MG PO TBCR
120.0000 mg | EXTENDED_RELEASE_TABLET | Freq: Every morning | ORAL | Status: DC
Start: 2021-08-28 — End: 2021-08-30
  Administered 2021-08-28 – 2021-08-29 (×2): 120 mg via ORAL
  Filled 2021-08-27 (×3): qty 1

## 2021-08-27 MED ORDER — ACETAMINOPHEN 325 MG PO TABS
650.0000 mg | ORAL_TABLET | Freq: Four times a day (QID) | ORAL | Status: DC | PRN
  Administered 2021-08-28 – 2021-08-30 (×2): 650 mg via ORAL
  Filled 2021-08-27 (×2): qty 2

## 2021-08-27 MED ORDER — ACETAMINOPHEN 650 MG RE SUPP: 650.0000 mg | Freq: Four times a day (QID) | RECTAL | Status: DC | PRN

## 2021-08-27 MED ORDER — PANTOPRAZOLE SODIUM 40 MG PO TBEC
40.0000 mg | DELAYED_RELEASE_TABLET | Freq: Two times a day (BID) | ORAL | Status: DC
  Administered 2021-08-27 – 2021-08-31 (×8): 40 mg via ORAL
  Filled 2021-08-27 (×8): qty 1

## 2021-08-27 MED ORDER — LINACLOTIDE 72 MCG PO CAPS
72.0000 ug | ORAL_CAPSULE | Freq: Every day | ORAL | Status: DC
  Administered 2021-08-28 – 2021-08-31 (×3): 72 ug via ORAL
  Filled 2021-08-27 (×4): qty 1

## 2021-08-27 MED ORDER — HYDRALAZINE HCL 50 MG PO TABS
25.0000 mg | ORAL_TABLET | Freq: Three times a day (TID) | ORAL | Status: DC
Start: 2021-08-27 — End: 2021-08-30
  Administered 2021-08-27 – 2021-08-29 (×7): 25 mg via ORAL
  Filled 2021-08-27 (×7): qty 1

## 2021-08-27 MED ORDER — ASPIRIN 325 MG PO TABS
325.0000 mg | ORAL_TABLET | Freq: Every day | ORAL | Status: DC
  Filled 2021-08-27: qty 1

## 2021-08-27 MED ORDER — ENOXAPARIN SODIUM 40 MG/0.4ML IJ SOSY
40.0000 mg | PREFILLED_SYRINGE | INTRAMUSCULAR | Status: DC
  Administered 2021-08-27 – 2021-08-30 (×4): 40 mg via SUBCUTANEOUS
  Filled 2021-08-27 (×4): qty 0.4

## 2021-08-27 MED ORDER — EPINEPHRINE 0.3 MG/0.3ML IJ SOAJ: 0.3000 mg | INTRAMUSCULAR | Status: DC | PRN

## 2021-08-27 MED ORDER — SODIUM CHLORIDE 0.9 % IV SOLN
1.0000 g | Freq: Once | INTRAVENOUS | Status: AC
  Administered 2021-08-27: 1 g via INTRAVENOUS
  Filled 2021-08-27: qty 10

## 2021-08-27 MED ORDER — VALBENAZINE TOSYLATE 40 MG PO CAPS
80.0000 mg | ORAL_CAPSULE | Freq: Every day | ORAL | Status: DC
  Administered 2021-08-27 – 2021-08-30 (×3): 80 mg via ORAL
  Filled 2021-08-27 (×5): qty 2

## 2021-08-27 MED ORDER — DONEPEZIL HCL 5 MG PO TABS
10.0000 mg | ORAL_TABLET | Freq: Every day | ORAL | Status: DC
  Administered 2021-08-27 – 2021-08-30 (×4): 10 mg via ORAL
  Filled 2021-08-27 (×4): qty 2

## 2021-08-27 MED ORDER — DIAZEPAM 5 MG PO TABS
5.0000 mg | ORAL_TABLET | Freq: Every day | ORAL | Status: DC
  Administered 2021-08-27 – 2021-08-30 (×4): 5 mg via ORAL
  Filled 2021-08-27 (×4): qty 1

## 2021-08-27 MED ORDER — BENZTROPINE MESYLATE 1 MG PO TABS
2.0000 mg | ORAL_TABLET | Freq: Every day | ORAL | Status: DC
  Administered 2021-08-28 – 2021-08-31 (×4): 2 mg via ORAL
  Filled 2021-08-27 (×4): qty 2

## 2021-08-27 MED ORDER — LEVOTHYROXINE SODIUM 50 MCG PO TABS
75.0000 ug | ORAL_TABLET | Freq: Every day | ORAL | Status: DC
  Administered 2021-08-28 – 2021-08-31 (×4): 75 ug via ORAL
  Filled 2021-08-27 (×4): qty 1

## 2021-08-27 MED ORDER — GABAPENTIN 300 MG PO CAPS
300.0000 mg | ORAL_CAPSULE | Freq: Every day | ORAL | Status: DC
  Administered 2021-08-27 – 2021-08-30 (×4): 300 mg via ORAL
  Filled 2021-08-27 (×5): qty 1

## 2021-08-27 MED ORDER — STROKE: EARLY STAGES OF RECOVERY BOOK: Freq: Once | Status: AC

## 2021-08-27 MED ORDER — RISPERIDONE 0.5 MG PO TABS
0.5000 mg | ORAL_TABLET | Freq: Every day | ORAL | Status: DC
  Administered 2021-08-28 – 2021-08-31 (×4): 0.5 mg via ORAL
  Filled 2021-08-27 (×5): qty 1

## 2021-08-27 MED ORDER — TRAZODONE HCL 50 MG PO TABS: 25.0000 mg | ORAL_TABLET | Freq: Every evening | ORAL | Status: DC | PRN

## 2021-08-27 MED ORDER — PHENYTOIN SODIUM EXTENDED 100 MG PO CAPS
400.0000 mg | ORAL_CAPSULE | Freq: Every day | ORAL | Status: DC
  Administered 2021-08-28 – 2021-08-31 (×4): 400 mg via ORAL
  Filled 2021-08-27 (×4): qty 4

## 2021-08-27 MED ORDER — VENLAFAXINE HCL ER 75 MG PO CP24
150.0000 mg | ORAL_CAPSULE | Freq: Every day | ORAL | Status: DC
Start: 2021-08-28 — End: 2021-08-31
  Administered 2021-08-28 – 2021-08-31 (×4): 150 mg via ORAL
  Filled 2021-08-27 (×4): qty 2

## 2021-08-27 MED ORDER — SODIUM CHLORIDE 0.9 % IV BOLUS
1000.0000 mL | Freq: Once | INTRAVENOUS | Status: AC
  Administered 2021-08-27: 1000 mL via INTRAVENOUS

## 2021-08-27 MED ORDER — LABETALOL HCL 200 MG PO TABS
300.0000 mg | ORAL_TABLET | Freq: Two times a day (BID) | ORAL | Status: DC
  Administered 2021-08-27 – 2021-08-31 (×8): 300 mg via ORAL
  Filled 2021-08-27 (×9): qty 1

## 2021-08-27 MED ORDER — ONDANSETRON HCL 4 MG/2ML IJ SOLN
4.0000 mg | Freq: Four times a day (QID) | INTRAMUSCULAR | Status: DC | PRN
Start: 2021-08-27 — End: 2021-08-31

## 2021-08-27 MED ORDER — LOSARTAN POTASSIUM 50 MG PO TABS
100.0000 mg | ORAL_TABLET | Freq: Every day | ORAL | Status: DC
  Administered 2021-08-28 – 2021-08-31 (×4): 100 mg via ORAL
  Filled 2021-08-27 (×4): qty 2

## 2021-08-27 MED ORDER — SODIUM CHLORIDE 0.9 % IV SOLN
1.0000 g | INTRAVENOUS | Status: AC
  Administered 2021-08-28 – 2021-08-30 (×3): 1 g via INTRAVENOUS
  Filled 2021-08-27 (×3): qty 10

## 2021-08-27 MED ORDER — BUSPIRONE HCL 10 MG PO TABS
10.0000 mg | ORAL_TABLET | Freq: Three times a day (TID) | ORAL | Status: DC
  Administered 2021-08-27 – 2021-08-31 (×11): 10 mg via ORAL
  Filled 2021-08-27 (×11): qty 1

## 2021-08-27 MED ORDER — POTASSIUM CHLORIDE IN NACL 20-0.9 MEQ/L-% IV SOLN
INTRAVENOUS | Status: DC
  Filled 2021-08-27 (×2): qty 1000

## 2021-08-27 NOTE — Assessment & Plan Note (Signed)
-   This is likely prerenal.  We will hydrate with IV normal saline and follow her BMP. ?- We will avoid nephrotoxins. ?

## 2021-08-27 NOTE — H&P (Signed)
?  ?  ?[place] ? ? ?PATIENT NAME: Robin Jensen   ? ?MR#:  [id number] ? ?DATE OF BIRTH:  06/18/1960 ? ?DATE OF ADMISSION:  08/27/2021 ? ?PRIMARY CARE PHYSICIAN: Donnie Coffin, MD  ? ?Patient is coming from: Home ? ?REQUESTING/REFERRING PHYSICIAN: Valora Piccolo MD ? ?CHIEF COMPLAINT:  ? ?Chief Complaint  ?Patient presents with  ? Facial Droop  ? ? ?HISTORY OF PRESENT ILLNESS:  ?Robin Jensen is a 61 y.o. female with medical history significant for bipolar 1 disorder, depression GERD, recurrent UTIs hypertension, anemia, hypothyroidism, migraine, seizure disorder, history of cerebral aneurysm repair, who presented to the emergency room with acute onset of intermittent altered mental status with associated altered left facial droop with lightheadedness.  She reportedly had a fall with brief loss of consciousness.  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  Facial droop and dysarthria lasted about 5 minutes and resolved by arrival EMS.  She had a reported episode where her body went stiff and she stared straight forward and could not speak or interact with EMS staff.  Her symptoms resolved within 30 seconds after which the patient did not remember this event.  She denies any current paresthesias or new focal muscle weakness.  She admits to urinary frequency and urgency without dysuria, oliguria, hematuria or flank pain.  No fever or chills.  No neck pain or stiffness.  No chest pain or palpitations.  No nausea or vomiting or abdominal pain.  No tinnitus or vertigo.  No cough or wheezing or dyspnea. ? ?ED Course: Upon presentation to the ER respiratory rate was 21 and vital signs were otherwise within normal.  CBC showed WBC of 12.3 with neutrophilia and anemia close to baseline.  Blood glucose was 90.  CMP revealed a sodium of 133 with a potassium of 3.3, creatinine 1.35 above previous levels and albumin 3.2 and total protein 6.3 better than previous levels.  UA came back  positive for UTI. ?EKG as reviewed by me : EKG showed normal sinus rhythm with a rate of 82 with T wave inversion anterolaterally. ?Imaging: Noncontrasted head CT scan revealed stable postprocedural changes with a metallic density right ICA aneurysm clip noted and right frontal craniotomy defect without evidence of acute intracranial abnormality. ? ?The patient was given a gram of IV Rocephin, 1 L bolus of IV normal saline and 325 mg p.o. aspirin.  She will be admitted to an observation medical telemetry bed for further evaluation and management. ?PAST MEDICAL HISTORY:  ? ?Past Medical History:  ?Diagnosis Date  ? Aneurysm ([place])   ? head aneurysm   ? Anxiety   ? Bipolar 1 disorder ([place])   ? Cancer [facility])   ? uterine  ? Depression   ? Dry mouth   ? DVT of leg (deep venous thrombosis) ([place])   ? history of  ? Easy bruising   ? Eye dryness   ? Fatigue   ? GERD (gastroesophageal reflux disease)   ? Hyperlipidemia   ? Hypertension   ? Hypothyroid   ? Migraine   ? Migraine   ? Seizure disorder ([place])   ? Seizures ([place])   ? last year last seizure-had brain aneurysm. ON meds  ? Stroke [facility]) 1997  ? No deficits  ? Suicidal ideation   ? attempted overdoses in past  ? ? ?PAST SURGICAL HISTORY:  ? ?Past Surgical History:  ?Procedure Laterality Date  ? ABDOMINAL HYSTERECTOMY    ? CEREBRAL ANEURYSM REPAIR  1997  ? CESAREAN SECTION    ? CHOLECYSTECTOMY    ? COLONOSCOPY WITH PROPOFOL N/A 08/20/2012  ? Procedure: COLONOSCOPY WITH PROPOFOL;  Surgeon: [name], MD;  Location: [facility];  Service: Endoscopy;  Laterality: N/A;  In [place]ecum at [place]; Total Withdrawal Time= 12 minutes  ? COLONOSCOPY WITH PROPOFOL N/A 09/28/2017  ? Procedure: COLONOSCOPY WITH PROPOFOL;  Surgeon: [name], MD;  Location: [facility] ENDOSCOPY;  Service: Endoscopy;  Laterality: N/A;  ? ESOPHAGOGASTRODUODENOSCOPY (EGD) WITH PROPOFOL N/A 11/20/2016  ? Procedure: ESOPHAGOGASTRODUODENOSCOPY (EGD) WITH PROPOFOL;  Surgeon: [name], MD;  Location[facility]  ENDOSCOPY;  Service: Endoscopy;  Laterality: N/A;  ? ESOPHAGOGASTRODUODENOSCOPY (EGD) WITH PROPOFOL N/A 09/28/2017  ? Procedure: ESOPHAGOGASTRODUODENOSCOPY (EGD) WITH PROPOFOL;  Surgeon: [name], MD;  Location: [facility] ENDOSCOPY;  Service: Endoscopy;  Laterality: N/A;  ? RADICAL HYSTERECTOMY    ? ? ?SOCIAL HISTORY:  ? ?Social History  ? ?Tobacco Use  ? Smoking status: Light Smoker  ?  Types: Cigarettes  ?  Last attempt to quit: 08/15/2005  ?  Years since quitting: 16.0  ? Smokeless tobacco: Never  ?Substance Use Topics  ? 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 ? ?FAMILY HISTORY:  ? ?Family History  ?Problem Relation Age of Onset  ? Colon cancer Father   ?     less than age 70  ? Colon cancer Paternal Uncle   ?     81s  ? Colon cancer Paternal Grandfather   ? Breast cancer Maternal Aunt   ? Diabetes Sister   ? Arthritis Brother   ? Hematuria Mother   ? Kidney cancer Maternal Grandmother   ? Kidney disease Maternal Grandmother   ? Prostate cancer Neg Hx   ? ? ?DRUG ALLERGIES:  ? ?Allergies  ?Allergen Reactions  ? Bee Venom Anaphylaxis  ? Penicillins Shortness Of Breath and Rash  ? Latex   ? Lisinopril   ? Other   ? Sulfa Antibiotics   ? ? ?REVIEW OF SYSTEMS:  ? ?ROS ?As per history of present illness. All pertinent systems were reviewed above. Constitutional, HEENT, cardiovascular, respiratory, GI, GU, musculoskeletal, neuro, psychiatric, endocrine, integumentary and hematologic systems were reviewed and are otherwise negative/unremarkable except for positive findings mentioned above in the HPI. ? ? ?MEDICATIONS AT HOME:  ? ?Prior to Admission medications   ?Medication Sig Start Date End Date Taking? Authorizing Provider  ?atorvastatin (LIPITOR) 40 MG tablet Take 40 mg by mouth at bedtime.    Yes [provider]  ?benztropine (COGENTIN) 2 MG tablet Take 2 mg by mouth daily. 10/22/20  Yes [provider]  ?busPIRone (BUSPAR) 10 MG tablet Take 10 mg by mouth 3 (three) times daily. 12/13/20   Yes [provider]  ?cyclobenzaprine (FLEXERIL) 10 MG tablet Take 10 mg by mouth every 8 (eight) hours as needed for muscle spasms.    Yes [provider]  ?desvenlafaxine (PRISTIQ) 100 MG 24 hr tablet Take 150 mg by mouth at bedtime.   Yes [provider]  ?diazepam (VALIUM) 5 MG tablet Take 5 mg by mouth at bedtime. 09/20/17  Yes [provider]  ?donepezil (ARICEPT) 10 MG tablet Take 10 mg by mouth at bedtime.   Yes [provider]  ?EPINEPHrine 0.3 mg/0.3 mL IJ SOAJ injection Inject 0.3 mg into the muscle as needed. For anaphylactic reaction   Yes [provider]  ?gabapentin (NEURONTIN) 300 MG capsule Take 300 mg by mouth at  bedtime.   Yes [provider]  ?hydrALAZINE (APRESOLINE) 25 MG tablet Take 1 tablet (25 mg total) by mouth 3 (three) times daily. 12/27/20  Yes Shelly Coss, MD  ?INGREZZA 80 MG capsule Take 80 mg by mouth at bedtime. 11/29/20  Yes [provider]  ?labetalol (NORMODYNE) 300 MG tablet Take 1 tablet (300 mg total) by mouth 2 (two) times daily. 12/27/20  Yes Shelly Coss, MD  ?lamoTRIgine (LAMICTAL) 200 MG tablet Take 200 mg by mouth daily.   Yes [provider]  ?levothyroxine (SYNTHROID, LEVOTHROID) 75 MCG tablet Take 1 tablet (75 mcg total) by mouth daily before breakfast. 10/03/12  Yes Oxford, Orson Ape, FNP  ?linaclotide (LINZESS) 72 MCG capsule Take 72 mcg by mouth daily before breakfast. 04/15/18  Yes [provider]  ?losartan (COZAAR) 100 MG tablet Take 1 tablet (100 mg total) by mouth daily. 12/27/20  Yes Shelly Coss, MD  ?pantoprazole (PROTONIX) 40 MG tablet Take 1 tablet (40 mg total) by mouth 2 (two) times daily. 10/06/17  Yes Salary, Avel Peace, MD  ?phenytoin (DILANTIN) 200 MG ER capsule Take 400 mg by mouth daily.    Yes [provider]  ?risperiDONE (RISPERDAL) 0.5 MG tablet Take 0.5 mg by mouth daily. 12/09/20  Yes [provider]  ?verapamil (CALAN-SR) 120 MG CR tablet  Take 1 tablet (120 mg total) by mouth every morning. 12/27/20  Yes Shelly Coss, MD  ?desvenlafaxine (PRISTIQ) 50 MG 24 hr tablet Take 50 mg by mouth at bedtime. 12/15/20   [provider]  ?[facility]

## 2021-08-27 NOTE — Assessment & Plan Note (Signed)
-   We will hydrate with IV normal saline and follow sodium level. ?

## 2021-08-27 NOTE — ED Notes (Signed)
Provider [place] notified via secure chat that pt has HA. Awaiting orders.  ?

## 2021-08-27 NOTE — Assessment & Plan Note (Signed)
-   We will continue Synthroid. - We will check TSH. 

## 2021-08-27 NOTE — Assessment & Plan Note (Signed)
-   We will continue her antihypertensives. 

## 2021-08-27 NOTE — ED Notes (Signed)
Pt back from CT

## 2021-08-27 NOTE — Assessment & Plan Note (Signed)
-   This could be contributing to her metabolic encephalopathy. ?- She will be placed on IV Rocephin and will follow her urine culture and sensitivity. ?

## 2021-08-27 NOTE — ED Notes (Signed)
Pt in with TIA-like symptoms; history of similar with previous stroke. Pt's resp reg/unlabored; skin dry; laying calmly on stretcher; pt has her glasses on; pt's voice initially slurred on arrival but clearer now.  ?

## 2021-08-27 NOTE — Assessment & Plan Note (Signed)
-  We will replace potassium and check magnesium level. 

## 2021-08-27 NOTE — ED Provider Notes (Signed)
? ?[facility] ?Provider Note ? ? Event Date/Time  ? First MD Initiated Contact with Patient 08/27/21 1644   ?  (approximate) ?History  ?Facial Droop ? ?HPI ?Robin Jensen is a 61 y.o. female with a stated past medical history of aneurysm status post clip, migraines, hypertension, and recurrent urinary tract infections who presents for intermittent facial droop, intermittent lightheadedness, and intermittent altered mental status.  EMS states that they were told by patient's family at home that she has been having episodes of staring spells that she does not remember as well as losing strength on the right side of her body.  Patient states that she does have residual weakness after aneurysm however she also noticed a facial droop and some dysarthria during these episodes that is new for her.  Patient states that the last episode similar to this was just prior to EMS arrival and lasted approximately 5 minutes.  EMS state that when they got to her house and returned to get her stood up to the stretcher, she had an episode where her body went stiff, she stared straight forward, and could not speak or interact with EMS staff.  Patient's symptoms resolved approximately 30 seconds after initiation and patient did not remember this event.  Patient currently denies any vision changes, tinnitus, difficulty speaking, facial droop, sore throat, chest pain, shortness of breath, abdominal pain, nausea/vomiting/diarrhea, or weakness/numbness/paresthesias in any extremity ?Physical Exam  ?Triage Vital Signs: ?ED Triage Vitals  ?Enc Vitals Group  ?   BP 08/27/21 1652 135/66  ?   Pulse Rate 08/27/21 1652 82  ?   Resp 08/27/21 1652 (!) 21  ?   Temp 08/27/21 1654 98.8 ?F (37.1 ?C)  ?   Temp Source 08/27/21 1654 Oral  ?   SpO2 08/27/21 1652 98 %  ?   Weight --   ?   Height --   ?   Head Circumference --   ?   Peak Flow --   ?   Pain Score 08/27/21 1647 2  ?   Pain Loc --   ?   Pain Edu? --   ?   Excl. in [place]?  --   ? ?Most recent vital signs: ?Vitals:  ? 08/27/21 1903 08/27/21 1904  ?BP: (!) 162/98   ?Pulse:  82  ?Resp:  14  ?Temp:    ?SpO2:  97%  ? ?General: Awake, oriented x4. ?CV:  Good peripheral perfusion.  ?Resp:  Normal effort.  ?Abd:  No distention.  ?Other:  Middle-aged obese Caucasian female laying in bed in no distress.  NIHSS is 0 ?ED Results / Procedures / Treatments  ?Labs ?(all labs ordered are listed, but only abnormal results are displayed) ?Labs Reviewed  ?COMPREHENSIVE METABOLIC PANEL - Abnormal; Notable for the following components:  ?    Result Value  ? Sodium 133 (*)   ? Potassium 3.3 (*)   ? Creatinine, Ser 1.35 (*)   ? Calcium 8.7 (*)   ? Total Protein 6.3 (*)   ? Albumin 3.2 (*)   ? GFR, Estimated 45 (*)   ? All other components within normal limits  ?URINALYSIS, ROUTINE W REFLEX MICROSCOPIC - Abnormal; Notable for the following components:  ? Color, Urine YELLOW (*)   ? APPearance CLOUDY (*)   ? Hgb urine dipstick MODERATE (*)   ? Protein, ur 30 (*)   ? Leukocytes,Ua LARGE (*)   ? WBC, UA >50 (*)   ? Bacteria, UA MANY (*)   ?  All other components within normal limits  ?URINE DRUG SCREEN, QUALITATIVE ([facility] ONLY) - Abnormal; Notable for the following components:  ? Cannabinoid 50 Ng, Ur [place] POSITIVE (*)   ? Benzodiazepine, Ur Scrn POSITIVE (*)   ? All other components within normal limits  ?CBC WITH DIFFERENTIAL/PLATELET - Abnormal; Notable for the following components:  ? WBC 12.0 (*)   ? Hemoglobin 11.0 (*)   ? HCT 33.4 (*)   ? Platelets 147 (*)   ? Neutro Abs 8.7 (*)   ? Monocytes Absolute 1.5 (*)   ? All other components within normal limits  ?ETHANOL  ?PROTIME-INR  ?CBG MONITORING, ED  ? ?EKG ?ED ECG REPORT ?I, Naaman Plummer, the attending physician, personally viewed and interpreted this ECG. ?Date: 08/27/2021 ?EKG Time: 1653 ?Rate: 82 ?Rhythm: normal sinus rhythm ?QRS Axis: normal ?Intervals: normal ?ST/T Wave abnormalities: normal ?Narrative Interpretation: no evidence of acute  ischemia ?RADIOLOGY ?ED MD interpretation: CT of the head without contrast interpreted by me shows no evidence of acute abnormalities including no intracerebral hemorrhage, obvious masses, or significant edema ? ?MRI of the brain without contrast interpreted by me and shows no acute MRI findings with chronic small vessel ischemic changes of the pons and cerebral hemispheric white matter ?-Agree with radiology assessment ?Official radiology report(s): ?CT HEAD WO CONTRAST ? ?Result Date: 08/27/2021 ?CLINICAL DATA:  Transient ischemic attack. EXAM: CT HEAD WITHOUT CONTRAST TECHNIQUE: Contiguous axial images were obtained from the base of the skull through the vertex without intravenous contrast. RADIATION DOSE REDUCTION: This exam was performed according to the departmental dose-optimization program which includes automated exposure control, adjustment of the mA and/or kV according to patient size and/or use of iterative reconstruction technique. COMPARISON:  December 24, 2020 FINDINGS: Brain: No evidence of acute infarction, hemorrhage, hydrocephalus, extra-axial collection or mass lesion/mass effect. Vascular: No hyperdense vessel or unexpected calcification. A metallic density right ICA aneurysm clip is noted. Skull: A right frontal craniotomy defect is seen. Sinuses/Orbits: No acute finding. Other: None. IMPRESSION: 1. Stable postprocedural changes, without evidence of an acute intracranial abnormality. Electronically Signed   By: Virgina Norfolk M.D.   On: 08/27/2021 17:52  ? ?MR BRAIN WO CONTRAST ? ?Result Date: 08/27/2021 ?CLINICAL DATA:  Transient ischemic attack. Facial droop. Right-sided weakness. Confusion. EXAM: MRI HEAD WITHOUT CONTRAST TECHNIQUE: Multiplanar, multiecho pulse sequences of the brain and surrounding structures were obtained without intravenous contrast. COMPARISON:  Head CT earlier same day.  MRI 12/25/2020. FINDINGS: Brain: Diffusion imaging does not show any acute or subacute infarction.  There are minimal chronic small-vessel ischemic changes of the pons. No focal cerebellar abnormality. Cerebral hemispheres show mild chronic small-vessel ischemic changes of the white matter. No cortical or large vessel territory infarction. There is artifact related to an aneurysm clip at the base of the brain on the right. No mass lesion, hemorrhage, hydrocephalus or extra-axial collection Vascular: Major vessels at the base of the brain show flow. Skull and upper cervical spine: Negative Sinuses/Orbits: Clear/normal Other: None IMPRESSION: No acute MRI finding. Chronic small-vessel ischemic changes of the pons and cerebral hemispheric white matter. Aneurysm clip at the base of the brain on the right. Electronically Signed   By: Nelson Chimes M.D.   On: 08/27/2021 18:33   ?PROCEDURES: ?Critical Care performed: No ?.1-3 Lead EKG Interpretation ?Performed by: Naaman Plummer, MD ?Authorized by: Naaman Plummer, MD  ? ?  Interpretation: normal   ?  ECG rate:  83 ?  ECG rate assessment: normal   ?  Rhythm: sinus rhythm   ?  Ectopy: none   ?  Conduction: normal   ?MEDICATIONS ORDERED IN ED: ?Medications  ?aspirin tablet 325 mg (325 mg Oral Not Given 08/27/21 1910)  ?cefTRIAXone (ROCEPHIN) 1 g in sodium chloride 0.9 % 100 mL IVPB (has no administration in time range)  ?sodium chloride 0.9 % bolus 1,000 mL (1,000 mLs Intravenous New Bag/Given 08/27/21 1705)  ?aspirin 81 MG chewable tablet (324 mg  Given 08/27/21 1909)  ? ?IMPRESSION / MDM / ASSESSMENT AND PLAN / ED COURSE  ?I reviewed the triage vital signs and the nursing notes. ?             ?               ?The patient is on the cardiac monitor to evaluate for evidence of arrhythmia and/or significant heart rate changes. ?Patient is a 61 year old female who presents with symptoms concerning for TIA ?PMH risk factors: Aneurysm, previous TIA ?Neurologic Deficits: Facial droop, worsening right-sided weakness, altered mental status ?Last known Well Time: 1235 ?NIH Stroke Score:  0 ?Given History and Exam I have lower suspicion for infectious etiology, neurologic changes secondary to toxicologic ingestion, seizure, complex migraine. ?Presentation concerning for possible stroke requiring wor

## 2021-08-27 NOTE — ED Notes (Signed)
Pt agrees to notify this RN when she can provide urine sample. Pt has call bell. Rail up. Stretcher locked low.  ?

## 2021-08-27 NOTE — ED Notes (Signed)
When asked about pain pt confirms has HA; states she gets monthly injections for migraines; reports 8/10 when compared to previous migraines if 10/10 is worst one she has ever had. Pt watching tv calmly.  ?

## 2021-08-27 NOTE — ED Notes (Signed)
EKG completed

## 2021-08-27 NOTE — ED Notes (Signed)
Assisted pt to bedside toilet and back to stretcher. Pt urinated. ?

## 2021-08-27 NOTE — Assessment & Plan Note (Addendum)
-   We will continue BuSpar, Valium and Pristiq. ?

## 2021-08-27 NOTE — ED Notes (Signed)
Blood-work sent to lab by [facility].  ?

## 2021-08-27 NOTE — Assessment & Plan Note (Addendum)
-   We will continue Ingrezza and Risperdal along with Cogentin. ?

## 2021-08-27 NOTE — ED Notes (Signed)
Pt assisted to bedside toilet. Pt providing urine sample. Pt denies dizziness but confirmed does feel a little off-balance.  ?

## 2021-08-27 NOTE — Assessment & Plan Note (Addendum)
-   Management as above. ?- We will continue Valium, phenytoin and Neurontin and she will be placed on as needed IV Ativan. ?

## 2021-08-27 NOTE — Assessment & Plan Note (Signed)
-   We will continue her statin therapy. 

## 2021-08-27 NOTE — ED Notes (Signed)
Pt leaving for imaging.

## 2021-08-27 NOTE — Assessment & Plan Note (Signed)
-   Differential diagnoses would include TIA given her dysarthria and left facial droop that were intermittent.  Her staring episode with unresponsiveness is concerning for seizure.  Metabolic encephalopathy is certainly in the differential diagnoses given her current UTI that could be contributing to her mental status change. ?-The patient will be admitted to an observation medical telemetry bed. ?- We will follow neurochecks every 4 hours for 24 hours. ?- Seizure precautions will be provided. ?- We will treat her UTI with IV Rocephin and follow urine culture and sensitivity. ?- She will be placed on aspirin and continued on statin therapy. ?- PT/OT and ST consults will be obtained. ?- EEG will be obtained. ?- Neurology consult will be obtained. ?- Dr. Rory Percy will be notified about the patient in a.m. via timed message.  ?

## 2021-08-27 NOTE — ED Triage Notes (Addendum)
Pt comes ems from home with episode of facial droop, right sided weakness, talking out of her head, and syncope. Lasted for about 20 mins then according to family was "back to her normal". Pt does appear to have a slight droop on her left side. Not on thinners. Pt reports feeling tired and foggy now. Cbg 104 with ems. Is on seizure medications.  ? ?EDP at bedside.  ?

## 2021-08-28 DIAGNOSIS — F250 Schizoaffective disorder, bipolar type: Secondary | ICD-10-CM | POA: Diagnosis present

## 2021-08-28 DIAGNOSIS — Z91030 Bee allergy status: Secondary | ICD-10-CM | POA: Diagnosis not present

## 2021-08-28 DIAGNOSIS — G928 Other toxic encephalopathy: Secondary | ICD-10-CM | POA: Diagnosis not present

## 2021-08-28 DIAGNOSIS — R4182 Altered mental status, unspecified: Secondary | ICD-10-CM | POA: Diagnosis present

## 2021-08-28 DIAGNOSIS — N179 Acute kidney failure, unspecified: Secondary | ICD-10-CM | POA: Diagnosis not present

## 2021-08-28 DIAGNOSIS — N390 Urinary tract infection, site not specified: Secondary | ICD-10-CM

## 2021-08-28 DIAGNOSIS — E785 Hyperlipidemia, unspecified: Secondary | ICD-10-CM | POA: Diagnosis present

## 2021-08-28 DIAGNOSIS — Z880 Allergy status to penicillin: Secondary | ICD-10-CM | POA: Diagnosis not present

## 2021-08-28 DIAGNOSIS — N3001 Acute cystitis with hematuria: Secondary | ICD-10-CM | POA: Diagnosis present

## 2021-08-28 DIAGNOSIS — Z6833 Body mass index (BMI) 33.0-33.9, adult: Secondary | ICD-10-CM | POA: Diagnosis not present

## 2021-08-28 DIAGNOSIS — K219 Gastro-esophageal reflux disease without esophagitis: Secondary | ICD-10-CM | POA: Diagnosis present

## 2021-08-28 DIAGNOSIS — I10 Essential (primary) hypertension: Secondary | ICD-10-CM | POA: Diagnosis present

## 2021-08-28 DIAGNOSIS — Z881 Allergy status to other antibiotic agents status: Secondary | ICD-10-CM | POA: Diagnosis not present

## 2021-08-28 DIAGNOSIS — Z87440 Personal history of urinary (tract) infections: Secondary | ICD-10-CM | POA: Diagnosis not present

## 2021-08-28 DIAGNOSIS — E876 Hypokalemia: Secondary | ICD-10-CM | POA: Diagnosis present

## 2021-08-28 DIAGNOSIS — R410 Disorientation, unspecified: Secondary | ICD-10-CM

## 2021-08-28 DIAGNOSIS — R471 Dysarthria and anarthria: Secondary | ICD-10-CM | POA: Diagnosis present

## 2021-08-28 DIAGNOSIS — E871 Hypo-osmolality and hyponatremia: Secondary | ICD-10-CM | POA: Diagnosis present

## 2021-08-28 DIAGNOSIS — Z8673 Personal history of transient ischemic attack (TIA), and cerebral infarction without residual deficits: Secondary | ICD-10-CM | POA: Diagnosis not present

## 2021-08-28 DIAGNOSIS — Z8679 Personal history of other diseases of the circulatory system: Secondary | ICD-10-CM | POA: Diagnosis not present

## 2021-08-28 DIAGNOSIS — R29810 Facial weakness: Secondary | ICD-10-CM | POA: Diagnosis present

## 2021-08-28 DIAGNOSIS — W19XXXA Unspecified fall, initial encounter: Secondary | ICD-10-CM | POA: Diagnosis present

## 2021-08-28 DIAGNOSIS — E039 Hypothyroidism, unspecified: Secondary | ICD-10-CM | POA: Diagnosis present

## 2021-08-28 DIAGNOSIS — E669 Obesity, unspecified: Secondary | ICD-10-CM | POA: Diagnosis present

## 2021-08-28 DIAGNOSIS — F419 Anxiety disorder, unspecified: Secondary | ICD-10-CM | POA: Diagnosis present

## 2021-08-28 DIAGNOSIS — G40909 Epilepsy, unspecified, not intractable, without status epilepticus: Secondary | ICD-10-CM | POA: Diagnosis present

## 2021-08-28 DIAGNOSIS — G9341 Metabolic encephalopathy: Secondary | ICD-10-CM | POA: Diagnosis present

## 2021-08-28 DIAGNOSIS — I160 Hypertensive urgency: Secondary | ICD-10-CM | POA: Diagnosis present

## 2021-08-28 DIAGNOSIS — Z91013 Allergy to seafood: Secondary | ICD-10-CM | POA: Diagnosis not present

## 2021-08-28 LAB — LIPID PANEL
Cholesterol: 124 mg/dL (ref 0–200)
HDL: 35 mg/dL — ABNORMAL LOW (ref >40)
LDL Cholesterol: 60 mg/dL (ref 0–99)
Total CHOL/HDL Ratio: 3.5 RATIO
Triglycerides: 146 mg/dL (ref <150)
VLDL: 29 mg/dL (ref 0–40)

## 2021-08-28 LAB — COMPREHENSIVE METABOLIC PANEL
ALT: 24 U/L (ref 0–44)
AST: 39 U/L (ref 15–41)
Albumin: 3 g/dL — ABNORMAL LOW (ref 3.5–5.0)
Alkaline Phosphatase: 78 U/L (ref 38–126)
Anion gap: 8 (ref 5–15)
BUN: 10 mg/dL (ref 8–23)
CO2: 22 mmol/L (ref 22–32)
Calcium: 8.3 mg/dL — ABNORMAL LOW (ref 8.9–10.3)
Chloride: 104 mmol/L (ref 98–111)
Creatinine, Ser: 1.13 mg/dL — ABNORMAL HIGH (ref 0.44–1.00)
GFR, Estimated: 55 mL/min — ABNORMAL LOW (ref >60)
Glucose, Bld: 99 mg/dL (ref 70–99)
Potassium: 3.8 mmol/L (ref 3.5–5.1)
Sodium: 134 mmol/L — ABNORMAL LOW (ref 135–145)
Total Bilirubin: 0.6 mg/dL (ref 0.3–1.2)
Total Protein: 6.2 g/dL — ABNORMAL LOW (ref 6.5–8.1)

## 2021-08-28 LAB — CBC
HCT: 33.5 % — ABNORMAL LOW (ref 36.0–46.0)
Hemoglobin: 10.8 g/dL — ABNORMAL LOW (ref 12.0–15.0)
MCH: 27.3 pg (ref 26.0–34.0)
MCHC: 32.2 g/dL (ref 30.0–36.0)
MCV: 84.6 fL (ref 80.0–100.0)
Platelets: 130 10*3/uL — ABNORMAL LOW (ref 150–400)
RBC: 3.96 MIL/uL (ref 3.87–5.11)
RDW: 13.6 % (ref 11.5–15.5)
WBC: 9.1 10*3/uL (ref 4.0–10.5)
nRBC: 0 % (ref 0.0–0.2)

## 2021-08-28 LAB — PHENYTOIN LEVEL, TOTAL: Phenytoin Lvl: 12.9 ug/mL (ref 10.0–20.0)

## 2021-08-28 LAB — HEMOGLOBIN A1C
Hgb A1c MFr Bld: 5.5 % (ref 4.8–5.6)
Mean Plasma Glucose: 111.15 mg/dL

## 2021-08-28 LAB — HIV ANTIBODY (ROUTINE TESTING W REFLEX): HIV Screen 4th Generation wRfx: NONREACTIVE

## 2021-08-28 MED ORDER — ASPIRIN EC 81 MG PO TBEC
81.0000 mg | DELAYED_RELEASE_TABLET | Freq: Every day | ORAL | Status: DC
  Administered 2021-08-28 – 2021-08-31 (×4): 81 mg via ORAL
  Filled 2021-08-28 (×4): qty 1

## 2021-08-28 NOTE — Progress Notes (Signed)
?PROGRESS NOTE ? ? ? ?Robin Jensen  TKZ:[id number] DOB: Sep 01, 1960 DOA: 08/27/2021 ?PCP: Donnie Coffin, MD  ? ? ?Brief Narrative:  ?61 y.o. female with medical history significant for bipolar 1 disorder, depression GERD, recurrent UTIs hypertension, anemia, hypothyroidism, migraine, seizure disorder, history of cerebral aneurysm repair, who presented to the emergency room with acute onset of intermittent altered mental status with associated altered left facial droop with lightheadedness.  She reportedly had a fall with brief loss of consciousness.  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  Facial droop and dysarthria lasted about 5 minutes and resolved by arrival EMS.  She had a reported episode where her body went stiff and she stared straight forward and could not speak or interact with EMS staff.  Her symptoms resolved within 30 seconds after which the patient did not remember this event.  She denies any current paresthesias or new focal muscle weakness.  She admits to urinary frequency and urgency without dysuria, oliguria, hematuria or flank pain.  No fever or chills.  No neck pain or stiffness.  No chest pain or palpitations.  No nausea or vomiting or abdominal pain.  No tinnitus or vertigo.  No cough or wheezing or dyspnea. ? ?MRI negative.  Discussed with neurology.  Plan for routine EEG 5/8.  Patient had low-grade temperature Tmax 100.4 today.  Remains on IV antibiotics for UTI. ? ? ?Assessment & Plan: ?  ?Principal Problem: ?  Altered mental status ?Active Problems: ?  Seizure disorder ([place]) ?  Acute lower urinary tract infection ?  Hypokalemia ?  AKI (acute kidney injury) ([place]) ?  Essential hypertension ?  Hyponatremia ?  Hypothyroidism ?  Schizoaffective disorder, bipolar type ([place]) ?  Anxiety and depression ?  Dyslipidemia ? ?Altered mentation ?Acute metabolic encephalopathy ?TIA remains on differential however MRI negative.  Suspecting metabolic  encephalopathy secondary to UTI ?Plan: ?Treatment for UTI with IV Rocephin ?PT OT ST consults ?Spot EEG to be completed 5/8 ?Case discussed with neurology Dr. Malen Gauze ? ?Seizure disorder ?Continue home regimen of Valium, phenytoin, Neurontin ?IV Ativan ?Check phenytoin level ? ?Acute cystitis ?Likely contributing to metabolic encephalopathy ?Treatment with IV Rocephin ?Monitor vitals and fever curve ?Monitor urine culture ? ?Hypokalemia ?Monitor and replace potassium as necessary ? ?Acute kidney injury ?Suspect prerenal azotemia ?IV hydration ?Avoid nephrotoxins ? ?Hyponatremia ?Suspect intravascular volume depletion ?IV hydration ? ?Essential hypertension ?Patient with refractory hypertension on multiple agents ?Continue home agents with holding parameters ? ?Hypothyroidism ?PTA Synthroid ? ?Schizoaffective disorder ?Bipolar 1 disorder ?PTA Ingrezza, Risperdal, Cogentin ? ?Hyperlipidemia ?PTA statin ? ?Anxiety ?Depression ?PTA BuSpar, Valium, Pristiq ? ? ? ?DVT prophylaxis: SQ Lovenox ?Code Status: Full ?Family Communication: None today ?Disposition Plan: Status is: Observation ?The patient will require care spanning > 2 midnights and should be moved to inpatient because: Acute UTI with associated metabolic encephalopathy on IV antibiotics.  Seizure disorder.  Concern for underlying breakthrough seizures.  Plan for routine EEG inpatient 5/8. ? ? ? ? ?Level of care: Telemetry Medical ? ?Consultants:  ?Neurology ? ?Procedures:  ?None ? ?Antimicrobials: ?Ceftriaxone ? ? ?Subjective: ?Seen and examined.  Resting comfortably in bed.  No visible distress.  Resting tremor noted. ? ?Objective: ?Vitals:  ? 08/27/21 2004 08/27/21 2044 08/28/21 0439 08/28/21 0803  ?BP: (!) 175/98 (!) 173/117 (!) 175/98 (!) 185/93  ?Pulse: 80 79 100 (!) 101  ?Resp: '16 18 18 18  '$ ?Temp:  98 ?F (36.7 ?C) (!) 100.4 ?F (38 ?C) 99.1 ?F (  37.3 ?C)  ?TempSrc:  Oral  Oral  ?SpO2: 99% 100% 96% 94%  ?Weight: 83.5 kg 81.6 kg    ?Height: '5\' 1"'$  (1.549 m) 5'  1" (1.549 m)    ? ?No intake or output data in the 24 hours ending 08/28/21 0906 ?Filed Weights  ? 08/27/21 2004 08/27/21 2044  ?Weight: 83.5 kg 81.6 kg  ? ? ?Examination: ? ?General exam: No acute distress.  Flattened affect.  Mild resting tremor ?Respiratory system: Lungs clear.  Normal work of breathing.  Room air ?Cardiovascular system: S1-S2, RRR, no murmurs, no pedal edema ?Gastrointestinal system: Soft, NT/ND, normal bowel sounds ?Central nervous system: Alert and oriented. No focal neurological deficits.  Resting tremor noted ?Extremities: Symmetric 5 x 5 power. ?Skin: No rashes, lesions or ulcers ?Psychiatry: Judgement and insight appear normal. Mood & affect flattened.  ? ? ? ?Data Reviewed: I have personally reviewed following labs and imaging studies ? ?CBC: ?Recent Labs  ?Lab 08/27/21 ?1649 08/28/21 ?7829  ?WBC 12.0* 9.1  ?NEUTROABS 8.7*  --   ?HGB 11.0* 10.8*  ?HCT 33.4* 33.5*  ?MCV 83.7 84.6  ?PLT 147* 130*  ? ?Basic Metabolic Panel: ?Recent Labs  ?Lab 08/27/21 ?1649 08/28/21 ?5621  ?NA 133* 134*  ?K 3.3* 3.8  ?CL 98 104  ?CO2 25 22  ?GLUCOSE 90 99  ?BUN 14 10  ?CREATININE 1.35* 1.13*  ?CALCIUM 8.7* 8.3*  ?MG 2.1  --   ? ?GFR: ?Estimated Creatinine Clearance: 50.6 mL/min (A) (by C-G formula based on SCr of 1.13 mg/dL (H)). ?Liver Function Tests: ?Recent Labs  ?Lab 08/27/21 ?1649 08/28/21 ?3086  ?AST 38 39  ?ALT 26 24  ?ALKPHOS 81 78  ?BILITOT 0.8 0.6  ?PROT 6.3* 6.2*  ?ALBUMIN 3.2* 3.0*  ? ?No results for input(s): LIPASE, AMYLASE in the last 168 hours. ?No results for input(s): AMMONIA in the last 168 hours. ?Coagulation Profile: ?Recent Labs  ?Lab 08/27/21 ?1649  ?INR 1.0  ? ?Cardiac Enzymes: ?No results for input(s): CKTOTAL, CKMB, CKMBINDEX, TROPONINI in the last 168 hours. ?BNP (last 3 results) ?No results for input(s): PROBNP in the last 8760 hours. ?HbA1C: ?Recent Labs  ?  08/27/21 ?1649  ?HGBA1C 5.5  ? ?CBG: ?Recent Labs  ?Lab 08/27/21 ?1647  ?GLUCAP 92  ? ?Lipid Profile: ?Recent Labs  ?   08/28/21 ?5784  ?CHOL 124  ?HDL 35*  ?[place] 60  ?TRIG 146  ?CHOLHDL 3.5  ? ?Thyroid Function Tests: ?Recent Labs  ?  08/27/21 ?2235  ?TSH 2.791  ? ?Anemia Panel: ?No results for input(s): VITAMINB12, FOLATE, FERRITIN, TIBC, IRON, RETICCTPCT in the last 72 hours. ?Sepsis Labs: ?No results for input(s): PROCALCITON, LATICACIDVEN in the last 168 hours. ? ?No results found for this or any previous visit (from the past 240 hour(s)).  ? ? ? ? ? ?Radiology Studies: ?CT HEAD WO CONTRAST ? ?Result Date: 08/27/2021 ?CLINICAL DATA:  Transient ischemic attack. EXAM: CT HEAD WITHOUT CONTRAST TECHNIQUE: Contiguous axial images were obtained from the base of the skull through the vertex without intravenous contrast. RADIATION DOSE REDUCTION: This exam was performed according to the departmental dose-optimization program which includes automated exposure control, adjustment of the mA and/or kV according to patient size and/or use of iterative reconstruction technique. COMPARISON:  December 24, 2020 FINDINGS: Brain: No evidence of acute infarction, hemorrhage, hydrocephalus, extra-axial collection or mass lesion/mass effect. Vascular: No hyperdense vessel or unexpected calcification. A metallic density right ICA aneurysm clip is noted. Skull: A right frontal craniotomy defect is seen.  Sinuses/Orbits: No acute finding. Other: None. IMPRESSION: 1. Stable postprocedural changes, without evidence of an acute intracranial abnormality. Electronically Signed   By: Virgina Norfolk M.D.   On: 08/27/2021 17:52  ? ?MR BRAIN WO CONTRAST ? ?Result Date: 08/27/2021 ?CLINICAL DATA:  Transient ischemic attack. Facial droop. Right-sided weakness. Confusion. EXAM: MRI HEAD WITHOUT CONTRAST TECHNIQUE: Multiplanar, multiecho pulse sequences of the brain and surrounding structures were obtained without intravenous contrast. COMPARISON:  Head CT earlier same day.  MRI 12/25/2020. FINDINGS: Brain: Diffusion imaging does not show any acute or subacute  infarction. There are minimal chronic small-vessel ischemic changes of the pons. No focal cerebellar abnormality. Cerebral hemispheres show mild chronic small-vessel ischemic changes of the white matter. No cortical or lar

## 2021-08-28 NOTE — Evaluation (Signed)
Occupational Therapy Evaluation Patient Details Name: Robin Jensen MRN: [id number] DOB: 07/03/1960 Today's Date: 08/28/2021   History of Present Illness Patient is a 61 year old female with a PMH (+) for bipolar 1 disorder, depression GERD, recurrent UTIs hypertension, anemia, hypothyroidism, migraine, seizure disorder, history of cerebral aneurysm repair who presented to the ED on 08/27/21 with reports of AMS and facial droop.   Clinical Impression   Robin Jensen was seen for OT evaluation this date. Pt noted with some cognitive deficits and decreased safety awareness t/o session, however, no family/caregiver available to determine functional baseline or confirm home set-up info. Pt reports that prior to her admission she was totally independent, but later describes ways in which her "boyfriend" assists her with LB dressing and IADL management. Pt reports that she lives with her boyfriend in a 1 level home with a walk in shower. Currently pt demonstrates impairments as described below (See OT problem list) which functionally limit her ability to perform ADL/self-care tasks. Pt currently requires MOD A for toileting/peri-care, SUPERVISION-MOD for functional mobility 2/2 posterior LOB and decreased safety awareness.  Pt would benefit from skilled OT services to address noted impairments and functional limitations (see below for any additional details) in order to maximize safety and independence while minimizing falls risk and caregiver burden. Upon hospital discharge, recommend STR to maximize pt safety and return to PLOF.        Recommendations for follow up therapy are one component of a multi-disciplinary discharge planning process, led by the attending physician.  Recommendations may be updated based on patient status, additional functional criteria and insurance authorization.   Follow Up Recommendations  Skilled nursing-short term rehab (<3 hours/day)    Assistance Recommended at  Discharge Frequent or constant Supervision/Assistance  Patient can return home with the following A little help with walking and/or transfers;A little help with bathing/dressing/bathroom;Assistance with cooking/housework;Help with stairs or ramp for entrance;Direct supervision/assist for financial management;Direct supervision/assist for medications management;Assist for transportation    Functional Status Assessment  Patient has had a recent decline in their functional status and demonstrates the ability to make significant improvements in function in a reasonable and predictable amount of time.  Equipment Recommendations  Other (comment) (Defer to next venue of care.)    Recommendations for Other Services       Precautions / Restrictions Precautions Precautions: Fall Restrictions Weight Bearing Restrictions: No      Mobility Bed Mobility Overal bed mobility: Needs Assistance Bed Mobility: Supine to Sit, Sit to Supine     Supine to sit: Min assist Sit to supine: Supervision, HOB elevated   General bed mobility comments: MIN A for trunk elevation and to advance hips toward EOB.    Transfers Overall transfer level: Needs assistance Equipment used: 1 person hand held assist, Rolling walker (2 wheels) Transfers: Sit to/from Stand, Bed to chair/wheelchair/BSC Sit to Stand: Supervision     Step pivot transfers: Mod assist     General transfer comment: Requires MOD A after posterior LOB to safely come to sitting position on BSC.      Balance Overall balance assessment: Needs assistance Sitting-balance support: Feet supported, Single extremity supported Sitting balance-Leahy Scale: Good     Standing balance support: Bilateral upper extremity supported, Single extremity supported, During functional activity, Reliant on assistive device for balance Standing balance-Leahy Scale: Poor Standing balance comment: x1 instsance of posterior LOB. Safety with standing balance  improves with use of RW and BUE support.  ADL either performed or assessed with clinical judgement   ADL Overall ADL's : Needs assistance/impaired                                       General ADL Comments: x1 instance of posterior LOB during toilet transfer in which pt requires MIN-MOD A to maintain upright positioning and safely recover balance. Pt requires MOD A for peri-care after toileting for thoroughness. Pt with decreased awareness of need for assistance after having BM on BSC. She is noted with general decreased safety awareness and decreased short term memory appreciated t/o session. No family present to determine baseline.     Vision Patient Visual Report: No change from baseline       Perception     Praxis      Pertinent Vitals/Pain Pain Assessment Pain Assessment: No/denies pain     Hand Dominance Right   Extremity/Trunk Assessment Upper Extremity Assessment Upper Extremity Assessment: RUE deficits/detail RUE Deficits / Details: hx of R sided weakness. Grossly 3+ t/o. RUE Coordination: decreased fine motor LUE Deficits / Details: Overall WFL   Lower Extremity Assessment Lower Extremity Assessment: Generalized weakness;Defer to PT evaluation       Communication Communication Communication: No difficulties   Cognition Arousal/Alertness: Awake/alert Behavior During Therapy: WFL for tasks assessed/performed Overall Cognitive Status: No family/caregiver present to determine baseline cognitive functioning                                 General Comments: Oriented to self, place, and limited situation. Noted with decreased safety awareness and decreased short-term recall during session.     General Comments       Exercises Other Exercises Other Exercises: OT facilitated safe transfer to/from [facility] and toileting this date. Pt educated on falls prevention strategies, safe use of AE/DME for ADL  management, and routines modifications to support safety and funcitonal independence during ADL management.   Shoulder Instructions      Home Living Family/patient expects to be discharged to:: Private residence Living Arrangements: Alone;Spouse/significant other (Pt provides variable information during session. Unreliable historian.) Available Help at Discharge: Friend(s);Available PRN/intermittently Type of Home: Mobile home Home Access: Stairs to enter Entrance Stairs-Number of Steps: 1 Entrance Stairs-Rails: Right Home Layout: One level     Bathroom Shower/Tub: Producer, television/film/video: Standard Bathroom Accessibility: Yes   Home Equipment: Agricultural consultant (2 wheels);Shower seat          Prior Functioning/Environment Prior Level of Function : Needs assist       Physical Assist : ADLs (physical)   ADLs (physical): Dressing;IADLs Mobility Comments: no AD at baseline ADLs Comments: Pt reports she requires assistance from her significant other at baseline for LB dressing and IADL Management. Endorses 1 fall in last 6 months. Unsure of true PLOF as pt noted with some cognitive deficits during evaluation and no family present to determine baseline.        OT Problem List: Decreased coordination;Decreased strength;Decreased range of motion;Decreased cognition;Decreased activity tolerance;Decreased safety awareness;Impaired balance (sitting and/or standing);Decreased knowledge of use of DME or AE      OT Treatment/Interventions: Self-care/ADL training;Therapeutic exercise;Therapeutic activities;DME and/or AE instruction;Patient/family education;Balance training;Energy conservation;Cognitive remediation/compensation    OT Goals(Current goals can be found in the care plan section) Acute Rehab OT Goals Patient Stated Goal: To go home OT  Goal Formulation: With patient Time For Goal Achievement: 09/11/21 Potential to Achieve Goals: Good  OT Frequency: Min 2X/week     Co-evaluation              AM-PAC OT "6 Clicks" Daily Activity     Outcome Measure Help from another person eating meals?: None Help from another person taking care of personal grooming?: A Little Help from another person toileting, which includes using toliet, bedpan, or urinal?: A Lot Help from another person bathing (including washing, rinsing, drying)?: A Lot Help from another person to put on and taking off regular upper body clothing?: A Little Help from another person to put on and taking off regular lower body clothing?: A Lot 6 Click Score: 16   End of Session Equipment Utilized During Treatment: Gait belt;Rolling walker (2 wheels) (BSC)  Activity Tolerance: Patient tolerated treatment well Patient left: in bed;with call bell/phone within reach;with bed alarm set  OT Visit Diagnosis: Other abnormalities of gait and mobility (R26.89);History of falling (Z91.81);Muscle weakness (generalized) (M62.81)                Time: 7829-5621 OT Time Calculation (min): 26 min Charges:  OT General Charges $OT Visit: 1 Visit OT Evaluation $OT Eval Moderate Complexity: 1 Mod OT Treatments $Self Care/Home Management : 8-22 mins  Rockney Ghee, M.S., OTR/L Ascom: [phone] 08/28/21, 12:11 PM

## 2021-08-28 NOTE — Plan of Care (Signed)
  Problem: Education: Goal: Knowledge of disease or condition will improve Outcome: Progressing Goal: Knowledge of secondary prevention will improve (SELECT ALL) Outcome: Progressing Goal: Knowledge of patient specific risk factors will improve (INDIVIDUALIZE FOR PATIENT) Outcome: Progressing Goal: Individualized Educational Video(s) Outcome: Progressing   

## 2021-08-28 NOTE — Evaluation (Addendum)
Physical Therapy Evaluation ?Patient Details ?Name: Robin Jensen ?MRN: [id number] ?DOB: January 28, 1961 ?Today's Date: 08/28/2021 ? ?History of Present Illness ? Patient is a 61 year old female with a PMH (+) for bipolar 1 disorder, depression GERD, recurrent UTIs hypertension, anemia, hypothyroidism, migraine, seizure disorder, history of cerebral aneurysm repair who presented to the ED on 08/27/21 with reports of AMS and facial droop. ?  ?Clinical Impression ? Physical Therapy Evaluation completed this date. Patient tolerated session well and was agreeable to treatment. No pain reported throughout session. Patient was alert and oriented x3 (self location and situation), however during conversation demonstrated cognitive deficits. Patient originally reported that she lives in a mobile home by herself and was Mod I with all mobility and ADLs with a RW at home if needed. Later in the session, patient reported living with her boyfriend, and he normally dons her socks for her as she is unable. Patient reports only 1 STE with R hand rails.  ? ?Patient demonstrated decreased strength in BUEs RUE>LUE, and at least 3+/5 strength in BLEs. Patient was able to complete all bed mobility at SUP to Mod I with increased time and effort required for supine to sit. Sit to stand from EOB and BSC as well as stand pivot transfer to [facility] was completed at SUP with no AD. Set up assist required for pericare. Patient was then able to ambulate around the nurses station without AD at SBA/SUP with no LOB noted and only mild unsteadiness. Patient is demonstrated at/near baseline level of function, and would benefit from continued skilled physical therapy in order to optimize patient's return to PLOF. Recommend STR upon discharge from acute hospitalization.  ?   ? ?Recommendations for follow up therapy are one component of a multi-disciplinary discharge planning process, led by the attending physician.  Recommendations may be updated based on  patient status, additional functional criteria and insurance authorization. ? ?Follow Up Recommendations Skilled nursing-short term rehab (<3 hours/day) ? ?  ?Assistance Recommended at Discharge PRN  ?Patient can return home with the following ? A little help with walking and/or transfers;Help with stairs or ramp for entrance;Assist for transportation;Two people to help with bathing/dressing/bathroom ? ?  ?Equipment Recommendations Other (comment) (TBD)  ?Recommendations for Other Services ?    ?  ?Functional Status Assessment    ? ?  ?Precautions / Restrictions Precautions ?Precautions: Fall ?Restrictions ?Weight Bearing Restrictions: No  ? ?  ? ?Mobility ? Bed Mobility ?Overal bed mobility: Needs Assistance ?Bed Mobility: Supine to Sit, Sit to Supine ?  ?  ?Supine to sit: Supervision (increased time and effort (sig use of bed rails to complete)) ?Sit to supine: Modified independent (Device/Increase time) ?  ?  ?  ? ?Transfers ?Overall transfer level: Needs assistance ?Equipment used: None ?Transfers: Sit to/from Stand, Bed to chair/wheelchair/BSC (from EOB and [facility]) ?Sit to Stand: Supervision ?  ?Step pivot transfers: Supervision ?  ?  ?  ?General transfer comment: no physical assistance required, minimal unsteadiness due to patient's first time being out of bed ?  ? ?Ambulation/Gait ?Ambulation/Gait assistance: Supervision ?Gait Distance (Feet): 160 Feet ?Assistive device: None ?Gait Pattern/deviations: Step-through pattern, Decreased step length - right, Decreased step length - left, Decreased stride length, Narrow base of support ?Gait velocity: decreased ?  ?  ?General Gait Details: decreased recirpical arm swing bilaterally, no LOB noted, mild unsteadiness ? ?Stairs ?  ?  ?  ?  ?  ? ?Wheelchair Mobility ?  ? ?Modified Rankin (Stroke Patients Only) ?  ? ?  ? ?  Balance Overall balance assessment: Needs assistance ?Sitting-balance support: Feet supported ?Sitting balance-Leahy Scale: Normal ?  ?  ?Standing balance  support: No upper extremity supported, During functional activity ?Standing balance-Leahy Scale: Fair ?Standing balance comment: SBA/SUP during ambulation ?  ?  ?  ?  ?  ?  ?  ?  ?  ?  ?  ?   ? ? ? ?Pertinent Vitals/Pain Pain Assessment ?Pain Assessment: No/denies pain  ? ? ?Home Living Family/patient expects to be discharged to:: Private residence ?Living Arrangements: Alone (Patient initially reported living alone, however reported that her boyfriend who she lives with normally dons her socks) ?Available Help at Discharge: Friend(s);Available PRN/intermittently ?Type of Home: Mobile home ?Home Access: Stairs to enter ?Entrance Stairs-Rails: Right ?Entrance Stairs-Number of Steps: 1 ?  ?Home Layout: One level ?Home Equipment: Conservation officer, nature (2 wheels);Shower seat ?   ?  ?Prior Function Prior Level of Function : Independent/Modified Independent ?  ?  ?  ?  ?  ?  ?Mobility Comments: no AD at baseline ?ADLs Comments: Independent ?  ? ? ?Hand Dominance  ? Dominant Hand: Right ? ?  ?Extremity/Trunk Assessment  ? Upper Extremity Assessment ?Upper Extremity Assessment: RUE deficits/detail;LUE deficits/detail ?RUE Deficits / Details: at least 3+/5 strength ?LUE Deficits / Details: at least 4+/5 strength ?  ? ?Lower Extremity Assessment ?Lower Extremity Assessment: Generalized weakness (at leat 3+/5 bilaterally) ?  ? ?   ?Communication  ? Communication: No difficulties  ?Cognition Arousal/Alertness: Awake/alert ?Behavior During Therapy: [facility] for tasks assessed/performed ?Overall Cognitive Status: Within Functional Limits for tasks assessed ?  ?  ?  ?  ?  ?  ?  ?  ?  ?  ?  ?  ?  ?  ?  ?  ?General Comments: A&Ox3 self location and situation ?  ?  ? ?  ?General Comments   ? ?  ?Exercises Other Exercises ?Other Exercises: patient educated on role of PT in acute care setting, fall risk, and d/c recommendations  ? ?Assessment/Plan  ?  ?PT Assessment Patient needs continued PT services  ?PT Problem List Decreased strength;Decreased  mobility;Decreased activity tolerance;Decreased cognition;Decreased balance ? ?   ?  ?PT Treatment Interventions Therapeutic activities;Gait training;Therapeutic exercise;Stair training;Balance training;Cognitive remediation   ? ?PT Goals (Current goals can be found in the Care Plan section)  ?Acute Rehab PT Goals ?Patient Stated Goal: to go home ?PT Goal Formulation: With patient ?Time For Goal Achievement: 09/11/21 ?Potential to Achieve Goals: Good ? ?  ?Frequency Min 2X/week ?  ? ? ?Co-evaluation   ?  ?  ?  ?  ? ? ?  ?AM-PAC PT "6 Clicks" Mobility  ?Outcome Measure Help needed turning from your back to your side while in a flat bed without using bedrails?: None ?Help needed moving from lying on your back to sitting on the side of a flat bed without using bedrails?: None ?Help needed moving to and from a bed to a chair (including a wheelchair)?: A Little ?Help needed standing up from a chair using your arms (e.g., wheelchair or bedside chair)?: A Little ?Help needed to walk in hospital room?: A Little ?Help needed climbing 3-5 steps with a railing? : A Little ?6 Click Score: 20 ? ?  ?End of Session Equipment Utilized During Treatment: Gait belt ?Activity Tolerance: Patient tolerated treatment well ?Patient left: in bed (with labratory tech) ?Nurse Communication: Mobility status ?PT Visit Diagnosis: Unsteadiness on feet (R26.81);Muscle weakness (generalized) (M62.81) ?  ? ?Time: 0626-9485 ?PT  Time Calculation (min) (ACUTE ONLY): 19 min ? ? ?Charges:   PT Evaluation ?$PT Eval Low Complexity: 1 Low ?PT Treatments ?$Gait Training: 8-22 mins ?  ?   ? ? ?Iva Boop, PT  ?08/28/21. 10:03 AM ? ? ?

## 2021-08-28 NOTE — Consult Note (Signed)
Neurology Consultation ? ?Reason for Consult: Altered mental status, falls, staring spells ?Referring Physician: Dr. Sidney Ace ? ?CC: Staring spell, facial droop, altered mental status ? ?History is obtained from: Patient, chart ? ?HPI: Robin Jensen is a 61 y.o. female past medical history of bipolar disorder, migraine, seizures, history of stroke with no residual deficits, hypertension, hyperlipidemia, hypothyroidism, memory loss, history of falls and right-handed tremor, prior history of right ICA aneurysm clipping-follows with neurology-Dr. Melrose Nakayama at [facility], presented to the hospital for evaluation of multiple neurological complaints including right-sided facial droop, staring spells that became very frequent yesterday, confusion and ongoing falls, for which she sees neurology outpatient as well. ?Per EMS report facial droop lasted few minutes along with dysarthria and then resolved.  She had an episode where she went stiff and just stared straight forward and could not speak or interact for about 30 seconds.  She has been having more falls at home.  She reports that she has been having more frequent symptoms-headaches, falls and some right-sided weakness off and on for the past couple weeks. ?Given the multitude of these complaints, she was admitted for stroke/TIA work-up and rule out seizures.  Patient reports that she has been feeling unwell for the past week or 2.  She also has been having urinary frequency-has a history of frequent UTIs. ? ?Work-up thus far: noted to have a UTI. Had a fever of 100.4 Fahrenheit. Has been started on treatment for the UTI. Noncontrasted head CT unremarkable for bleed.MRI brain was negative for stroke. ? ?Reports compliance to medications.  Dilantin level was not checked.  Have ordered 1 for this morning. ? ?ROS: Full ROS was performed and is negative except as noted in the HPI.  ? ?Past Medical History:  ?Diagnosis Date  ? Aneurysm ([place])   ? head aneurysm   ? Anxiety    ? Bipolar 1 disorder ([place])   ? Cancer [facility])   ? uterine  ? Depression   ? Dry mouth   ? DVT of leg (deep venous thrombosis) ([place])   ? history of  ? Easy bruising   ? Eye dryness   ? Fatigue   ? GERD (gastroesophageal reflux disease)   ? Hyperlipidemia   ? Hypertension   ? Hypothyroid   ? Migraine   ? Migraine   ? Seizure disorder ([place])   ? Seizures ([place])   ? last year last seizure-had brain aneurysm. ON meds  ? Stroke [facility]) 1997  ? No deficits  ? Suicidal ideation   ? attempted overdoses in past  ? ?Family History  ?Problem Relation Age of Onset  ? Colon cancer Father   ?     less than age 82  ? Colon cancer Paternal Uncle   ?     51s  ? Colon cancer Paternal Grandfather   ? Breast cancer Maternal Aunt   ? Diabetes Sister   ? Arthritis Brother   ? Hematuria Mother   ? Kidney cancer Maternal Grandmother   ? Kidney disease Maternal Grandmother   ? Prostate cancer Neg Hx   ? ? ? ?Social History:  ? reports that she has been smoking. She has never used smokeless tobacco. She reports that she does not drink alcohol and does not use drugs. ? ?Medications ? ?Current Facility-Administered Medications:  ?  acetaminophen (TYLENOL) tablet 650 mg, 650 mg, Oral, Q6H PRN **OR** acetaminophen (TYLENOL) suppository 650 mg, 650 mg, Rectal, Q6H PRN, Mansy, Arvella Merles, MD ?  aspirin EC  tablet 81 mg, 81 mg, Oral, Daily, Sreenath, Sudheer B, MD, 81 mg at 08/28/21 0816 ?  atorvastatin (LIPITOR) tablet 40 mg, 40 mg, Oral, QHS, Mansy, Jan A, MD, 40 mg at 08/27/21 2258 ?  benztropine (COGENTIN) tablet 2 mg, 2 mg, Oral, Daily, Mansy, Jan A, MD, 2 mg at 08/28/21 4680 ?  busPIRone (BUSPAR) tablet 10 mg, 10 mg, Oral, TID, Mansy, Jan A, MD, 10 mg at 08/28/21 0809 ?  cefTRIAXone (ROCEPHIN) 1 g in sodium chloride 0.9 % 100 mL IVPB, 1 g, Intravenous, Q24H, Mansy, Jan A, MD ?  cyclobenzaprine (FLEXERIL) tablet 10 mg, 10 mg, Oral, Q8H PRN, Mansy, Jan A, MD ?  diazepam (VALIUM) tablet 5 mg, 5 mg, Oral, QHS, Mansy, Jan A, MD, 5 mg at 08/27/21 2258 ?   donepezil (ARICEPT) tablet 10 mg, 10 mg, Oral, QHS, Mansy, Jan A, MD, 10 mg at 08/27/21 2258 ?  enoxaparin (LOVENOX) injection 40 mg, 40 mg, Subcutaneous, Q24H, Mansy, Jan A, MD, 40 mg at 08/27/21 2259 ?  gabapentin (NEURONTIN) capsule 300 mg, 300 mg, Oral, QHS, Mansy, Jan A, MD, 300 mg at 08/27/21 2258 ?  hydrALAZINE (APRESOLINE) tablet 25 mg, 25 mg, Oral, TID, Mansy, Jan A, MD, 25 mg at 08/28/21 3212 ?  labetalol (NORMODYNE) tablet 300 mg, 300 mg, Oral, BID, Mansy, Jan A, MD, 300 mg at 08/28/21 2482 ?  lamoTRIgine (LAMICTAL) tablet 200 mg, 200 mg, Oral, Daily, Mansy, Jan A, MD, 200 mg at 08/28/21 5003 ?  levothyroxine (SYNTHROID) tablet 75 mcg, 75 mcg, Oral, QAC breakfast, Mansy, Jan A, MD, 75 mcg at 08/28/21 7048 ?  linaclotide (LINZESS) capsule 72 mcg, 72 mcg, Oral, QAC breakfast, Mansy, Jan A, MD, 72 mcg at 08/28/21 0755 ?  LORazepam (ATIVAN) injection 1 mg, 1 mg, Intravenous, Q1H PRN, Mansy, Jan A, MD ?  losartan (COZAAR) tablet 100 mg, 100 mg, Oral, Daily, Mansy, Jan A, MD, 100 mg at 08/28/21 8891 ?  magnesium hydroxide (MILK OF MAGNESIA) suspension 30 mL, 30 mL, Oral, Daily PRN, Mansy, Jan A, MD ?  ondansetron (ZOFRAN) tablet 4 mg, 4 mg, Oral, Q6H PRN **OR** ondansetron (ZOFRAN) injection 4 mg, 4 mg, Intravenous, Q6H PRN, Mansy, Jan A, MD ?  pantoprazole (PROTONIX) EC tablet 40 mg, 40 mg, Oral, BID, Mansy, Jan A, MD, 40 mg at 08/28/21 0809 ?  phenytoin (DILANTIN) ER capsule 400 mg, 400 mg, Oral, Daily, Mansy, Jan A, MD, 400 mg at 08/28/21 6945 ?  risperiDONE (RISPERDAL) tablet 0.5 mg, 0.5 mg, Oral, Daily, Mansy, Jan A, MD, 0.5 mg at 08/28/21 0806 ?  traZODone (DESYREL) tablet 25 mg, 25 mg, Oral, QHS PRN, Mansy, Jan A, MD ?  valbenazine [facility]) capsule 80 mg, 80 mg, Oral, QHS, Mansy, Jan A, MD, 80 mg at 08/27/21 2311 ?  venlafaxine XR (EFFEXOR-XR) 24 hr capsule 150 mg, 150 mg, Oral, Q breakfast, Mansy, Jan A, MD, 150 mg at 08/28/21 0754 ?  verapamil (CALAN-SR) CR tablet 120 mg, 120 mg, Oral, q morning,  Mansy, Jan A, MD, 120 mg at 08/28/21 0809 ? ? ?Exam: ?Current vital signs: ?BP (!) 185/93 (BP Location: Left Arm)   Pulse (!) 101   Temp 99.1 ?F (37.3 ?C) (Oral)   Resp 18   Ht '5\' 1"'$  (1.549 m)   Wt 81.6 kg   SpO2 94%   BMI 33.99 kg/m?  ?Vital signs in last 24 hours: ?Temp:  [98 ?F (36.7 ?C)-100.4 ?F (38 ?C)] 99.1 ?F (37.3 ?C) (05/07 0388) ?Pulse Rate:  [  76-101] 101 (05/07 0803) ?Resp:  [14-21] 18 (05/07 0803) ?BP: (134-185)/(66-117) 185/93 (05/07 0803) ?SpO2:  [94 %-100 %] 94 % (05/07 0803) ?Weight:  [81.6 kg-83.5 kg] 81.6 kg (05/06 2044) ?General: Awake alert in no distress ?HEENT: Normocephalic atraumatic ?CVs: Regular rhythm ?Respiratory: Breathing well saturating normally on room air ?Abdomen nondistended nontender ?Neurological exam ?Awake alert oriented x3 ?No dysarthria and no aphasia. ?Cranial nerves II to XII: Pupils equal round react light, extraocular movements appear intact, visual fields full, facial sensation intact, smile is symmetric, face is grossly symmetric at rest with subtle right facial weakness which is from prior Bell's palsy.  Tongue and palate midline. ?Motor examination with no drift in any of the 4 extremities. ?Sensation intact to touch without extinction ?Coordination with no dysmetria budged at rest she has a large amplitude resting tremor of the right hand..  I do not appreciate any cogwheeling. ? ?Labs ?I have reviewed labs in epic and the results pertinent to this consultation are: ? ?CBC ?   ?Component Value Date/Time  ? WBC 9.1 08/28/2021 0549  ? RBC 3.96 08/28/2021 0549  ? HGB 10.8 (L) 08/28/2021 0549  ? HCT 33.5 (L) 08/28/2021 0549  ? PLT 130 (L) 08/28/2021 0549  ? MCV 84.6 08/28/2021 0549  ? MCV 84.2 01/07/2013 1238  ? MCH 27.3 08/28/2021 0549  ? MCHC 32.2 08/28/2021 0549  ? RDW 13.6 08/28/2021 0549  ? LYMPHSABS 1.7 08/27/2021 1649  ? MONOABS 1.5 (H) 08/27/2021 1649  ? EOSABS 0.0 08/27/2021 1649  ? BASOSABS 0.0 08/27/2021 1649  ? ? ?CMP  ?   ?Component Value Date/Time  ?  NA 134 (L) 08/28/2021 0549  ? NA 130 (L) 01/07/2013 1218  ? K 3.8 08/28/2021 0549  ? CL 104 08/28/2021 0549  ? CO2 22 08/28/2021 0549  ? GLUCOSE 99 08/28/2021 0549  ? BUN 10 08/28/2021 0549  ? BUN 11 01/07/2013 1

## 2021-08-28 NOTE — Evaluation (Signed)
Speech Language Pathology Evaluation ?Patient Details ?Name: Robin Jensen ?MRN: [id number] ?DOB: 01-27-1961 ?Today's Date: 08/28/2021 ?Time: 6948-5462 ?SLP Time Calculation (min) (ACUTE ONLY): 15 min ? ?Problem List:  ?Patient Active Problem List  ? Diagnosis Date Noted  ? Altered mental status 08/27/2021  ? Seizure disorder ([place]) 08/27/2021  ? Anxiety and depression 08/27/2021  ? Dyslipidemia 08/27/2021  ? Acute lower urinary tract infection 08/27/2021  ? Hypokalemia 08/27/2021  ? AKI (acute kidney injury) ([place]) 08/27/2021  ? Hyponatremia 08/27/2021  ? Hypocalcemia 12/24/2020  ? Dizziness 11/20/2017  ? Ataxia 10/04/2017  ? Headache disorder 06/12/2017  ? Loss of memory 06/12/2017  ? 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 Borderline personality disorder ([place]) 01/28/2016  ? Falls 01/28/2016  ? Panic disorder 01/28/2016  ? Schizoaffective disorder, bipolar type ([place]) 01/28/2016  ? Spells of decreased attentiveness 01/28/2016  ? Cervical cancer ([place]) 05/06/2013  ? Aneurysm, cerebral, nonruptured 08/14/2012  ? Hypothyroidism 08/14/2012  ? Other and unspecified hyperlipidemia 08/14/2012  ? Seizures ([place]) 08/14/2012  ? Insomnia 08/14/2012  ? Unspecified constipation 08/07/2012  ? FHx: colon cancer 08/07/2012  ? Right sided abdominal pain 08/07/2012  ? Essential hypertension 08/07/2012  ? Abnormal LFTs 08/07/2012  ? ?Past Medical History:  ?Past Medical History:  ?Diagnosis Date  ? Aneurysm ([place])   ? head aneurysm   ? Anxiety   ? Bipolar 1 disorder ([place])   ? Cancer [facility])   ? uterine  ? Depression   ? Dry mouth   ? DVT of leg (deep venous thrombosis) ([place])   ? history of  ? Easy bruising   ? Eye dryness   ? Fatigue   ? GERD (gastroesophageal reflux disease)   ? Hyperlipidemia   ? Hypertension   ? Hypothyroid   ? Migraine   ? Migraine   ? Seizure disorder ([place])   ? Seizures ([place])   ? last year last  seizure-had brain aneurysm. ON meds  ? Stroke [facility]) 1997  ? No deficits  ? Suicidal ideation   ? attempted overdoses in past  ? ?Past Surgical History:  ?Past Surgical History:  ?Procedure Laterality Date  ? ABDOMINAL HYSTERECTOMY    ? CEREBRAL ANEURYSM REPAIR  1997  ? CESAREAN SECTION    ? CHOLECYSTECTOMY    ? COLONOSCOPY WITH PROPOFOL N/A 08/20/2012  ? Procedure: COLONOSCOPY WITH PROPOFOL;  Surgeon: [name], MD;  Location: [facility];  Service: Endoscopy;  Laterality: N/A;  In cecum at [place]; Total Withdrawal Time= 12 minutes  ? COLONOSCOPY WITH PROPOFOL N/A 09/28/2017  ? Procedure: COLONOSCOPY WITH PROPOFOL;  Surgeon: [name], MD;  Location: [facility] ENDOSCOPY;  Service: Endoscopy;  Laterality: N/A;  ? ESOPHAGOGASTRODUODENOSCOPY (EGD) WITH PROPOFOL N/A 11/20/2016  ? Procedure: ESOPHAGOGASTRODUODENOSCOPY (EGD) WITH PROPOFOL;  Surgeon: [name], MD;  Location: [facility] ENDOSCOPY;  Service: Endoscopy;  Laterality: N/A;  ? ESOPHAGOGASTRODUODENOSCOPY (EGD) WITH PROPOFOL N/A 09/28/2017  ? Procedure: ESOPHAGOGASTRODUODENOSCOPY (EGD) WITH PROPOFOL;  Surgeon: [name], MD;  Location: [facility] ENDOSCOPY;  Service: Endoscopy;  Laterality: N/A;  ? RADICAL HYSTERECTOMY    ? ?HPI:  ?Per H&P "Robin Jensen is a 61 y.o. female with medical history significant for bipolar 1 disorder, depression GERD, recurrent UTIs hypertension, anemia, hypothyroidism, migraine, seizure disorder, history of cerebral aneurysm repair, who presented to the emergency room with acute onset of intermittent altered mental status with associated altered left facial  droop with lightheadedness.  She reportedly had a fall with brief loss of consciousness.  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  Facial droop and dysarthria lasted about 5 minutes and resolved by arrival EMS.  She had a reported episode where her body went stiff and she stared straight forward and could not speak or interact with  EMS staff.  Her symptoms resolved within 30 seconds after which the patient did not remember this event.  She denies any current paresthesias or new focal muscle weakness.  She admits to urinary frequency and urgency without dysuria, oliguria, hematuria or flank pain.  No fever or chills.  No neck pain or stiffness.  No chest pain or palpitations.  No nausea or vomiting or abdominal pain.  No tinnitus or vertigo.  No cough or wheezing or dyspnea.    ED Course: Upon presentation to the ER respiratory rate was 21 and vital signs were otherwise within normal.  CBC showed WBC of 12.3 with neutrophilia and anemia close to baseline.  Blood glucose was 90.  CMP revealed a sodium of 133 with a potassium of 3.3, creatinine 1.35 above previous levels and albumin 3.2 and total protein 6.3 better than previous levels.  UA came back positive for UTI.  EKG as reviewed by me : EKG showed normal sinus rhythm with a rate of 82 with T wave inversion anterolaterally.  Imaging: Noncontrasted head CT scan revealed stable postprocedural changes with a metallic density right ICA aneurysm clip noted and right frontal craniotomy defect without evidence of acute intracranial abnormality.    The patient was given a gram of IV Rocephin, 1 L bolus of IV normal saline and 325 mg p.o. aspirin.  She will be admitted to an observation medical telemetry bed for further evaluation and management."  ? ?Assessment / Plan / Recommendation ?Clinical Impression ? Pt seen for cognitive-linguistic evaluation. Pt alert. Slow to respond at times.  ? ?Assessment completed via informal means and portions of Cognistat. Pt presents with cognitive-linguistic deficits affecting attention (sustained), memory (immediate, short term, functional), problem solving (verbal), reasoning (abstract), and insight. Pt alert, oriented to self, location, and situation. Pt not oriented to time ("August 6th, 2023"). Pt's speech is fluent, confused/tangential at times, and with  minimal articulatory imprecision; ?baseline due to dental status. Pt with difficulty coding and recalling x4 unrelated words immediately with with ~10 minute delay. It took x5 trials to code words immediately. With delay, pt recalled 0/4 independently; 2/4 with category cues; 0/4 with choices. Pt was able to provided logical solutions to household problems on 1/3 instances. Pt scored 4/8 on Reasoning subtest of Cognistat (abstract categorizations). Pt with inconsistent ability to answer complex yes/no questions to follow complex 2-step commands. Suspect pt's reduced attention affecting all domains of cognitive-linguistic functioning. Pt with reduced error awareness throughout evaluation. No family present to provide details of pt's PLOF. Noted pt with documented hx of dementia; however, pt reported managing finances and medication. ? ?Based on today's assessment, anticipate need for continued SLP services at d/c for functional cognitive-linguistic evaluation/tx in home environment.  ? ?SLP to f/u per POC for further diagnostic evaluation/tx.  ? ?Pt and RN made aware of results, recommendations, and SLP POC. Pt verbalized understanding/agreement. ?   ?SLP Assessment ? SLP Recommendation/Assessment: Patient needs continued [facility] ?SLP Visit Diagnosis: Cognitive communication deficit (R41.841)  ?  ?Recommendations for follow up therapy are one component of a multi-disciplinary discharge planning process, led by the attending  physician.  Recommendations may be updated based on patient status, additional functional criteria and insurance authorization. ?   ?Follow Up Recommendations ? Skilled nursing-short term rehab (<3 hours/day)  ?  ?Assistance Recommended at Discharge ? Intermittent Supervision/Assistance  ?Functional Status Assessment Patient has had a recent decline in their functional status and demonstrates the ability to make significant improvements in function in a reasonable and  predictable amount of time.  ?Frequency and Duration min 2x/week  ?2 weeks ?  ?   ?SLP Evaluation ?Cognition ? Overall Cognitive Status: No family/caregiver present to determine baseline cognitive functionin

## 2021-08-29 DIAGNOSIS — R4182 Altered mental status, unspecified: Secondary | ICD-10-CM | POA: Diagnosis not present

## 2021-08-29 DIAGNOSIS — N390 Urinary tract infection, site not specified: Secondary | ICD-10-CM | POA: Diagnosis not present

## 2021-08-29 DIAGNOSIS — R410 Disorientation, unspecified: Secondary | ICD-10-CM | POA: Diagnosis not present

## 2021-08-29 MED ORDER — CLONIDINE HCL 0.1 MG PO TABS
0.1000 mg | ORAL_TABLET | Freq: Two times a day (BID) | ORAL | Status: DC
  Administered 2021-08-29 – 2021-08-30 (×4): 0.1 mg via ORAL
  Filled 2021-08-29 (×4): qty 1

## 2021-08-29 MED ORDER — LOPERAMIDE HCL 2 MG PO CAPS
2.0000 mg | ORAL_CAPSULE | ORAL | Status: DC | PRN
  Administered 2021-08-29 (×2): 2 mg via ORAL
  Filled 2021-08-29 (×2): qty 1

## 2021-08-29 NOTE — Progress Notes (Signed)
?PROGRESS NOTE ? ? ? ?Robin Jensen  GGE:[id number] DOB: 02-Jan-1961 DOA: 08/27/2021 ?PCP: Donnie Coffin, MD  ? ? ?Brief Narrative:  ?61 y.o. female with medical history significant for bipolar 1 disorder, depression GERD, recurrent UTIs hypertension, anemia, hypothyroidism, migraine, seizure disorder, history of cerebral aneurysm repair, who presented to the emergency room with acute onset of intermittent altered mental status with associated altered left facial droop with lightheadedness.  She reportedly had a fall with brief loss of consciousness.  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  Facial droop and dysarthria lasted about 5 minutes and resolved by arrival EMS.  She had a reported episode where her body went stiff and she stared straight forward and could not speak or interact with EMS staff.  Her symptoms resolved within 30 seconds after which the patient did not remember this event.  She denies any current paresthesias or new focal muscle weakness.  She admits to urinary frequency and urgency without dysuria, oliguria, hematuria or flank pain.  No fever or chills.  No neck pain or stiffness.  No chest pain or palpitations.  No nausea or vomiting or abdominal pain.  No tinnitus or vertigo.  No cough or wheezing or dyspnea. ? ?MRI negative.  Discussed with neurology.  Plan for routine EEG 5/8.  Tmax 100.4 ? ? ?Assessment & Plan: ?  ?Principal Problem: ?  Altered mental status ?Active Problems: ?  Seizure disorder ([place]) ?  Acute lower urinary tract infection ?  Hypokalemia ?  AKI (acute kidney injury) ([name]) ?  Essential hypertension ?  Hyponatremia ?  Hypothyroidism ?  Schizoaffective disorder, bipolar type ([name]) ?  Anxiety and depression ?  Dyslipidemia ?  AMS (altered mental status) ? ?Altered mentation ?Acute metabolic encephalopathy ?TIA remains on differential however MRI negative.  Suspecting metabolic encephalopathy secondary to UTI ?Plan: ?Treatment for UTI  with IV Rocephin ?PT OT ST consults, recommendation for skilled nursing facility ?Spot EEG to be completed 5/8, pending ?Case discussed with neurology Dr. Malen Gauze ? ?Seizure disorder ?Continue home regimen of Valium, phenytoin, Neurontin ?IV Ativan as needed ?Check phenytoin level, reassuring ? ?Acute cystitis ?Likely contributing to metabolic encephalopathy ?Treatment with IV Rocephin ?Monitor vitals and fever curve ?Monitor urine culture ? ?Hypokalemia ?Monitor and replace potassium as necessary ? ?Acute kidney injury ?Suspect prerenal azotemia ?IV hydration ?Avoid nephrotoxins ? ?Hyponatremia ?Suspect intravascular volume depletion ?IV hydration ? ?Essential hypertension ?Patient with refractory hypertension on multiple agents ?Continue home agents with holding parameters ? ?Hypothyroidism ?PTA Synthroid ? ?Schizoaffective disorder ?Bipolar 1 disorder ?PTA Ingrezza, Risperdal, Cogentin ? ?Hyperlipidemia ?PTA statin ? ?Anxiety ?Depression ?PTA BuSpar, Valium, Pristiq ? ? ? ?DVT prophylaxis: SQ Lovenox ?Code Status: Full ?Family Communication: None today ?Disposition Plan: Status is: Inpatient ?Remains inpatient appropriate because: UTI on IV antibiotics with associated metabolic encephalopathy.  Pending EEG rule out underlying seizure activity ? ? ? ? ? ? ?Level of care: Telemetry Medical ? ?Consultants:  ?Neurology ? ?Procedures:  ?None ? ?Antimicrobials: ?Ceftriaxone ? ? ?Subjective: ?Seen and examined.  Resting comfortably in bed.  No visible distress.  Resting tremor noted. ? ?Objective: ?Vitals:  ? 08/28/21 1951 08/29/21 0109 08/29/21 4650 08/29/21 3546  ?BP: (!) 188/96 (!) 187/103 (!) 190/101 (!) 194/106  ?Pulse: 90 95 82 80  ?Resp: '18 18 18 16  '$ ?Temp: 99.4 ?F (37.4 ?C) 98.5 ?F (36.9 ?C) 98.5 ?F (36.9 ?C) 98.1 ?F (36.7 ?C)  ?TempSrc: Oral   Oral  ?SpO2: 98% 96% 97% 97%  ?  Weight:      ?Height:      ? ? ?Intake/Output Summary (Last 24 hours) at 08/29/2021 1403 ?Last data filed at 08/29/2021 1345 ?Gross per 24 hour   ?Intake 280.06 ml  ?Output 1450 ml  ?Net -1169.94 ml  ? ?Filed Weights  ? 08/27/21 2004 08/27/21 2044  ?Weight: 83.5 kg 81.6 kg  ? ? ?Examination: ? ?General exam: No acute distress.  Flattened affect ?Respiratory system: Lungs clear.  Normal work of breathing.  Room air ?Cardiovascular system: S1-S2, RRR, no murmurs, no pedal edema ?Gastrointestinal system: Soft, NT/ND, normal bowel sounds ?Central nervous system: Alert and oriented. No focal neurological deficits.  Resting tremor  ?Extremities: Symmetric 5 x 5 power. ?Skin: No rashes, lesions or ulcers ?Psychiatry: Judgement and insight appear normal. Mood & affect flattened.  ? ? ? ?Data Reviewed: I have personally reviewed following labs and imaging studies ? ?CBC: ?Recent Labs  ?Lab 08/27/21 ?1649 08/28/21 ?7341  ?WBC 12.0* 9.1  ?NEUTROABS 8.7*  --   ?HGB 11.0* 10.8*  ?HCT 33.4* 33.5*  ?MCV 83.7 84.6  ?PLT 147* 130*  ? ?Basic Metabolic Panel: ?Recent Labs  ?Lab 08/27/21 ?1649 08/28/21 ?9379  ?NA 133* 134*  ?K 3.3* 3.8  ?CL 98 104  ?CO2 25 22  ?GLUCOSE 90 99  ?BUN 14 10  ?CREATININE 1.35* 1.13*  ?CALCIUM 8.7* 8.3*  ?MG 2.1  --   ? ?GFR: ?Estimated Creatinine Clearance: 50.6 mL/min (A) (by C-G formula based on SCr of 1.13 mg/dL (H)). ?Liver Function Tests: ?Recent Labs  ?Lab 08/27/21 ?1649 08/28/21 ?0240  ?AST 38 39  ?ALT 26 24  ?ALKPHOS 81 78  ?BILITOT 0.8 0.6  ?PROT 6.3* 6.2*  ?ALBUMIN 3.2* 3.0*  ? ?No results for input(s): LIPASE, AMYLASE in the last 168 hours. ?No results for input(s): AMMONIA in the last 168 hours. ?Coagulation Profile: ?Recent Labs  ?Lab 08/27/21 ?1649  ?INR 1.0  ? ?Cardiac Enzymes: ?No results for input(s): CKTOTAL, CKMB, CKMBINDEX, TROPONINI in the last 168 hours. ?BNP (last 3 results) ?No results for input(s): PROBNP in the last 8760 hours. ?HbA1C: ?Recent Labs  ?  08/27/21 ?1649  ?HGBA1C 5.5  ? ?CBG: ?Recent Labs  ?Lab 08/27/21 ?1647  ?GLUCAP 92  ? ?Lipid Profile: ?Recent Labs  ?  08/28/21 ?9735  ?CHOL 124  ?HDL 35*  ?[place] 60  ?TRIG  146  ?CHOLHDL 3.5  ? ?Thyroid Function Tests: ?Recent Labs  ?  08/27/21 ?2235  ?TSH 2.791  ? ?Anemia Panel: ?No results for input(s): VITAMINB12, FOLATE, FERRITIN, TIBC, IRON, RETICCTPCT in the last 72 hours. ?Sepsis Labs: ?No results for input(s): PROCALCITON, LATICACIDVEN in the last 168 hours. ? ?No results found for this or any previous visit (from the past 240 hour(s)).  ? ? ? ? ? ?Radiology Studies: ?CT HEAD WO CONTRAST ? ?Result Date: 08/27/2021 ?CLINICAL DATA:  Transient ischemic attack. EXAM: CT HEAD WITHOUT CONTRAST TECHNIQUE: Contiguous axial images were obtained from the base of the skull through the vertex without intravenous contrast. RADIATION DOSE REDUCTION: This exam was performed according to the departmental dose-optimization program which includes automated exposure control, adjustment of the mA and/or kV according to patient size and/or use of iterative reconstruction technique. COMPARISON:  December 24, 2020 FINDINGS: Brain: No evidence of acute infarction, hemorrhage, hydrocephalus, extra-axial collection or mass lesion/mass effect. Vascular: No hyperdense vessel or unexpected calcification. A metallic density right ICA aneurysm clip is noted. Skull: A right frontal craniotomy defect is seen. Sinuses/Orbits: No acute finding. Other: None.  IMPRESSION: 1. Stable postprocedural changes, without evidence of an acute intracranial abnormality. Electronically Signed   By: Virgina Norfolk M.D.   On: 08/27/2021 17:52  ? ?MR BRAIN WO CONTRAST ? ?Result Date: 08/27/2021 ?CLINICAL DATA:  Transient ischemic attack. Facial droop. Right-sided weakness. Confusion. EXAM: MRI HEAD WITHOUT CONTRAST TECHNIQUE: Multiplanar, multiecho pulse sequences of the brain and surrounding structures were obtained without intravenous contrast. COMPARISON:  Head CT earlier same day.  MRI 12/25/2020. FINDINGS: Brain: Diffusion imaging does not show any acute or subacute infarction. There are minimal chronic small-vessel ischemic  changes of the pons. No focal cerebellar abnormality. Cerebral hemispheres show mild chronic small-vessel ischemic changes of the white matter. No cortical or large vessel territory infarction. There

## 2021-08-29 NOTE — Progress Notes (Signed)
Eeg done 

## 2021-08-29 NOTE — [place] FL2 (Signed)
?[place] MEDICAID FL2 LEVEL OF CARE SCREENING TOOL  ?  ? ?IDENTIFICATION  ?Patient Name: ?Robin Jensen Birthdate: 13-Apr-1961 Sex: female Admission Date (Current Location): ?08/27/2021  ?[place] and [place] Number: ?  ?  Facility and Address:  ?[facility], [address] [place], [place] [address] ?     Provider Number: ?[id number]  ?Attending Physician Name and Address:  ?Sidney Ace, MD ? Relative Name and Phone Number:  ?Milbert Coulter (friend) [phone] ?   ?Current Level of Care: ?  Recommended Level of Care: ?[place] Prior Approval Number: ?  ? ?Date Approved/Denied: ?  PASRR Number: ?[id number] A ? ?Discharge Plan: ?SNF ?  ? ?Current Diagnoses: ?Patient Active Problem List  ? Diagnosis Date Noted  ? AMS (altered mental status) 08/28/2021  ? Altered mental status 08/27/2021  ? Seizure disorder ([place]) 08/27/2021  ? Anxiety and depression 08/27/2021  ? Dyslipidemia 08/27/2021  ? Acute lower urinary tract infection 08/27/2021  ? Hypokalemia 08/27/2021  ? AKI (acute kidney injury) ([place]) 08/27/2021  ? Hyponatremia 08/27/2021  ? Hypocalcemia 12/24/2020  ? Dizziness 11/20/2017  ? Ataxia 10/04/2017  ? Headache disorder 06/12/2017  ? Loss of memory 06/12/2017  ? 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 Borderline personality disorder ([place]) 01/28/2016  ? Falls 01/28/2016  ? Panic disorder 01/28/2016  ? Schizoaffective disorder, bipolar type ([place]) 01/28/2016  ? Spells of decreased attentiveness 01/28/2016  ? Cervical cancer ([place]) 05/06/2013  ? Aneurysm, cerebral, nonruptured 08/14/2012  ? Hypothyroidism 08/14/2012  ? Other and unspecified hyperlipidemia 08/14/2012  ? Seizures ([place]) 08/14/2012  ? Insomnia 08/14/2012  ? Unspecified constipation 08/07/2012  ? FHx: colon cancer 08/07/2012  ? Right sided abdominal pain 08/07/2012  ? Essential  hypertension 08/07/2012  ? Abnormal LFTs 08/07/2012  ? ? ?Orientation RESPIRATION BLADDER Height & Weight   ?  ?Self, Time, Situation, Place ? Normal Incontinent, External catheter Weight: 179 lb 14.3 oz (81.6 kg) ?Height:  '5\' 1"'$  (154.9 cm)  ?BEHAVIORAL SYMPTOMS/MOOD NEUROLOGICAL BOWEL NUTRITION STATUS  ?    Incontinent Diet (see discharge summary)  ?AMBULATORY STATUS COMMUNICATION OF NEEDS Skin   ?Limited Assist Verbally Normal ?  ?  ?  ?    ?     ?     ? ? ?Personal Care Assistance Level of Assistance  ?Bathing, Dressing, Feeding, Total care Bathing Assistance: Limited assistance ?Feeding assistance: Independent ?Dressing Assistance: Limited assistance ?Total Care Assistance: Limited assistance  ? ?Functional Limitations Info  ?Sight, Speech, Hearing Sight Info: Adequate ?Hearing Info: Adequate ?Speech Info: Adequate  ? ? ?SPECIAL CARE FACTORS FREQUENCY  ?PT (By licensed PT), OT (By licensed OT)   ?  ?PT Frequency: min 4x weekly ?OT Frequency: min 4x weekly ?  ?  ?  ?   ? ? ?Contractures Contractures Info: Not present  ? ? ?Additional Factors Info  ?Code Status, Allergies Code Status Info: full ?Allergies Info: bee venom, penicillins, latex, lisinopril, sulfa antibiotics ?  ?  ?  ?   ? ?Current Medications (08/29/2021):  This is the current hospital active medication list ?Current Facility-Administered Medications  ?Medication Dose Route Frequency Provider Last Rate Last Admin  ? acetaminophen (TYLENOL) tablet 650 mg  650 mg Oral Q6H PRN Mansy, Jan A, MD   650 mg at 08/28/21 1339  ? Or  ? acetaminophen (TYLENOL) suppository 650 mg  650 mg Rectal Q6H PRN Mansy, Jan  A, MD      ? aspirin EC tablet 81 mg  81 mg Oral Daily Ralene Muskrat B, MD   81 mg at 08/29/21 1610  ? atorvastatin (LIPITOR) tablet 40 mg  40 mg Oral QHS Mansy, Jan A, MD   40 mg at 08/28/21 2139  ? benztropine (COGENTIN) tablet 2 mg  2 mg Oral Daily Mansy, Jan A, MD   2 mg at 08/29/21 9604  ? busPIRone (BUSPAR) tablet 10 mg  10 mg Oral TID Mansy, Jan  A, MD   10 mg at 08/29/21 1454  ? cefTRIAXone (ROCEPHIN) 1 g in sodium chloride 0.9 % 100 mL IVPB  1 g Intravenous Q24H Mansy, Arvella Merles, MD   Stopped at 08/28/21 2207  ? cloNIDine (CATAPRES) tablet 0.1 mg  0.1 mg Oral BID Ralene Muskrat B, MD   0.1 mg at 08/29/21 1454  ? cyclobenzaprine (FLEXERIL) tablet 10 mg  10 mg Oral Q8H PRN Mansy, Jan A, MD      ? diazepam (VALIUM) tablet 5 mg  5 mg Oral QHS Mansy, Jan A, MD   5 mg at 08/28/21 2138  ? donepezil (ARICEPT) tablet 10 mg  10 mg Oral QHS Mansy, Jan A, MD   10 mg at 08/28/21 2144  ? enoxaparin (LOVENOX) injection 40 mg  40 mg Subcutaneous Q24H Mansy, Jan A, MD   40 mg at 08/28/21 2141  ? gabapentin (NEURONTIN) capsule 300 mg  300 mg Oral QHS Mansy, Jan A, MD   300 mg at 08/28/21 2139  ? hydrALAZINE (APRESOLINE) tablet 25 mg  25 mg Oral TID Mansy, Jan A, MD   25 mg at 08/29/21 1454  ? labetalol (NORMODYNE) tablet 300 mg  300 mg Oral BID Mansy, Jan A, MD   300 mg at 08/29/21 5409  ? lamoTRIgine (LAMICTAL) tablet 200 mg  200 mg Oral Daily Mansy, Jan A, MD   200 mg at 08/29/21 8119  ? levothyroxine (SYNTHROID) tablet 75 mcg  75 mcg Oral QAC breakfast Mansy, Arvella Merles, MD   75 mcg at 08/29/21 0526  ? linaclotide (LINZESS) capsule 72 mcg  72 mcg Oral QAC breakfast Mansy, Jan A, MD   72 mcg at 08/29/21 1478  ? loperamide (IMODIUM) capsule 2 mg  2 mg Oral PRN Ralene Muskrat B, MD   2 mg at 08/29/21 1454  ? LORazepam (ATIVAN) injection 1 mg  1 mg Intravenous Q1H PRN Mansy, Jan A, MD      ? losartan (COZAAR) tablet 100 mg  100 mg Oral Daily Mansy, Jan A, MD   100 mg at 08/29/21 2956  ? magnesium hydroxide (MILK OF MAGNESIA) suspension 30 mL  30 mL Oral Daily PRN Mansy, Jan A, MD      ? ondansetron [facility]) tablet 4 mg  4 mg Oral Q6H PRN Mansy, Jan A, MD      ? Or  ? ondansetron [facility]) injection 4 mg  4 mg Intravenous Q6H PRN Mansy, Jan A, MD      ? pantoprazole (PROTONIX) EC tablet 40 mg  40 mg Oral BID Mansy, Jan A, MD   40 mg at 08/29/21 0827  ? phenytoin (DILANTIN) ER  capsule 400 mg  400 mg Oral Daily Mansy, Jan A, MD   400 mg at 08/29/21 1110  ? risperiDONE (RISPERDAL) tablet 0.5 mg  0.5 mg Oral Daily Mansy, Jan A, MD   0.5 mg at 08/29/21 2130  ? traZODone (DESYREL) tablet 25 mg  25 mg Oral  QHS PRN Mansy, Arvella Merles, MD      ? valbenazine [facility]) capsule 80 mg  80 mg Oral QHS Mansy, Jan A, MD   80 mg at 08/28/21 2140  ? venlafaxine XR (EFFEXOR-XR) 24 hr capsule 150 mg  150 mg Oral Q breakfast Mansy, Jan A, MD   150 mg at 08/29/21 4481  ? verapamil (CALAN-SR) CR tablet 120 mg  120 mg Oral q morning Mansy, Jan A, MD   120 mg at 08/29/21 8563  ? ? ? ?Discharge Medications: ?Please see discharge summary for a list of discharge medications. ? ?Relevant Imaging Results: ? ?Relevant Lab Results: ? ? ?Additional Information ?SSN:[id number] ? ?Alberteen Sam, LCSW ? ? ? ? ?

## 2021-08-29 NOTE — Progress Notes (Signed)
Speech Language Pathology Treatment:    ?Patient Details ?Name: Robin Jensen ?MRN: [id number] ?DOB: [birth year] ?Today's Date: 08/29/2021 ?Time: 2992-4268 ?SLP Time Calculation (min) (ACUTE ONLY): 15 min ? ?Assessment / Plan / Recommendation ?Clinical Impression ? Pt seen for cognitive-linguistic tx. Upon SLP entrance to room, pt sleeping. Pt able to rouse to voice, but lethargic. LOA improved with repositioning.  ? ?Pt with fair participation in cognitive-linguistic tx. Pt easily distracted by environmental stimuli. Pt benefited from reduction in environmental stimuli (e.g. TV off, door closed). Pt with improved cognitive-linguistic ability compared to initial evaluation; however, deficits persist affecting attention, memory, and problem solving. Pt A&Ox4 with extra time and use of environmental aids. Pt able to generate logical solutions to routine household/hospital problems with 75% accuracy; improving to 100% with verbal cues for elaboration.  Pt able to follow 1-2 step basic commands with 100% accuracy; complex 2-step commands (e.g. before/after concepts) with 50% accuracy despite verbal/visual cueing. Noted pt with documented hx of dementia. ?baseline functional status. No family or support persons present to provide additional details of pt's baseline functional status.  ? ?Based on today's tx, recommend post-acute SLP services for cognitive-linguistic evaluation/tx in home/functional environment. Pt would benefit from assistance with iADLs (e.g. finances, medication management). ? ?SLP to sign off at this level of care. Further cognitive-linguistic needs may be addressed at next level of care.  ?  ?HPI HPI: Per H&P "Robin Jensen is a 61 y.o. female with medical history significant for bipolar 1 disorder, depression GERD, recurrent UTIs hypertension, anemia, hypothyroidism, migraine, seizure disorder, history of cerebral aneurysm repair, who presented to the emergency room with acute onset of  intermittent altered mental status with associated altered left facial droop with lightheadedness.  She reportedly had a fall with brief loss of consciousness.  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  Facial droop and dysarthria lasted about 5 minutes and resolved by arrival EMS.  She had a reported episode where her body went stiff and she stared straight forward and could not speak or interact with EMS staff.  Her symptoms resolved within 30 seconds after which the patient did not remember this event.  She denies any current paresthesias or new focal muscle weakness.  She admits to urinary frequency and urgency without dysuria, oliguria, hematuria or flank pain.  No fever or chills.  No neck pain or stiffness.  No chest pain or palpitations.  No nausea or vomiting or abdominal pain.  No tinnitus or vertigo.  No cough or wheezing or dyspnea.    ED Course: Upon presentation to the ER respiratory rate was 21 and vital signs were otherwise within normal.  CBC showed WBC of 12.3 with neutrophilia and anemia close to baseline.  Blood glucose was 90.  CMP revealed a sodium of 133 with a potassium of 3.3, creatinine 1.35 above previous levels and albumin 3.2 and total protein 6.3 better than previous levels.  UA came back positive for UTI.  EKG as reviewed by me : EKG showed normal sinus rhythm with a rate of 82 with T wave inversion anterolaterally.  Imaging: Noncontrasted head CT scan revealed stable postprocedural changes with a metallic density right ICA aneurysm clip noted and right frontal craniotomy defect without evidence of acute intracranial abnormality.    The patient was given a gram of IV Rocephin, 1 L bolus of IV normal saline and 325 mg p.o. aspirin.  She will be admitted to an observation medical telemetry bed for further  evaluation and management." ?  ?   ?SLP Plan ? All goals met (Further SLP services to completed at next level of care) ? ?  ?  ?Recommendations  for follow up therapy are one component of a multi-disciplinary discharge planning process, led by the attending physician.  Recommendations may be updated based on patient status, additional functional criteria and insurance authorization. ?  ? ?Recommendations  ?   ? ? ? ? Follow Up Recommendations: Skilled nursing-short term rehab (<3 hours/day) (vs HH if able to secure level of assistance needed for safe d/c) ?Assistance recommended at discharge: Intermittent Supervision/Assistance ?SLP Visit Diagnosis: Cognitive communication deficit (R41.841) ?Plan: All goals met (Further SLP services to completed at next level of care) ? ? ? ? ?  ?  ? ?Cherrie Gauze, M.S., CCC-SLP ?Speech-Language Pathologist ?[facility] ?[phone] ([place])  ? ?Quintella Baton ? ?08/29/2021, 12:05 PM ?

## 2021-08-29 NOTE — Progress Notes (Signed)
Occupational Therapy Treatment ?Patient Details ?Name: Robin Jensen ?MRN: [id number] ?DOB: [birth year] ?Today's Date: 08/29/2021 ? ? ?History of present illness Patient is a 61 year old female with a PMH (+) for bipolar 1 disorder, depression GERD, recurrent UTIs hypertension, anemia, hypothyroidism, migraine, seizure disorder, history of cerebral aneurysm repair who presented to the ED on 08/27/21 with reports of AMS and facial droop. ?  ?OT comments ? Ms. Skousen was seen for OT treatment on this date. Upon arrival to room pt awake/alert, on bed pain, eager for assistance. OT supports pt with bed level peri-care and transfer off bed pan. Pt agreeable to OT tx session and eager to get OOB. Pt continues to demonstrate decreased safety awarenss, decreased awareness of functional deficits, and decreased balance. MIN A for STS from EOB at start of session. pt able to progress to close CGA for STS/functional mobility during session. She is able to doff/don bilat hospital socks and personal brief with close supervsion and multimodal cueing for safety. Pt noted with mild L inattention during functional mobility with RW requires cueing for reconition of obstacles situated to her left during session. x1 instance of min R lateral LOB 2/2 poor regognition of an obstacle on her L while walking with RW. She requires MIN A to safely mantain balance. Pt verbalized understanding of instruction provided but requires consistent cueing t/o session to implement safety strategies. Pt making good progress toward goals and continues to benefit from skilled OT services to maximize return to PLOF and minimize risk of future falls, injury, caregiver burden, and readmission. Will continue to follow POC. Discharge recommendation remains appropriate.  ?  ? ?Recommendations for follow up therapy are one component of a multi-disciplinary discharge planning process, led by the attending physician.  Recommendations may be updated based on patient  status, additional functional criteria and insurance authorization. ?   ?Follow Up Recommendations ? Skilled nursing-short term rehab (<3 hours/day)  ?  ?Assistance Recommended at Discharge Frequent or constant Supervision/Assistance  ?Patient can return home with the following ? A little help with walking and/or transfers;A little help with bathing/dressing/bathroom;Assistance with cooking/housework;Help with stairs or ramp for entrance;Direct supervision/assist for financial management;Direct supervision/assist for medications management;Assist for transportation ?  ?Equipment Recommendations ? BSC/3in1  ?  ?Recommendations for Other Services   ? ?  ?Precautions / Restrictions Precautions ?Precautions: Fall ?Restrictions ?Weight Bearing Restrictions: No  ? ? ?  ? ?Mobility Bed Mobility ?Overal bed mobility: Needs Assistance ?Bed Mobility: Supine to Sit, Sit to Supine ?  ?  ?Supine to sit: Supervision, HOB elevated ?  ?  ?  ?  ? ?Transfers ?Overall transfer level: Needs assistance ?Equipment used: 1 person hand held assist, Rolling walker (2 wheels) ?Transfers: Sit to/from Stand, Bed to chair/wheelchair/BSC ?Sit to Stand: Min assist, Min guard ?  ?  ?Step pivot transfers: Min assist, Min guard ?  ?  ?General transfer comment: x1 instance of min a required 2/2 LOB during functional mobility. otherwise close CGA t/o session. ?  ?  ?Balance Overall balance assessment: Needs assistance ?Sitting-balance support: Feet supported, No upper extremity supported ?Sitting balance-Leahy Scale: Good ?  ?  ?Standing balance support: Bilateral upper extremity supported, Single extremity supported, During functional activity, Reliant on assistive device for balance ?Standing balance-Leahy Scale: Poor ?Standing balance comment: x1 instsance of LOB. Safety with standing balance improves with use of RW and BUE support. ?  ?  ?  ?  ?  ?  ?  ?  ?  ?  ?  ?   ? ?  ADL either performed or assessed with clinical judgement  ? ?ADL Overall  ADL's : Needs assistance/impaired ?  ?  ?  ?  ?  ?  ?  ?  ?  ?  ?  ?  ?  ?  ?  ?  ?  ?  ?  ?General ADL Comments: Pt continues to demonstrate decreased safety awarenss, decreased awareness of functional deficits, and decreased balance. MIN A for STS from EOB at start of session. pt able to progress to close CGA for STS/functional mobility during session. She is able to doff/don bilat hospital socks and personal brief with close supervsion and multimodal cueing for safety. Pt noted with mild L inattention during functional mobility with RW requires cueing for reconition of obstacles situated to her left during session. x1 instance of min R lateral LOB 2/2 poor regognition of an obstacle on her L while walking with RW. She requires MIN A to safely mantain balance. ?  ? ?Extremity/Trunk Assessment   ?  ?  ?  ?  ?  ? ?Vision Patient Visual Report: No change from baseline ?Additional Comments: States L in attention is baseline. ?  ?Perception   ?  ?Praxis   ?  ? ?Cognition Arousal/Alertness: Awake/alert ?Behavior During Therapy: [facility] for tasks assessed/performed ?Overall Cognitive Status: No family/caregiver present to determine baseline cognitive functioning ?  ?  ?  ?  ?  ?  ?  ?  ?  ?  ?  ?  ?  ?  ?  ?  ?General Comments: Oriented to self, place, and limited situation. Noted with decreased safety awareness and decreased short-term recall during session. ?  ?  ?   ?Exercises Other Exercises ?Other Exercises: OT facilitated safe transfer to/from [facility], toileting, LB dressing task, and functional moblity in room and hall this date. Pt educated on falls prevention strategies, safe use of AE/DME for ADL management, and routines modifications to support safety and funcitonal independence during ADL management. ? ?  ?Shoulder Instructions   ? ? ?  ?General Comments    ? ? ?Pertinent Vitals/ Pain       Pain Assessment ?Pain Assessment: No/denies pain ? ?Home Living   ?  ?  ?  ?  ?  ?  ?  ?  ?  ?  ?  ?  ?  ?  ?  ?  ?  ?  ? ?   ?Prior Functioning/Environment    ?  ?  ?  ?   ? ?Frequency ? Min 2X/week  ? ? ? ? ?  ?Progress Toward Goals ? ?OT Goals(current goals can now be found in the care plan section) ? Progress towards OT goals: Progressing toward goals ? ?Acute Rehab OT Goals ?Patient Stated Goal: to go home ?OT Goal Formulation: With patient ?Time For Goal Achievement: 09/11/21 ?Potential to Achieve Goals: Good  ?Plan Discharge plan remains appropriate;Frequency remains appropriate   ? ?Co-evaluation ? ? ?   ?  ?  ?  ?  ? ?  ?AM-PAC OT "6 Clicks" Daily Activity     ?Outcome Measure ? ? Help from another person eating meals?: None ?Help from another person taking care of personal grooming?: A Little ?Help from another person toileting, which includes using toliet, bedpan, or urinal?: A Little ?Help from another person bathing (including washing, rinsing, drying)?: A Little ?Help from another person to put on and taking off regular upper body clothing?: A Little ?Help from another person  to put on and taking off regular lower body clothing?: A Little ?6 Click Score: 19 ? ?  ?End of Session Equipment Utilized During Treatment: Gait belt;Rolling walker (2 wheels) ? ?OT Visit Diagnosis: Other abnormalities of gait and mobility (R26.89);History of falling (Z91.81);Muscle weakness (generalized) (M62.81) ?  ?Activity Tolerance Patient tolerated treatment well ?  ?Patient Left with call bell/phone within reach;in chair;with chair alarm set ?  ?Nurse Communication   ?  ? ?   ? ?Time: 1103-1594 ?OT Time Calculation (min): 26 min ? ?Charges: OT General Charges ?$OT Visit: 1 Visit ?OT Treatments ?$Self Care/Home Management : 23-37 mins ? ?Shara Blazing, M.S., OTR/L ?Ascom: [phone] ?08/29/21, 3:24 PM ? ?

## 2021-08-29 NOTE — Procedures (Signed)
Patient Name: Robin Jensen  ?MRN: [id number]  ?Epilepsy Attending: Lora Havens  ?Referring Physician/Provider: Christel Mormon, MD ?Date: 08/29/2021 ?Duration: 28.04 mins ? ?Patient history: 61 year old past history of bipolar disorder, migraine, seizures, history of stroke with no residual deficits, hypertension, hyperlipidemia, hypothyroidism, memory loss, history of frequent falls and a right hand tremor along with prior history of a right ICA aneurysm clipping, patient of control in clinic neurology, presented for evaluation of episode of staring spell, right facial droop and confusion and reports that she has had multiple spells in the last couple of weeks as she has not been generally feeling well. EEG to evaluate for seizure ? ?Level of alertness: Awake, asleep ? ?AEDs during EEG study: LTG, PHT ? ?Technical aspects: This EEG study was done with scalp electrodes positioned according to the 10-20 International system of electrode placement. Electrical activity was acquired at a sampling rate of '500Hz'$  and reviewed with a high frequency filter of '70Hz'$  and a low frequency filter of '1Hz'$ . EEG data were recorded continuously and digitally stored.  ? ?Description: The posterior dominant rhythm consists of 8 Hz activity of moderate voltage (25-35 uV) seen predominantly in posterior head regions, symmetric and reactive to eye opening and eye closing. Sleep was characterized by vertex waves, sleep spindles (12 to 14 Hz), maximal frontocentral region. Hyperventilation and photic stimulation were not performed.    ? ?IMPRESSION: ?This study is within normal limits. No seizures or epileptiform discharges were seen throughout the recording. ? ?Lora Havens  ? ?

## 2021-08-30 DIAGNOSIS — R410 Disorientation, unspecified: Secondary | ICD-10-CM | POA: Diagnosis not present

## 2021-08-30 LAB — CBC WITH DIFFERENTIAL/PLATELET
Abs Immature Granulocytes: 0.02 10*3/uL (ref 0.00–0.07)
Basophils Absolute: 0 10*3/uL (ref 0.0–0.1)
Basophils Relative: 1 %
Eosinophils Absolute: 0.2 10*3/uL (ref 0.0–0.5)
Eosinophils Relative: 2 %
HCT: 35.5 % — ABNORMAL LOW (ref 36.0–46.0)
Hemoglobin: 11.5 g/dL — ABNORMAL LOW (ref 12.0–15.0)
Immature Granulocytes: 0 %
Lymphocytes Relative: 17 %
Lymphs Abs: 1.2 10*3/uL (ref 0.7–4.0)
MCH: 26.9 pg (ref 26.0–34.0)
MCHC: 32.4 g/dL (ref 30.0–36.0)
MCV: 82.9 fL (ref 80.0–100.0)
Monocytes Absolute: 0.5 10*3/uL (ref 0.1–1.0)
Monocytes Relative: 8 %
Neutro Abs: 4.9 10*3/uL (ref 1.7–7.7)
Neutrophils Relative %: 72 %
Platelets: 190 10*3/uL (ref 150–400)
RBC: 4.28 MIL/uL (ref 3.87–5.11)
RDW: 13.6 % (ref 11.5–15.5)
WBC: 6.8 10*3/uL (ref 4.0–10.5)
nRBC: 0 % (ref 0.0–0.2)

## 2021-08-30 LAB — BASIC METABOLIC PANEL
Anion gap: 9 (ref 5–15)
BUN: 12 mg/dL (ref 8–23)
CO2: 23 mmol/L (ref 22–32)
Calcium: 8.7 mg/dL — ABNORMAL LOW (ref 8.9–10.3)
Chloride: 103 mmol/L (ref 98–111)
Creatinine, Ser: 1.1 mg/dL — ABNORMAL HIGH (ref 0.44–1.00)
GFR, Estimated: 57 mL/min — ABNORMAL LOW (ref >60)
Glucose, Bld: 175 mg/dL — ABNORMAL HIGH (ref 70–99)
Potassium: 3.9 mmol/L (ref 3.5–5.1)
Sodium: 135 mmol/L (ref 135–145)

## 2021-08-30 MED ORDER — VERAPAMIL HCL ER 180 MG PO TBCR
180.0000 mg | EXTENDED_RELEASE_TABLET | Freq: Every morning | ORAL | Status: DC
  Administered 2021-08-30 – 2021-08-31 (×2): 180 mg via ORAL
  Filled 2021-08-30 (×2): qty 1

## 2021-08-30 MED ORDER — HYDRALAZINE HCL 50 MG PO TABS
50.0000 mg | ORAL_TABLET | Freq: Three times a day (TID) | ORAL | Status: DC
Start: 2021-08-30 — End: 2021-08-31
  Administered 2021-08-30 (×3): 50 mg via ORAL
  Filled 2021-08-30 (×3): qty 1

## 2021-08-30 MED ORDER — BUTALBITAL-APAP-CAFFEINE 50-325-40 MG PO TABS
1.0000 | ORAL_TABLET | ORAL | Status: DC | PRN
  Administered 2021-08-30 (×2): 1 via ORAL
  Filled 2021-08-30 (×2): qty 1

## 2021-08-30 MED ORDER — HYDRALAZINE HCL 20 MG/ML IJ SOLN
10.0000 mg | INTRAMUSCULAR | Status: DC | PRN
  Administered 2021-08-31: 10 mg via INTRAVENOUS
  Filled 2021-08-30: qty 1

## 2021-08-30 NOTE — Progress Notes (Signed)
Physical Therapy Treatment ?Patient Details ?Name: Robin Jensen ?MRN: [id number] ?DOB: Sep 29, 1960 ?Today's Date: 08/30/2021 ? ? ?History of Present Illness Patient is a 61 year old female with a PMH (+) for bipolar 1 disorder, depression GERD, recurrent UTIs hypertension, anemia, hypothyroidism, migraine, seizure disorder, history of cerebral aneurysm repair who presented to the ED on 08/27/21 with reports of AMS and facial droop. ? ?  ?PT Comments  ? ? Physical Therapy session completed this date. Patient tolerated session well and was agreeable to treatment. Upon entry into room patient was seated in recliner watching TV with no reports of pain. Patient is alert and oriented x3, however continues to demonstrate decreased safety awareness and poor obstacle negotiation. Patient completed multiple sit to stand reps, and standing marches to work on BLE strengthening and balance with RW for increased stability at SUP. Patient then completed 1 lap around the nurses station with RW at [facility] with no LOB, however demonstrated very guarded/ stiff posture, and would bump RW into wall or nurses station. Patient was left in recliner with all needs met/ in reach. Patient is making good progress towards her goals and would continue to benefit from skilled PT services to maximize patient's return to PLOF. Continue to recommend STR upon discharge from acute hospitalization.  ?   ?Recommendations for follow up therapy are one component of a multi-disciplinary discharge planning process, led by the attending physician.  Recommendations may be updated based on patient status, additional functional criteria and insurance authorization. ? ?Follow Up Recommendations ? Skilled nursing-short term rehab (<3 hours/day) ?  ?  ?Assistance Recommended at Discharge Intermittent Supervision/Assistance  ?Patient can return home with the following A little help with walking and/or transfers;Help with stairs or ramp for entrance;Assist for  transportation;Two people to help with bathing/dressing/bathroom ?  ?Equipment Recommendations ? Other (comment) (TBD)  ?  ?Recommendations for Other Services   ? ? ?  ?Precautions / Restrictions Precautions ?Precautions: Fall ?Restrictions ?Weight Bearing Restrictions: No  ?  ? ?Mobility ? Bed Mobility ?  ?  ?  ?  ?  ?  ?  ?General bed mobility comments: Patient started and ended session in recliner ?  ? ?Transfers ?Overall transfer level: Needs assistance ?Equipment used: Rolling walker (2 wheels) ?Transfers: Sit to/from Stand ?Sit to Stand: Supervision (SBA) ?  ?  ?  ?  ?  ?General transfer comment: Additional 15 reps for strengthening ?  ? ?Ambulation/Gait ?Ambulation/Gait assistance: Supervision ?Gait Distance (Feet): 180 Feet ?Assistive device: Rolling walker (2 wheels) ?Gait Pattern/deviations: Step-through pattern, Decreased step length - right, Decreased step length - left, Decreased stride length, Narrow base of support ?Gait velocity: decreased ?  ?  ?General Gait Details: increased guarded posture during ambualtion with RW, poor obstacle negotiation, no LOB noted ? ? ?Stairs ?  ?  ?  ?  ?  ? ? ?Wheelchair Mobility ?  ? ?Modified Rankin (Stroke Patients Only) ?  ? ? ?  ?Balance Overall balance assessment: Needs assistance ?Sitting-balance support: Feet supported, No upper extremity supported ?Sitting balance-Leahy Scale: Good ?  ?  ?Standing balance support: Bilateral upper extremity supported, Single extremity supported, During functional activity, Reliant on assistive device for balance ?Standing balance-Leahy Scale: Fair ?Standing balance comment: SUP with RW ?  ?  ?  ?  ?  ?  ?  ?  ?  ?  ?  ?  ? ?  ?Cognition Arousal/Alertness: Awake/alert ?Behavior During Therapy: [facility] for tasks assessed/performed ?Overall Cognitive Status: No  family/caregiver present to determine baseline cognitive functioning ?  ?  ?  ?  ?  ?  ?  ?  ?  ?  ?  ?  ?  ?  ?  ?  ?General Comments: Oriented to self, place, and limited  situation. Noted with decreased safety awareness ?  ?  ? ?  ?Exercises Other Exercises ?Other Exercises: x17 standing marches bilaterally with RW present for increased stability ? ?  ?General Comments General comments (skin integrity, edema, etc.): BP at beginning of session: 129/31mHg, SpO2 remained >90% throughout session on room air, HR ranged from 69-79bpm throughout sesison ?  ?  ? ?Pertinent Vitals/Pain Pain Assessment ?Pain Assessment: No/denies pain  ? ? ?Home Living   ?  ?  ?  ?  ?  ?  ?  ?  ?  ?   ?  ?Prior Function    ?  ?  ?   ? ?PT Goals (current goals can now be found in the care plan section) Acute Rehab PT Goals ?Patient Stated Goal: to go home ?PT Goal Formulation: With patient ?Time For Goal Achievement: 09/11/21 ?Potential to Achieve Goals: Good ?Progress towards PT goals: Progressing toward goals ? ?  ?Frequency ? ? ? Min 2X/week ? ? ? ?  ?PT Plan Current plan remains appropriate  ? ? ?Co-evaluation   ?  ?  ?  ?  ? ?  ?AM-PAC PT "6 Clicks" Mobility   ?Outcome Measure ? Help needed turning from your back to your side while in a flat bed without using bedrails?: None ?Help needed moving from lying on your back to sitting on the side of a flat bed without using bedrails?: None ?Help needed moving to and from a bed to a chair (including a wheelchair)?: A Little ?Help needed standing up from a chair using your arms (e.g., wheelchair or bedside chair)?: A Little ?Help needed to walk in hospital room?: A Little ?Help needed climbing 3-5 steps with a railing? : A Little ?6 Click Score: 20 ? ?  ?End of Session Equipment Utilized During Treatment: Gait belt ?Activity Tolerance: Patient tolerated treatment well ?Patient left: in chair;with call bell/phone within reach;with chair alarm set ?Nurse Communication: Mobility status ?PT Visit Diagnosis: Unsteadiness on feet (R26.81);Muscle weakness (generalized) (M62.81) ?  ? ? ?Time: [address]?PT Time Calculation (min) (ACUTE ONLY): 18 min ? ?Charges:  $Gait  Training: 8-22 mins          ?          ?CIva Boop PT  ?08/30/21. 11:39 AM ? ? ?

## 2021-08-30 NOTE — Plan of Care (Signed)
  Problem: Education: Goal: Knowledge of disease or condition will improve Outcome: Progressing Goal: Knowledge of secondary prevention will improve (SELECT ALL) Outcome: Progressing Goal: Knowledge of patient specific risk factors will improve (INDIVIDUALIZE FOR PATIENT) Outcome: Progressing Goal: Individualized Educational Video(s) Outcome: Progressing   

## 2021-08-30 NOTE — TOC Initial Note (Signed)
Transition of Care (TOC) - Initial/Assessment Note  ? ? ?Patient Details  ?Name: Robin Jensen ?MRN: [id number] ?Date of Birth: October 19, 1960 ? ?Transition of Care (TOC) CM/SW Contact:    ?Pete Pelt, RN ?Phone Number: ?08/30/2021, 2:27 PM ? ?Clinical Narrative:    patient lives at home with boyfriend, declines SNF says she has all she needs for DME. [facility] to accept.    Anticipated Dc tomorrow.         ? ? ?Expected Discharge Plan: [place] ?Barriers to Discharge: Continued Medical Work up ? ? ?Patient Goals and CMS Choice ?  ?  ?  ? ?Expected Discharge Plan and Services ?Expected Discharge Plan: [place] ?  ?Discharge Planning Services: CM Consult ?  ?Living arrangements for the past 2 months: [place] ?                ?DME Arranged:  (patient states she has all DME) ?  ?  ?  ?  ?HH Arranged: PT ?[place] Agency: [place] ([place]) ?Date HH Agency Contacted: 08/30/21 ?  ?Representative spoke with at [place]: Corene Cornea ? ?Prior Living Arrangements/Services ?Living arrangements for the past 2 months: [place] ?Lives with:: Self, Significant Other ?Patient language and need for interpreter reviewed:: Yes ?Do you feel safe going back to the place where you live?: Yes      ?Need for Family Participation in Patient Care: Yes (Comment) ?Care giver support system in place?: Yes (comment) ?  ?Criminal Activity/Legal Involvement Pertinent to Current Situation/Hospitalization: No - Comment as needed ? ?Activities of Daily Living ?Home Assistive Devices/Equipment: Gilford Rile (specify type) ?ADL Screening (condition at time of admission) ?Patient's cognitive ability adequate to safely complete daily activities?: Yes ?Is the patient deaf or have difficulty hearing?: No ?Does the patient have difficulty seeing, even when wearing glasses/contacts?: No ?Does the patient have difficulty concentrating, remembering, or making decisions?: Yes ?Patient able to  express need for assistance with ADLs?: Yes ?Does the patient have difficulty dressing or bathing?: No ?Independently performs ADLs?: Yes (appropriate for developmental age) ?Does the patient have difficulty walking or climbing stairs?: Yes ?Weakness of Legs: Both ?Weakness of Arms/Hands: None ? ?Permission Sought/Granted ?Permission sought to share information with : Case Manager ?Permission granted to share information with : Yes, Verbal Permission Granted ?   ? Permission granted to share info w AGENCY: [facility] ?   ?   ? ?Emotional Assessment ?Appearance:: Appears stated age ?Attitude/Demeanor/Rapport: Gracious ?Affect (typically observed): Pleasant ?  ?Alcohol / Substance Use: Not Applicable ?Psych Involvement: No (comment) ? ?Admission diagnosis:  Altered mental status [R41.82] ?Facial droop [R29.810] ?Acute cystitis with hematuria [N30.01] ?Altered mental status, unspecified altered mental status type [R41.82] ?Acute nonintractable headache, unspecified headache type [R51.9] ?AMS (altered mental status) [R41.82] ?Patient Active Problem List  ? Diagnosis Date Noted  ? AMS (altered mental status) 08/28/2021  ? Altered mental status 08/27/2021  ? Seizure disorder ([place]) 08/27/2021  ? Anxiety and depression 08/27/2021  ? Dyslipidemia 08/27/2021  ? Acute lower urinary tract infection 08/27/2021  ? Hypokalemia 08/27/2021  ? AKI (acute kidney injury) ([place]) 08/27/2021  ? Hyponatremia 08/27/2021  ? Hypocalcemia 12/24/2020  ? Dizziness 11/20/2017  ? Ataxia 10/04/2017  ? Headache disorder 06/12/2017  ? Loss of memory 06/12/2017  ? 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 Borderline personality  disorder ([place]) 01/28/2016  ? Falls 01/28/2016  ? Panic disorder 01/28/2016  ? Schizoaffective disorder, bipolar type ([place]) 01/28/2016  ? Spells of decreased attentiveness 01/28/2016  ? Cervical cancer ([place])  05/06/2013  ? Aneurysm, cerebral, nonruptured 08/14/2012  ? Hypothyroidism 08/14/2012  ? Other and unspecified hyperlipidemia 08/14/2012  ? Seizures ([place]) 08/14/2012  ? Insomnia 08/14/2012  ? Unspecified constipation 08/07/2012  ? FHx: colon cancer 08/07/2012  ? Right sided abdominal pain 08/07/2012  ? Essential hypertension 08/07/2012  ? Abnormal LFTs 08/07/2012  ? ?PCP:  Donnie Coffin, MD ?Pharmacy:   ?[id number] [place] - [address] ?[address] ?[id number]?Phone: [phone]: [phone]? ? ? ? ?Social Determinants of Health (SDOH) Interventions ?  ? ?Readmission Risk Interventions ?   ? View : No data to display.  ?  ?  ?  ? ? ? ?

## 2021-08-30 NOTE — Progress Notes (Signed)
?   08/30/21 0747  ?Assess: MEWS Score  ?Temp 98 ?F (36.7 ?C)  ?BP (!) 209/107  ?Pulse Rate 72  ?Resp 14  ?SpO2 97 %  ?O2 Device Room Air  ?Assess: MEWS Score  ?MEWS Temp 0  ?MEWS Systolic 2  ?MEWS Pulse 0  ?MEWS RR 0  ?MEWS LOC 0  ?MEWS Score 2  ?MEWS Score Color Yellow  ?Assess: if the MEWS score is Yellow or Red  ?Were vital signs taken at a resting state? Yes  ?Focused Assessment No change from prior assessment  ?Does the patient meet 2 or more of the SIRS criteria? No  ?MEWS guidelines implemented *See Row Information* Yes  ?Treat  ?MEWS Interventions Administered scheduled meds/treatments  ?Pain Scale 0-10  ?Pain Score 6  ?Pain Type Acute pain  ?Pain Location Head  ?Patients Stated Pain Goal 0  ?Pain Intervention(s) Medication (See eMAR)  ?Take Vital Signs  ?Increase Vital Sign Frequency  Yellow: Q 2hr X 2 then Q 4hr X 2, if remains yellow, continue Q 4hrs  ?Escalate  ?MEWS: Escalate Yellow: discuss with charge nurse/RN and consider discussing with provider and RRT  ?Notify: Charge Nurse/RN  ?Name of Charge Nurse/RN Notified Estill Bamberg RN  ?Date Charge Nurse/RN Notified 08/30/21  ?Time Charge Nurse/RN Notified [phone]  ?Notify: Provider  ?Provider Name/Title Dr Priscella Mann  ?Date Provider Notified 08/30/21  ?Time Provider Notified [phone]  ?Method of Notification Face-to-face  ?Notification Reason Other (Comment) ?(notification)  ?Provider response No new orders  ?Date of Provider Response 08/30/21  ?Time of Provider Response [phone]  ?Assess: SIRS CRITERIA  ?SIRS Temperature  0  ?SIRS Pulse 0  ?SIRS Respirations  0  ?SIRS WBC 0  ?SIRS Score Sum  0  ? ?

## 2021-08-30 NOTE — Progress Notes (Addendum)
?PROGRESS NOTE ? ? ? ?Robin Jensen  KDX:[id number] DOB: 04-14-1961 DOA: 08/27/2021 ?PCP: Donnie Coffin, MD  ? ? ?Brief Narrative:  ?61 y.o. female with medical history significant for bipolar 1 disorder, depression GERD, recurrent UTIs hypertension, anemia, hypothyroidism, migraine, seizure disorder, history of cerebral aneurysm repair, who presented to the emergency room with acute onset of intermittent altered mental status with associated altered left facial droop with lightheadedness.  She reportedly had a fall with brief loss of consciousness.  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  Facial droop and dysarthria lasted about 5 minutes and resolved by arrival EMS.  She had a reported episode where her body went stiff and she stared straight forward and could not speak or interact with EMS staff.  Her symptoms resolved within 30 seconds after which the patient did not remember this event.  She denies any current paresthesias or new focal muscle weakness.  She admits to urinary frequency and urgency without dysuria, oliguria, hematuria or flank pain.  No fever or chills.  No neck pain or stiffness.  No chest pain or palpitations.  No nausea or vomiting or abdominal pain.  No tinnitus or vertigo.  No cough or wheezing or dyspnea. ? ?MRI negative.  Discussed with neurology.  Status post spot EEG 5/8.  Negative for epileptiform activity.  No fevers over interval.  Blood pressure remains markedly elevated despite escalation of oral regimen. ? ? ?Assessment & Plan: ?  ?Principal Problem: ?  Altered mental status ?Active Problems: ?  Seizure disorder ([place]) ?  Acute lower urinary tract infection ?  Hypokalemia ?  AKI (acute kidney injury) ([place]) ?  Essential hypertension ?  Hyponatremia ?  Hypothyroidism ?  Schizoaffective disorder, bipolar type ([place]) ?  Anxiety and depression ?  Dyslipidemia ?  AMS (altered mental status) ? ?Hypertensive urgency ?Patient with refractory  hypertension on multiple agents ?Plan: ?Continue clonidine 0.1 mg twice daily ?Continue hydralazine, dose increased to 50 mg 3 times daily ?Continue labetalol 300 mg twice daily ?Continue Cozaar 100 mg daily ?Continue verapamil dose increased to 180 mg daily ?IV hydralazine 10 mg every 4 hours as needed ? ?Altered mentation ?Acute metabolic encephalopathy ?TIA remains on differential however MRI negative.   ?Suspecting metabolic encephalopathy secondary to UTI ?Mental status improving over interval ?Spot EEG negative ?Plan: ?Treatment for UTI with IV Rocephin, 3 days course, today 3/3 ?PT OT ST consults, recommendation for skilled nursing facility.  Patient not in agreement ? ?Seizure disorder ?Continue home regimen of Valium, phenytoin, Neurontin ?IV Ativan as needed ?Check phenytoin level, reassuring ? ?Acute cystitis ?Likely contributing to metabolic encephalopathy ?Treatment with IV Rocephin, last dose 5/9 ?Monitor vitals and fever curve ? ?Hypokalemia ?Monitor and replace potassium as necessary ? ?Acute kidney injury ?Suspect prerenal azotemia ?Hypertensive nephropathy also on differential ?Improving over interval ?Plan: ?Hold IV fluids for now ?Monitor kidney function ?Avoid nephrotoxins ? ?Hyponatremia ?Suspect intravascular volume depletion ?Check BMP.  Hold IV fluids for now ? ? ?Hypothyroidism ?PTA Synthroid ? ?Schizoaffective disorder ?Bipolar 1 disorder ?PTA Ingrezza, Risperdal, Cogentin ? ?Hyperlipidemia ?PTA statin ? ?Anxiety ?Depression ?PTA BuSpar, Valium, Pristiq ? ? ? ?DVT prophylaxis: SQ Lovenox ?Code Status: Full ?Family Communication: Patient's boyfriend [name] [phone] via phone on 5/9 ?Disposition Plan: Status is: Inpatient ?Remains inpatient appropriate because: UTI on IV antibiotics.  Completing course today.  Now with hypertensive urgency refractory to multiple agents ? ? ? ? ? ? ?Level of care: Telemetry Medical ? ?Consultants:  ?  Neurology ? ?Procedures:   ?None ? ?Antimicrobials: ?Ceftriaxone ? ? ?Subjective: ?Seen and examined.  Resting comfortably in bed.  Complains of headache.  Concerned about blood pressure ? ?Objective: ?Vitals:  ? 08/29/21 5397 08/29/21 2010 08/30/21 6734 08/30/21 0747  ?BP: (!) 194/106 (!) 185/106 (!) 184/99 (!) 209/107  ?Pulse: 80 81 72 72  ?Resp: '16 16 18 14  '$ ?Temp: 98.1 ?F (36.7 ?C) 98.4 ?F (36.9 ?C) 97.8 ?F (36.6 ?C) 98 ?F (36.7 ?C)  ?TempSrc: Oral Oral Oral   ?SpO2: 97% 98% 96% 97%  ?Weight:      ?Height:      ? ? ?Intake/Output Summary (Last 24 hours) at 08/30/2021 1003 ?Last data filed at 08/30/2021 0537 ?Gross per 24 hour  ?Intake 180 ml  ?Output 1450 ml  ?Net -1270 ml  ? ?Filed Weights  ? 08/27/21 2004 08/27/21 2044  ?Weight: 83.5 kg 81.6 kg  ? ? ?Examination: ? ?General exam: No acute distress.  Flattened affect.  Mildly tremulous ?Respiratory system: Lungs clear.  Normal work of breathing.  Room air ?Cardiovascular system: S1-S2, RRR, no murmurs, no pedal edema ?Gastrointestinal system: Soft, NT/ND, normal bowel sounds ?Central nervous system: Alert and oriented. No focal neurological deficits.  Resting tremor  ?Extremities: Symmetric 5 x 5 power. ?Skin: No rashes, lesions or ulcers ?Psychiatry: Judgement and insight appear normal. Mood & affect anxious.  ? ? ? ?Data Reviewed: I have personally reviewed following labs and imaging studies ? ?CBC: ?Recent Labs  ?Lab 08/27/21 ?1649 08/28/21 ?1937  ?WBC 12.0* 9.1  ?NEUTROABS 8.7*  --   ?HGB 11.0* 10.8*  ?HCT 33.4* 33.5*  ?MCV 83.7 84.6  ?PLT 147* 130*  ? ?Basic Metabolic Panel: ?Recent Labs  ?Lab 08/27/21 ?1649 08/28/21 ?9024  ?NA 133* 134*  ?K 3.3* 3.8  ?CL 98 104  ?CO2 25 22  ?GLUCOSE 90 99  ?BUN 14 10  ?CREATININE 1.35* 1.13*  ?CALCIUM 8.7* 8.3*  ?MG 2.1  --   ? ?GFR: ?Estimated Creatinine Clearance: 50.6 mL/min (A) (by C-G formula based on SCr of 1.13 mg/dL (H)). ?Liver Function Tests: ?Recent Labs  ?Lab 08/27/21 ?1649 08/28/21 ?0973  ?AST 38 39  ?ALT 26 24  ?ALKPHOS 81 78  ?BILITOT  0.8 0.6  ?PROT 6.3* 6.2*  ?ALBUMIN 3.2* 3.0*  ? ?No results for input(s): LIPASE, AMYLASE in the last 168 hours. ?No results for input(s): AMMONIA in the last 168 hours. ?Coagulation Profile: ?Recent Labs  ?Lab 08/27/21 ?1649  ?INR 1.0  ? ?Cardiac Enzymes: ?No results for input(s): CKTOTAL, CKMB, CKMBINDEX, TROPONINI in the last 168 hours. ?BNP (last 3 results) ?No results for input(s): PROBNP in the last 8760 hours. ?HbA1C: ?Recent Labs  ?  08/27/21 ?1649  ?HGBA1C 5.5  ? ?CBG: ?Recent Labs  ?Lab 08/27/21 ?1647  ?GLUCAP 92  ? ?Lipid Profile: ?Recent Labs  ?  08/28/21 ?5329  ?CHOL 124  ?HDL 35*  ?[place] 60  ?TRIG 146  ?CHOLHDL 3.5  ? ?Thyroid Function Tests: ?Recent Labs  ?  08/27/21 ?2235  ?TSH 2.791  ? ?Anemia Panel: ?No results for input(s): VITAMINB12, FOLATE, FERRITIN, TIBC, IRON, RETICCTPCT in the last 72 hours. ?Sepsis Labs: ?No results for input(s): PROCALCITON, LATICACIDVEN in the last 168 hours. ? ?No results found for this or any previous visit (from the past 240 hour(s)).  ? ? ? ? ? ?Radiology Studies: ?EEG adult ? ?Result Date: 08/29/2021 ?Lora Havens, MD     08/29/2021  5:45 PM Patient Name: AURORE REDINGER MRN:  [id number] Epilepsy Attending: Lora Havens Referring Physician/Provider: Christel Mormon, MD Date: 08/29/2021 Duration: 28.04 mins Patient history: 61 year old past history of bipolar disorder, migraine, seizures, history of stroke with no residual deficits, hypertension, hyperlipidemia, hypothyroidism, memory loss, history of frequent falls and a right hand tremor along with prior history of a right ICA aneurysm clipping, patient of control in clinic neurology, presented for evaluation of episode of staring spell, right facial droop and confusion and reports that she has had multiple spells in the last couple of weeks as she has not been generally feeling well. EEG to evaluate for seizure Level of alertness: Awake, asleep AEDs during EEG study: LTG, PHT Technical aspects: This EEG study  was done with scalp electrodes positioned according to the 10-20 International system of electrode placement. Electrical activity was acquired at a sampling rate of '500Hz'$  and reviewed with a high frequency filter of '70Hz'$  and a low freque

## 2021-08-31 MED ORDER — LABETALOL HCL 300 MG PO TABS: 300.0000 mg | ORAL_TABLET | Freq: Two times a day (BID) | ORAL | 2 refills | Status: AC

## 2021-08-31 MED ORDER — VERAPAMIL HCL ER 120 MG PO TBCR: 120.0000 mg | EXTENDED_RELEASE_TABLET | Freq: Every morning | ORAL | 2 refills | Status: DC

## 2021-08-31 MED ORDER — HYDRALAZINE HCL 50 MG PO TABS
100.0000 mg | ORAL_TABLET | Freq: Three times a day (TID) | ORAL | Status: DC
  Administered 2021-08-31: 100 mg via ORAL
  Filled 2021-08-31: qty 2

## 2021-08-31 MED ORDER — LOSARTAN POTASSIUM 100 MG PO TABS: 100.0000 mg | ORAL_TABLET | Freq: Every day | ORAL | 2 refills | Status: DC

## 2021-08-31 MED ORDER — HYDRALAZINE HCL 100 MG PO TABS: 100.0000 mg | ORAL_TABLET | Freq: Three times a day (TID) | ORAL | 2 refills | Status: AC

## 2021-08-31 NOTE — Care Management Important Message (Signed)
Important Message ? ?Patient Details  ?Name: Robin Jensen ?MRN: [id number] ?Date of Birth: 11-Mar-1961 ? ? ?Medicare Important Message Given:  Yes ? ? ? ? ?Juliann Pulse A Dara Beidleman ?08/31/2021, 1:30 PM ?

## 2021-08-31 NOTE — Progress Notes (Signed)
Physical Therapy Treatment ?Patient Details ?Name: Robin Jensen ?MRN: [id number] ?DOB: [birth year] ?Today's Date: 08/31/2021 ? ? ?History of Present Illness Patient is a 61 year old female with a PMH (+) for bipolar 1 disorder, depression GERD, recurrent UTIs hypertension, anemia, hypothyroidism, migraine, seizure disorder, history of cerebral aneurysm repair who presented to the ED on 08/27/21 with reports of AMS and facial droop. ? ?  ?PT Comments  ? ? Physical Therapy session completed this date. Patient tolerated session well and was agreeable to treatment. Upon entry into room patient was laying in bed watching TV with no pain reported. Patient's session focused on BLE strengthening through multiple therapeutic exercises, balance, and activity tolerance. Patient was able to perform all therapeutic exercises listed below at [facility] with cueing on proper technique. Patient also demonstrated increased ambulation distance, completing ~320 feet with No AD at SBA/SUP. No LOB noted, however patient continues to demonstrate increased guarded posture, decreased reciprocal arm swing, and poor safety awareness during ambulation. Patient is progressing towards her goals, however would continue to benefit rfom skilled physical therapy in order to optimize patient's return to PLOF. Continue to recommend STR upon discharge from acute hospitalization. ?    ?Recommendations for follow up therapy are one component of a multi-disciplinary discharge planning process, led by the attending physician.  Recommendations may be updated based on patient status, additional functional criteria and insurance authorization. ? ?Follow Up Recommendations ? Skilled nursing-short term rehab (<3 hours/day) ?  ?  ?Assistance Recommended at Discharge Intermittent Supervision/Assistance  ?Patient can return home with the following A little help with walking and/or transfers;Help with stairs or ramp for entrance;Assist for transportation;Two people to  help with bathing/dressing/bathroom ?  ?Equipment Recommendations ? Other (comment) (TBD)  ?  ?Recommendations for Other Services   ? ? ?  ?Precautions / Restrictions Precautions ?Precautions: Fall ?Restrictions ?Weight Bearing Restrictions: No  ?  ? ?Mobility ? Bed Mobility ?Overal bed mobility: Modified Independent ?Bed Mobility: Supine to Sit, Sit to Supine ?  ?  ?Supine to sit: Modified independent (Device/Increase time) ?Sit to supine: Modified independent (Device/Increase time) ?  ?  ?  ? ?Transfers ?Overall transfer level: Needs assistance ?Equipment used: None ?Transfers: Sit to/from Stand ?Sit to Stand: Supervision ?  ?  ?  ?  ?  ?General transfer comment: slight unsteadiness upon standing ?  ? ?Ambulation/Gait ?Ambulation/Gait assistance:  (SBA) ?Gait Distance (Feet): 320 Feet ?Assistive device: None ?Gait Pattern/deviations: Step-through pattern, Decreased step length - right, Decreased step length - left, Decreased stride length, Narrow base of support ?Gait velocity: decreased ?  ?  ?General Gait Details: increased guarded posture during ambualtion, decreased recirpical arm swing bilaterally, poor obstacle negotiation, no LOB noted, would intermittently use hand rail for increased stability during ambulation ? ? ?Stairs ?  ?  ?  ?  ?  ? ? ?Wheelchair Mobility ?  ? ?Modified Rankin (Stroke Patients Only) ?  ? ? ?  ?Balance Overall balance assessment: Needs assistance ?Sitting-balance support: Feet supported, No upper extremity supported ?Sitting balance-Leahy Scale: Good ?  ?  ?Standing balance support: Bilateral upper extremity supported, Single extremity supported, During functional activity, Reliant on assistive device for balance ?Standing balance-Leahy Scale: Fair ?Standing balance comment: SUP with RW ?  ?  ?  ?  ?  ?  ?  ?  ?  ?  ?  ?  ? ?  ?Cognition Arousal/Alertness: Awake/alert ?Behavior During Therapy: [facility] for tasks assessed/performed ?Overall Cognitive Status: No family/caregiver present  to  determine baseline cognitive functioning ?  ?  ?  ?  ?  ?  ?  ?  ?  ?  ?  ?  ?  ?  ?  ?  ?General Comments: Oriented to self, place, and limited situation. Noted with decreased safety awareness ?  ?  ? ?  ?Exercises General Exercises - Lower Extremity ?Ankle Circles/Pumps: Standing, 20 reps, AROM, Both ?Hip ABduction/ADduction: Standing, 20 reps, AROM, Both ?Straight Leg Raises: Supine, 15 reps, AROM, Both ? ?  ?General Comments   ?  ?  ? ?Pertinent Vitals/Pain Pain Assessment ?Pain Assessment: No/denies pain  ? ? ?Home Living   ?  ?  ?  ?  ?  ?  ?  ?  ?  ?   ?  ?Prior Function    ?  ?  ?   ? ?PT Goals (current goals can now be found in the care plan section) Acute Rehab PT Goals ?Patient Stated Goal: to go home ?PT Goal Formulation: With patient ?Time For Goal Achievement: 09/11/21 ?Potential to Achieve Goals: Good ?Progress towards PT goals: Progressing toward goals ? ?  ?Frequency ? ? ? Min 2X/week ? ? ? ?  ?PT Plan Current plan remains appropriate  ? ? ?Co-evaluation   ?  ?  ?  ?  ? ?  ?AM-PAC PT "6 Clicks" Mobility   ?Outcome Measure ? Help needed turning from your back to your side while in a flat bed without using bedrails?: None ?Help needed moving from lying on your back to sitting on the side of a flat bed without using bedrails?: None ?Help needed moving to and from a bed to a chair (including a wheelchair)?: A Little ?Help needed standing up from a chair using your arms (e.g., wheelchair or bedside chair)?: A Little ?Help needed to walk in hospital room?: A Little ?Help needed climbing 3-5 steps with a railing? : A Little ?6 Click Score: 20 ? ?  ?End of Session Equipment Utilized During Treatment: Gait belt ?Activity Tolerance: Patient tolerated treatment well ?Patient left: in bed;with call bell/phone within reach ?Nurse Communication: Mobility status ?PT Visit Diagnosis: Unsteadiness on feet (R26.81);Muscle weakness (generalized) (M62.81) ?  ? ? ?Time: 9702-6378 ?PT Time Calculation (min) (ACUTE ONLY):  13 min ? ?Charges:  $Therapeutic Exercise: 8-22 mins          ?          ? ?Iva Boop, PT  ?08/31/21. 11:55 AM ? ? ?

## 2021-08-31 NOTE — Discharge Summary (Signed)
? ?Physician Discharge Summary ? ? ?Robin Jensen  female DOB: 11/23/1960  ?IDP:[id number] ? ?PCP: Donnie Coffin, MD ? ?Admit date: 08/27/2021 ?Discharge date: 08/31/2021 ? ?Admitted From: home ?Disposition:  home.  Pt declined SNF. ?Home Health: Yes ?CODE STATUS: Full code ? ?Discharge Instructions   ? ? Discharge instructions   Complete by: As directed ?  ? I have increased your home hydralazine from 25 mg to 100 mg 3 times a day.  I have refilled the rest of your home blood pressure medications.  Please take them as directed. ? ? ?Dr. Enzo Bi ?- ?-  ? ?  ? ?Hospital Course:  ?For full details, please see H&P, progress notes, consult notes and ancillary notes.  ?Briefly,  ?Robin Jensen is a 61 y.o. female with medical history significant for bipolar 1 disorder, depression, recurrent UTIs hypertension, migraine, seizure disorder, history of cerebral aneurysm repair, who presented to the emergency room with acute onset of intermittent altered mental status with associated altered left facial droop with lightheadedness.  She reportedly had a fall with brief loss of consciousness.  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  Facial droop and dysarthria lasted about 5 minutes and resolved by arrival EMS.  She had a reported episode where her body went stiff and she stared straight forward and could not speak or interact with EMS staff.  Her symptoms resolved within 30 seconds after which the patient did not remember this event.  She denies any current paresthesias or new focal muscle weakness.  She admits to urinary frequency and urgency without dysuria, oliguria, hematuria or flank pain.  ?  ?MRI negative.  Discussed with neurology.  Status post spot EEG 5/8.  Negative for epileptiform activity.  Blood pressure remains markedly elevated despite escalation of oral regimen. ? ?Hypertensive urgency ?BP remained elevated on home regimen, which pt also noted at home. ?Continue  home labetalol 300 mg twice daily ?Continue home Cozaar 100 mg daily ?Continue home verapamil 120 mg daily ?--increase home hydralazine from 25 mg to 100 mg TID. ?--systolic 443'X with above regimen.  Pt will follow up with PCP for further BP medication adjustment. ? ?Acute metabolic encephalopathy ?TIA remains on differential however MRI negative.   ?Suspecting metabolic encephalopathy secondary to UTI ?Mental status improving over interval ?Spot EEG negative ?  ?Seizure disorder ?phenytoin level within range. ?Continue home regimen of Valium, phenytoin, Neurontin ?  ?Acute cystitis ?--urine cx was not obtained on presentation.  pt received ceftriaxone x4 days for empiric tx. ?  ?Hypokalemia ?Monitored and replaced potassium as necessary ?  ?Acute kidney injury ?--Cr 1.35 on presentation.  Baseline around 1.  Improved to 1.1 prior to discharge. ?  ?Hyponatremia ?--mild, not clinically significant ?  ?Hypothyroidism ?Cont home Synthroid ?  ?Schizoaffective disorder ?Bipolar 1 disorder ?Cont home Ingrezza, Risperdal, Cogentin and Lamictal ?  ?Hyperlipidemia ?Cont home statin ?  ?Anxiety ?Depression ?Cont home BuSpar, Valium, Pristiq ? ? ?Discharge Diagnoses:  ?Principal Problem: ?  Altered mental status ?Active Problems: ?  Seizure disorder ([place]) ?  Acute lower urinary tract infection ?  Hypokalemia ?  AKI (acute kidney injury) ([place]) ?  Essential hypertension ?  Hyponatremia ?  Hypothyroidism ?  Schizoaffective disorder, bipolar type ([place]) ?  Anxiety and depression ?  Dyslipidemia ?  AMS (altered mental status) ? ? ?30 Day Unplanned Readmission Risk Score   ? ?Flowsheet Row ED to Hosp-Admission (Current) from 08/27/2021 in [place] 1C  MEDICAL TELEMETRY  ?30 Day Unplanned Readmission Risk Score (%) 23.81 Filed at 08/31/2021 1200  ? ?  ? ? This score is the patient's risk of an unplanned readmission within 30 days of being discharged (0 -100%). The score is based on dignosis, age, lab data,  medications, orders, and past utilization.   ?Low:  0-14.9   Medium: 15-21.9   High: 22-29.9   Extreme: 30 and above ? ?  ? ?  ? ? ?Discharge Instructions: ? ?Allergies as of 08/31/2021   ? ?   Reactions  ? Bee Venom Anaphylaxis  ? Penicillins Shortness Of Breath, Rash  ? Latex   ? Lisinopril   ? Other   ? Sulfa Antibiotics   ? ?  ? ?  ?Medication List  ?  ? ?STOP taking these medications   ? ?meloxicam 7.5 MG tablet ?Commonly known as: MOBIC ?  ? ?  ? ?TAKE these medications   ? ?atorvastatin 40 MG tablet ?Commonly known as: LIPITOR ?Take 40 mg by mouth at bedtime. ?  ?benztropine 2 MG tablet ?Commonly known as: COGENTIN ?Take 2 mg by mouth daily. ?  ?busPIRone 10 MG tablet ?Commonly known as: BUSPAR ?Take 10 mg by mouth 3 (three) times daily. ?  ?cyclobenzaprine 10 MG tablet ?Commonly known as: FLEXERIL ?Take 10 mg by mouth every 8 (eight) hours as needed for muscle spasms. ?  ?desvenlafaxine 100 MG 24 hr tablet ?Commonly known as: PRISTIQ ?Take 150 mg by mouth at bedtime. ?  ?desvenlafaxine 50 MG 24 hr tablet ?Commonly known as: PRISTIQ ?Take 50 mg by mouth at bedtime. ?  ?diazepam 5 MG tablet ?Commonly known as: VALIUM ?Take 5 mg by mouth at bedtime. ?  ?donepezil 10 MG tablet ?Commonly known as: ARICEPT ?Take 10 mg by mouth at bedtime. ?  ?Emgality 120 MG/ML Sosy ?Generic drug: Galcanezumab-gnlm ?Inject 120 mg into the skin every 28 (twenty-eight) days. ?  ?EPINEPHrine 0.3 mg/0.3 mL Soaj injection ?Commonly known as: EPI-PEN ?Inject 0.3 mg into the muscle as needed. For anaphylactic reaction ?  ?gabapentin 300 MG capsule ?Commonly known as: NEURONTIN ?Take 300 mg by mouth at bedtime. ?  ?hydrALAZINE 100 MG tablet ?Commonly known as: APRESOLINE ?Take 1 tablet (100 mg total) by mouth 3 (three) times daily. ?What changed:  ?medication strength ?how much to take ?  ?Ingrezza 80 MG capsule ?Generic drug: valbenazine ?Take 80 mg by mouth at bedtime. ?  ?Kirt Boys 234 MG/1.5ML injection ?Generic drug:  paliperidone ?Inject 234 mg into the muscle every 30 (thirty) days. ?  ?labetalol 300 MG tablet ?Commonly known as: NORMODYNE ?Take 1 tablet (300 mg total) by mouth 2 (two) times daily. ?  ?lamoTRIgine 200 MG tablet ?Commonly known as: LAMICTAL ?Take 200 mg by mouth daily. ?  ?levothyroxine 75 MCG tablet ?Commonly known as: SYNTHROID ?Take 1 tablet (75 mcg total) by mouth daily before breakfast. ?  ?linaclotide 72 MCG capsule ?Commonly known as: LINZESS ?Take 72 mcg by mouth daily before breakfast. ?  ?losartan 100 MG tablet ?Commonly known as: COZAAR ?Take 1 tablet (100 mg total) by mouth daily. ?  ?pantoprazole 40 MG tablet ?Commonly known as: PROTONIX ?Take 1 tablet (40 mg total) by mouth 2 (two) times daily. ?  ?phenytoin 200 MG ER capsule ?Commonly known as: DILANTIN ?Take 400 mg by mouth daily. ?  ?risperiDONE 0.5 MG tablet ?Commonly known as: RISPERDAL ?Take 0.5 mg by mouth daily. ?  ?verapamil 120 MG CR tablet ?Commonly known as: CALAN-SR ?Take  1 tablet (120 mg total) by mouth every morning. ?  ? ?  ? ? ? Follow-up Information   ? ? Aycock, Ngwe A, MD Follow up in 1 week(s).   ?Specialty: Family Medicine ?Contact information: ?[address] ?[place] [address] ?[phone] ? ? ?  ?  ? ?  ?  ? ?  ? ? ?Allergies  ?Allergen Reactions  ? Bee Venom Anaphylaxis  ? Penicillins Shortness Of Breath and Rash  ? Latex   ? Lisinopril   ? Other   ? Sulfa Antibiotics   ? ? ? ?The results of significant diagnostics from this hospitalization (including imaging, microbiology, ancillary and laboratory) are listed below for reference.  ? ?Consultations: ? ? ?Procedures/Studies: ?CT HEAD WO CONTRAST ? ?Result Date: 08/27/2021 ?CLINICAL DATA:  Transient ischemic attack. EXAM: CT HEAD WITHOUT CONTRAST TECHNIQUE: Contiguous axial images were obtained from the base of the skull through the vertex without intravenous contrast. RADIATION DOSE REDUCTION: This exam was performed according to the departmental  dose-optimization program which includes automated exposure control, adjustment of the mA and/or kV according to patient size and/or use of iterative reconstruction technique. COMPARISON:  December 24, 2020 FINDINGS: Brain: No eviden

## 2021-10-16 ENCOUNTER — Emergency Department: Payer: Medicare Other

## 2021-10-16 ENCOUNTER — Inpatient Hospital Stay
Admission: EM | Admit: 2021-10-16 | Discharge: 2021-10-20 | DRG: 312 | Disposition: A | Discharge status: Home / Self Care | Payer: Medicare Other | Attending: Internal Medicine | Admitting: Internal Medicine

## 2021-10-16 ENCOUNTER — Other Ambulatory Visit: Payer: Self-pay

## 2021-10-16 DIAGNOSIS — Z79899 Other long term (current) drug therapy: Secondary | ICD-10-CM | POA: Diagnosis not present

## 2021-10-16 DIAGNOSIS — F489 Nonpsychotic mental disorder, unspecified: Secondary | ICD-10-CM | POA: Diagnosis not present

## 2021-10-16 DIAGNOSIS — E669 Obesity, unspecified: Secondary | ICD-10-CM | POA: Diagnosis present

## 2021-10-16 DIAGNOSIS — G909 Disorder of the autonomic nervous system, unspecified: Secondary | ICD-10-CM | POA: Diagnosis present

## 2021-10-16 DIAGNOSIS — Z8261 Family history of arthritis: Secondary | ICD-10-CM

## 2021-10-16 DIAGNOSIS — G43909 Migraine, unspecified, not intractable, without status migrainosus: Secondary | ICD-10-CM | POA: Diagnosis present

## 2021-10-16 DIAGNOSIS — Z888 Allergy status to other drugs, medicaments and biological substances status: Secondary | ICD-10-CM | POA: Diagnosis not present

## 2021-10-16 DIAGNOSIS — R55 Syncope and collapse: Secondary | ICD-10-CM | POA: Diagnosis present

## 2021-10-16 DIAGNOSIS — Z800 Family history of malignant neoplasm of digestive organs: Secondary | ICD-10-CM

## 2021-10-16 DIAGNOSIS — F419 Anxiety disorder, unspecified: Secondary | ICD-10-CM | POA: Diagnosis present

## 2021-10-16 DIAGNOSIS — I110 Hypertensive heart disease with heart failure: Secondary | ICD-10-CM | POA: Diagnosis present

## 2021-10-16 DIAGNOSIS — R9431 Abnormal electrocardiogram [ECG] [EKG]: Secondary | ICD-10-CM | POA: Diagnosis not present

## 2021-10-16 DIAGNOSIS — Z9049 Acquired absence of other specified parts of digestive tract: Secondary | ICD-10-CM

## 2021-10-16 DIAGNOSIS — E039 Hypothyroidism, unspecified: Secondary | ICD-10-CM | POA: Diagnosis present

## 2021-10-16 DIAGNOSIS — Z833 Family history of diabetes mellitus: Secondary | ICD-10-CM

## 2021-10-16 DIAGNOSIS — I10 Essential (primary) hypertension: Secondary | ICD-10-CM | POA: Diagnosis not present

## 2021-10-16 DIAGNOSIS — Z881 Allergy status to other antibiotic agents status: Secondary | ICD-10-CM | POA: Diagnosis not present

## 2021-10-16 DIAGNOSIS — Z6833 Body mass index (BMI) 33.0-33.9, adult: Secondary | ICD-10-CM

## 2021-10-16 DIAGNOSIS — Z79890 Hormone replacement therapy (postmenopausal): Secondary | ICD-10-CM

## 2021-10-16 DIAGNOSIS — Z880 Allergy status to penicillin: Secondary | ICD-10-CM | POA: Diagnosis not present

## 2021-10-16 DIAGNOSIS — N3000 Acute cystitis without hematuria: Secondary | ICD-10-CM | POA: Diagnosis present

## 2021-10-16 DIAGNOSIS — Z23 Encounter for immunization: Secondary | ICD-10-CM

## 2021-10-16 DIAGNOSIS — E876 Hypokalemia: Secondary | ICD-10-CM | POA: Diagnosis present

## 2021-10-16 DIAGNOSIS — Z8051 Family history of malignant neoplasm of kidney: Secondary | ICD-10-CM

## 2021-10-16 DIAGNOSIS — B954 Other streptococcus as the cause of diseases classified elsewhere: Secondary | ICD-10-CM | POA: Diagnosis present

## 2021-10-16 DIAGNOSIS — I951 Orthostatic hypotension: Secondary | ICD-10-CM | POA: Diagnosis not present

## 2021-10-16 DIAGNOSIS — Z90710 Acquired absence of both cervix and uterus: Secondary | ICD-10-CM

## 2021-10-16 DIAGNOSIS — F319 Bipolar disorder, unspecified: Secondary | ICD-10-CM | POA: Diagnosis present

## 2021-10-16 DIAGNOSIS — I5033 Acute on chronic diastolic (congestive) heart failure: Secondary | ICD-10-CM | POA: Diagnosis present

## 2021-10-16 DIAGNOSIS — Z8673 Personal history of transient ischemic attack (TIA), and cerebral infarction without residual deficits: Secondary | ICD-10-CM | POA: Diagnosis not present

## 2021-10-16 DIAGNOSIS — G40A09 Absence epileptic syndrome, not intractable, without status epilepticus: Secondary | ICD-10-CM | POA: Diagnosis present

## 2021-10-16 DIAGNOSIS — R0989 Other specified symptoms and signs involving the circulatory and respiratory systems: Secondary | ICD-10-CM | POA: Diagnosis present

## 2021-10-16 DIAGNOSIS — K219 Gastro-esophageal reflux disease without esophagitis: Secondary | ICD-10-CM | POA: Diagnosis present

## 2021-10-16 DIAGNOSIS — Z803 Family history of malignant neoplasm of breast: Secondary | ICD-10-CM

## 2021-10-16 DIAGNOSIS — R519 Headache, unspecified: Secondary | ICD-10-CM | POA: Diagnosis not present

## 2021-10-16 DIAGNOSIS — Z91030 Bee allergy status: Secondary | ICD-10-CM

## 2021-10-16 DIAGNOSIS — N179 Acute kidney failure, unspecified: Secondary | ICD-10-CM | POA: Diagnosis present

## 2021-10-16 DIAGNOSIS — E785 Hyperlipidemia, unspecified: Secondary | ICD-10-CM | POA: Diagnosis present

## 2021-10-16 DIAGNOSIS — I4892 Unspecified atrial flutter: Secondary | ICD-10-CM | POA: Diagnosis present

## 2021-10-16 DIAGNOSIS — N390 Urinary tract infection, site not specified: Secondary | ICD-10-CM | POA: Diagnosis present

## 2021-10-16 DIAGNOSIS — E860 Dehydration: Secondary | ICD-10-CM | POA: Diagnosis present

## 2021-10-16 DIAGNOSIS — R0789 Other chest pain: Secondary | ICD-10-CM | POA: Diagnosis present

## 2021-10-16 DIAGNOSIS — Z86718 Personal history of other venous thrombosis and embolism: Secondary | ICD-10-CM

## 2021-10-16 LAB — TSH: TSH: 4.097 u[IU]/mL (ref 0.350–4.500)

## 2021-10-16 LAB — COMPREHENSIVE METABOLIC PANEL
ALT: 15 U/L (ref 0–44)
AST: 17 U/L (ref 15–41)
Albumin: 3.5 g/dL (ref 3.5–5.0)
Alkaline Phosphatase: 90 U/L (ref 38–126)
Anion gap: 8 (ref 5–15)
BUN: 21 mg/dL (ref 8–23)
CO2: 25 mmol/L (ref 22–32)
Calcium: 8.9 mg/dL (ref 8.9–10.3)
Chloride: 103 mmol/L (ref 98–111)
Creatinine, Ser: 1.45 mg/dL — ABNORMAL HIGH (ref 0.44–1.00)
GFR, Estimated: 41 mL/min — ABNORMAL LOW (ref >60)
Glucose, Bld: 115 mg/dL — ABNORMAL HIGH (ref 70–99)
Potassium: 3.2 mmol/L — ABNORMAL LOW (ref 3.5–5.1)
Sodium: 136 mmol/L (ref 135–145)
Total Bilirubin: 0.5 mg/dL (ref 0.3–1.2)
Total Protein: 6.6 g/dL (ref 6.5–8.1)

## 2021-10-16 LAB — URINALYSIS, COMPLETE (UACMP) WITH MICROSCOPIC
Bilirubin Urine: NEGATIVE
Glucose, UA: NEGATIVE mg/dL
Hgb urine dipstick: NEGATIVE
Ketones, ur: NEGATIVE mg/dL
Nitrite: POSITIVE — AB
Protein, ur: NEGATIVE mg/dL
Specific Gravity, Urine: 1.013 (ref 1.005–1.030)
pH: 6 (ref 5.0–8.0)

## 2021-10-16 LAB — AMMONIA: Ammonia: 23 umol/L (ref 9–35)

## 2021-10-16 LAB — MAGNESIUM: Magnesium: 1.9 mg/dL (ref 1.7–2.4)

## 2021-10-16 LAB — CBC
HCT: 36.7 % (ref 36.0–46.0)
HCT: 36.1 % (ref 36.0–46.0)
Hemoglobin: 11.9 g/dL — ABNORMAL LOW (ref 12.0–15.0)
Hemoglobin: 11.6 g/dL — ABNORMAL LOW (ref 12.0–15.0)
MCH: 27.3 pg (ref 26.0–34.0)
MCH: 27.3 pg (ref 26.0–34.0)
MCHC: 32.4 g/dL (ref 30.0–36.0)
MCHC: 32.1 g/dL (ref 30.0–36.0)
MCV: 84.2 fL (ref 80.0–100.0)
MCV: 84.9 fL (ref 80.0–100.0)
Platelets: 203 10*3/uL (ref 150–400)
Platelets: 198 10*3/uL (ref 150–400)
RBC: 4.36 MIL/uL (ref 3.87–5.11)
RBC: 4.25 MIL/uL (ref 3.87–5.11)
RDW: 13.8 % (ref 11.5–15.5)
RDW: 13.9 % (ref 11.5–15.5)
WBC: 12.7 10*3/uL — ABNORMAL HIGH (ref 4.0–10.5)
WBC: 8.1 10*3/uL (ref 4.0–10.5)
nRBC: 0 % (ref 0.0–0.2)
nRBC: 0 % (ref 0.0–0.2)

## 2021-10-16 LAB — TROPONIN I (HIGH SENSITIVITY): Troponin I (High Sensitivity): 10 ng/L (ref <18)

## 2021-10-16 LAB — ETHANOL: Alcohol, Ethyl (B): <10 mg/dL (ref <10)

## 2021-10-16 LAB — D-DIMER, QUANTITATIVE: D-Dimer, Quant: 0.55 ug/mL-FEU — ABNORMAL HIGH (ref 0.00–0.50)

## 2021-10-16 LAB — PHENYTOIN LEVEL, TOTAL: Phenytoin Lvl: 9.5 ug/mL — ABNORMAL LOW (ref 10.0–20.0)

## 2021-10-16 LAB — CREATININE, SERUM
Creatinine, Ser: 1.11 mg/dL — ABNORMAL HIGH (ref 0.44–1.00)
GFR, Estimated: 57 mL/min — ABNORMAL LOW (ref >60)

## 2021-10-16 MED ORDER — MAGNESIUM SULFATE 2 GM/50ML IV SOLN
2.0000 g | Freq: Once | INTRAVENOUS | Status: AC
  Administered 2021-10-16: 2 g via INTRAVENOUS
  Filled 2021-10-16: qty 50

## 2021-10-16 MED ORDER — PHENYTOIN SODIUM EXTENDED 100 MG PO CAPS
400.0000 mg | ORAL_CAPSULE | Freq: Every day | ORAL | Status: DC
  Administered 2021-10-17 – 2021-10-20 (×5): 400 mg via ORAL
  Filled 2021-10-16 (×5): qty 4

## 2021-10-16 MED ORDER — ACETAMINOPHEN 650 MG RE SUPP: 650.0000 mg | Freq: Four times a day (QID) | RECTAL | Status: DC | PRN

## 2021-10-16 MED ORDER — DONEPEZIL HCL 5 MG PO TABS
10.0000 mg | ORAL_TABLET | Freq: Every day | ORAL | Status: DC
  Administered 2021-10-17 – 2021-10-19 (×4): 10 mg via ORAL
  Filled 2021-10-16 (×4): qty 2

## 2021-10-16 MED ORDER — VALBENAZINE TOSYLATE 40 MG PO CAPS
80.0000 mg | ORAL_CAPSULE | Freq: Every day | ORAL | Status: DC
  Administered 2021-10-17 – 2021-10-19 (×4): 80 mg via ORAL
  Filled 2021-10-16 (×4): qty 2

## 2021-10-16 MED ORDER — PANTOPRAZOLE SODIUM 40 MG PO TBEC
40.0000 mg | DELAYED_RELEASE_TABLET | Freq: Two times a day (BID) | ORAL | Status: DC
  Administered 2021-10-17 – 2021-10-20 (×7): 40 mg via ORAL
  Filled 2021-10-16 (×8): qty 1

## 2021-10-16 MED ORDER — HEPARIN SODIUM (PORCINE) 5000 UNIT/ML IJ SOLN
5000.0000 [IU] | Freq: Three times a day (TID) | INTRAMUSCULAR | Status: DC
Start: 2021-10-16 — End: 2021-10-20
  Administered 2021-10-16 – 2021-10-20 (×12): 5000 [IU] via SUBCUTANEOUS
  Filled 2021-10-16 (×12): qty 1

## 2021-10-16 MED ORDER — DIAZEPAM 5 MG PO TABS
5.0000 mg | ORAL_TABLET | Freq: Every day | ORAL | Status: DC
  Administered 2021-10-17 – 2021-10-19 (×4): 5 mg via ORAL
  Filled 2021-10-16 (×4): qty 1

## 2021-10-16 MED ORDER — SODIUM CHLORIDE 0.9% FLUSH
3.0000 mL | Freq: Two times a day (BID) | INTRAVENOUS | Status: DC
  Administered 2021-10-16 – 2021-10-20 (×7): 3 mL via INTRAVENOUS

## 2021-10-16 MED ORDER — LAMOTRIGINE 100 MG PO TABS
200.0000 mg | ORAL_TABLET | Freq: Every day | ORAL | Status: DC
  Administered 2021-10-17 – 2021-10-20 (×4): 200 mg via ORAL
  Filled 2021-10-16 (×4): qty 2

## 2021-10-16 MED ORDER — ONDANSETRON HCL 4 MG PO TABS: 4.0000 mg | ORAL_TABLET | Freq: Four times a day (QID) | ORAL | Status: DC | PRN

## 2021-10-16 MED ORDER — SODIUM CHLORIDE 0.9 % IV SOLN
1.0000 g | Freq: Once | INTRAVENOUS | Status: AC
  Administered 2021-10-16: 1 g via INTRAVENOUS
  Filled 2021-10-16: qty 10

## 2021-10-16 MED ORDER — GABAPENTIN 300 MG PO CAPS
300.0000 mg | ORAL_CAPSULE | Freq: Every day | ORAL | Status: DC
  Administered 2021-10-17 – 2021-10-19 (×4): 300 mg via ORAL
  Filled 2021-10-16 (×4): qty 1

## 2021-10-16 MED ORDER — ONDANSETRON HCL 4 MG/2ML IJ SOLN: 4.0000 mg | Freq: Four times a day (QID) | INTRAMUSCULAR | Status: DC | PRN

## 2021-10-16 MED ORDER — LINACLOTIDE 72 MCG PO CAPS
72.0000 ug | ORAL_CAPSULE | Freq: Every day | ORAL | Status: DC
  Administered 2021-10-18 – 2021-10-20 (×3): 72 ug via ORAL
  Filled 2021-10-16 (×4): qty 1

## 2021-10-16 MED ORDER — LEVOTHYROXINE SODIUM 50 MCG PO TABS
75.0000 ug | ORAL_TABLET | Freq: Every day | ORAL | Status: DC
  Administered 2021-10-17 – 2021-10-20 (×4): 75 ug via ORAL
  Filled 2021-10-16 (×4): qty 1

## 2021-10-16 MED ORDER — ACETAMINOPHEN 325 MG PO TABS
650.0000 mg | ORAL_TABLET | Freq: Four times a day (QID) | ORAL | Status: DC | PRN
  Administered 2021-10-18 – 2021-10-20 (×5): 650 mg via ORAL
  Filled 2021-10-16 (×5): qty 2

## 2021-10-16 MED ORDER — ATORVASTATIN CALCIUM 20 MG PO TABS
40.0000 mg | ORAL_TABLET | Freq: Every day | ORAL | Status: DC
  Administered 2021-10-17 – 2021-10-19 (×4): 40 mg via ORAL
  Filled 2021-10-16 (×4): qty 2

## 2021-10-16 MED ORDER — LACTATED RINGERS IV BOLUS
1000.0000 mL | Freq: Once | INTRAVENOUS | Status: AC
  Administered 2021-10-16: 1000 mL via INTRAVENOUS

## 2021-10-16 MED ORDER — POTASSIUM CHLORIDE CRYS ER 20 MEQ PO TBCR
40.0000 meq | EXTENDED_RELEASE_TABLET | Freq: Once | ORAL | Status: AC
  Administered 2021-10-16: 40 meq via ORAL
  Filled 2021-10-16: qty 2

## 2021-10-16 MED ORDER — BUSPIRONE HCL 10 MG PO TABS
10.0000 mg | ORAL_TABLET | Freq: Three times a day (TID) | ORAL | Status: DC
  Administered 2021-10-17 – 2021-10-20 (×11): 10 mg via ORAL
  Filled 2021-10-16 (×11): qty 1

## 2021-10-16 MED ORDER — SODIUM CHLORIDE 0.9 % IV SOLN
1.0000 g | INTRAVENOUS | Status: AC
  Administered 2021-10-17 – 2021-10-20 (×4): 1 g via INTRAVENOUS
  Filled 2021-10-16: qty 1
  Filled 2021-10-16: qty 10
  Filled 2021-10-16: qty 1
  Filled 2021-10-16: qty 10

## 2021-10-16 MED ORDER — DESVENLAFAXINE SUCCINATE ER 50 MG PO TB24
150.0000 mg | ORAL_TABLET | Freq: Every day | ORAL | Status: DC
  Administered 2021-10-17 – 2021-10-19 (×3): 150 mg via ORAL
  Filled 2021-10-16 (×4): qty 1

## 2021-10-16 MED ORDER — BENZTROPINE MESYLATE 1 MG PO TABS
2.0000 mg | ORAL_TABLET | Freq: Every day | ORAL | Status: DC
  Administered 2021-10-17 – 2021-10-20 (×4): 2 mg via ORAL
  Filled 2021-10-16 (×4): qty 2

## 2021-10-16 MED ORDER — SODIUM CHLORIDE 0.9 % IV SOLN: INTRAVENOUS | Status: AC

## 2021-10-16 NOTE — ED Notes (Signed)
Pt unable to urinate, bladder scan used, 77ml noted in bladder. EDP notified

## 2021-10-17 ENCOUNTER — Inpatient Hospital Stay (HOSPITAL_COMMUNITY): Payer: Medicare Other

## 2021-10-17 ENCOUNTER — Inpatient Hospital Stay (HOSPITAL_COMMUNITY)
Admit: 2021-10-17 | Discharge: 2021-10-17 | Disposition: A | Discharge status: Home / Self Care | Payer: Medicare Other | Attending: Internal Medicine | Admitting: Internal Medicine

## 2021-10-17 DIAGNOSIS — R519 Headache, unspecified: Secondary | ICD-10-CM | POA: Diagnosis not present

## 2021-10-17 DIAGNOSIS — R55 Syncope and collapse: Secondary | ICD-10-CM

## 2021-10-17 DIAGNOSIS — E876 Hypokalemia: Secondary | ICD-10-CM

## 2021-10-17 DIAGNOSIS — N3000 Acute cystitis without hematuria: Secondary | ICD-10-CM

## 2021-10-17 DIAGNOSIS — N390 Urinary tract infection, site not specified: Secondary | ICD-10-CM | POA: Diagnosis present

## 2021-10-17 DIAGNOSIS — E039 Hypothyroidism, unspecified: Secondary | ICD-10-CM

## 2021-10-17 DIAGNOSIS — F489 Nonpsychotic mental disorder, unspecified: Secondary | ICD-10-CM | POA: Diagnosis present

## 2021-10-17 DIAGNOSIS — G40A09 Absence epileptic syndrome, not intractable, without status epilepticus: Secondary | ICD-10-CM

## 2021-10-17 DIAGNOSIS — R9431 Abnormal electrocardiogram [ECG] [EKG]: Secondary | ICD-10-CM | POA: Insufficient documentation

## 2021-10-17 DIAGNOSIS — I10 Essential (primary) hypertension: Secondary | ICD-10-CM | POA: Diagnosis present

## 2021-10-17 LAB — GLUCOSE, CAPILLARY
Glucose-Capillary: 103 mg/dL — ABNORMAL HIGH (ref 70–99)
Glucose-Capillary: 96 mg/dL (ref 70–99)
Glucose-Capillary: 165 mg/dL — ABNORMAL HIGH (ref 70–99)
Glucose-Capillary: 109 mg/dL — ABNORMAL HIGH (ref 70–99)

## 2021-10-17 LAB — COMPREHENSIVE METABOLIC PANEL
ALT: 15 U/L (ref 0–44)
AST: 14 U/L — ABNORMAL LOW (ref 15–41)
Albumin: 3.1 g/dL — ABNORMAL LOW (ref 3.5–5.0)
Alkaline Phosphatase: 80 U/L (ref 38–126)
Anion gap: 7 (ref 5–15)
BUN: 13 mg/dL (ref 8–23)
CO2: 26 mmol/L (ref 22–32)
Calcium: 8.6 mg/dL — ABNORMAL LOW (ref 8.9–10.3)
Chloride: 107 mmol/L (ref 98–111)
Creatinine, Ser: 0.81 mg/dL (ref 0.44–1.00)
GFR, Estimated: >60 mL/min (ref >60)
Glucose, Bld: 82 mg/dL (ref 70–99)
Potassium: 3 mmol/L — ABNORMAL LOW (ref 3.5–5.1)
Sodium: 140 mmol/L (ref 135–145)
Total Bilirubin: 0.6 mg/dL (ref 0.3–1.2)
Total Protein: 5.9 g/dL — ABNORMAL LOW (ref 6.5–8.1)

## 2021-10-17 LAB — NM MYOCAR MULTI W/SPECT W/WALL MOTION / EF
Base ST Depression (mm): 0 mm
LV dias vol: 52 mL (ref 46–106)
LV sys vol: 11 mL
Nuc Stress EF: 79 %
Peak HR: 109 {beats}/min
Percent HR: 68 %
Rest HR: 82 {beats}/min
Rest Nuclear Isotope Dose: 10.3 mCi
SDS: 1
SRS: 3
SSS: 1
ST Depression (mm): 0 mm
Stress Nuclear Isotope Dose: 32.5 mCi
TID: 0.84

## 2021-10-17 LAB — ECHOCARDIOGRAM COMPLETE
AR max vel: 3.17 cm2
AV Area VTI: 3.22 cm2
AV Area mean vel: 2.97 cm2
AV Mean grad: 3 mmHg
AV Peak grad: 6.2 mmHg
Ao pk vel: 1.24 m/s
Area-P 1/2: 2.62 cm2
Height: 61 in
S' Lateral: 3.1 cm
Weight: 2846.58 oz

## 2021-10-17 LAB — CBC
HCT: 35.1 % — ABNORMAL LOW (ref 36.0–46.0)
Hemoglobin: 11.3 g/dL — ABNORMAL LOW (ref 12.0–15.0)
MCH: 27.5 pg (ref 26.0–34.0)
MCHC: 32.2 g/dL (ref 30.0–36.0)
MCV: 85.4 fL (ref 80.0–100.0)
Platelets: 171 10*3/uL (ref 150–400)
RBC: 4.11 MIL/uL (ref 3.87–5.11)
RDW: 13.8 % (ref 11.5–15.5)
WBC: 6.7 10*3/uL (ref 4.0–10.5)
nRBC: 0 % (ref 0.0–0.2)

## 2021-10-17 LAB — PHENYTOIN LEVEL, TOTAL: Phenytoin Lvl: 12.7 ug/mL (ref 10.0–20.0)

## 2021-10-17 MED ORDER — POTASSIUM CHLORIDE 20 MEQ PO PACK
40.0000 meq | PACK | Freq: Two times a day (BID) | ORAL | Status: AC
Start: 2021-10-17 — End: 2021-10-17
  Administered 2021-10-17 (×2): 40 meq via ORAL
  Filled 2021-10-17 (×2): qty 2

## 2021-10-17 MED ORDER — LOSARTAN POTASSIUM 50 MG PO TABS: 100.0000 mg | ORAL_TABLET | Freq: Every day | ORAL | Status: DC

## 2021-10-17 MED ORDER — HYDRALAZINE HCL 20 MG/ML IJ SOLN
10.0000 mg | Freq: Four times a day (QID) | INTRAMUSCULAR | Status: DC | PRN
Start: 2021-10-17 — End: 2021-10-20
  Administered 2021-10-17 – 2021-10-19 (×4): 10 mg via INTRAVENOUS
  Filled 2021-10-17 (×4): qty 1

## 2021-10-17 MED ORDER — TECHNETIUM TC 99M TETROFOSMIN IV KIT
32.5000 | PACK | Freq: Once | INTRAVENOUS | Status: AC | PRN
  Administered 2021-10-17: 32.5 via INTRAVENOUS

## 2021-10-17 MED ORDER — PNEUMOCOCCAL VAC POLYVALENT 25 MCG/0.5ML IJ INJ
0.5000 mL | INJECTION | INTRAMUSCULAR | Status: AC
  Administered 2021-10-20: 0.5 mL via INTRAMUSCULAR
  Filled 2021-10-17 (×2): qty 0.5

## 2021-10-17 MED ORDER — REGADENOSON 0.4 MG/5ML IV SOLN
0.4000 mg | Freq: Once | INTRAVENOUS | Status: AC
  Administered 2021-10-17: 0.4 mg via INTRAVENOUS

## 2021-10-17 MED ORDER — TECHNETIUM TC 99M TETROFOSMIN IV KIT
10.0000 | PACK | Freq: Once | INTRAVENOUS | Status: AC
  Administered 2021-10-17: 10.25 via INTRAVENOUS

## 2021-10-17 NOTE — Evaluation (Signed)
Physical Therapy Evaluation Patient Details Name: Robin Jensen MRN: [id number] DOB: 1961/02/08 Today's Date: 10/17/2021  History of Present Illness  Robin Jensen is a 61 y.o. 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 Pt presents to the ED 6/25 for evaluation after syncopal episode PTA witnessed by family.    Clinical Impression  Pt received in supine position and agreeable to therapy.  Pt performed bed mobility with good technique and did not display signs or symptoms of orthostatic hypotension as she did previously with OT.  Pt then transferred to standing and gait belt applied prior to ambulation.  Pt demonstrates significant posterior trunk lean when ambulating which in turn limits her ability to taking bigger step.  Pt assessed and has significant difficulty with any glute strengthening exercise (donkey kicks and clamshells) which may be contributing to posterior lean.  Pt would benefit from HEP in order to improve strength to prevent posterior falls, which she endorses as the most prevalent direction in which she falls.  Pt still able to ambulate around the nursing station, and reported fatigue upon returning to the room.  Will continue to monitor and intend on developing HEP for next session.  Current discharge plans to home with HHPT are appropriate at this time.  Pt will continue to benefit from skilled therapy in order to address deficits listed below.        Recommendations for follow up therapy are one component of a multi-disciplinary discharge planning process, led by the attending physician.  Recommendations may be updated based on patient status, additional functional criteria and insurance authorization.  Follow Up  Recommendations Home health PT (with anticipation of pt transitioning to OPPT services.)      Assistance Recommended at Discharge PRN  Patient can return home with the following       Equipment Recommendations None recommended by PT  Recommendations for Other Services       Functional Status Assessment       Precautions / Restrictions Precautions Precautions: Fall Precaution Comments: check orthostatics Restrictions Weight Bearing Restrictions: No      Mobility  Bed Mobility Overal bed mobility: Needs Assistance Bed Mobility: Supine to Sit     Supine to sit: Supervision, HOB elevated          Transfers Overall transfer level: Needs assistance Equipment used: Rolling walker (2 wheels) Transfers: Sit to/from Stand Sit to Stand: Supervision                Ambulation/Gait Ambulation/Gait assistance: Min guard Gait Distance (Feet): 160 Feet Assistive device: 1 person hand held assist, None Gait Pattern/deviations: Step-through pattern, Decreased stride length, Leaning posteriorly Gait velocity: decreased.     General Gait Details: heavy posterior lean when ambulating causing smaller, more unsafe stepping pattern.  Stairs            Wheelchair Mobility    Modified Rankin (Stroke Patients Only)       Balance                                             Pertinent Vitals/Pain Pain Assessment Pain Assessment: No/denies pain    Home Living Family/patient expects to be discharged  to:: Private residence Living Arrangements: Spouse/significant other Available Help at Discharge: Family Type of Home: Mobile home Home Access: Stairs to enter Entrance Stairs-Rails: Right Entrance Stairs-Number of Steps: 1   Home Layout: One level Home Equipment: Agricultural consultant (2 wheels);Shower seat      Prior Function Prior Level of Function : Needs assist       Physical Assist : ADLs (physical)   ADLs (physical): IADLs Mobility  Comments: Pt reports she "should" use RW but doesn't, endorses at least 3 falls in past couple months ADLs Comments: Pt reports indep with basic ADL, no family present to verify (last admission required assist for LB ADL at baseline). pt reports she and spouse both cook/clean and she does not drive. Uses weekly pill box for meds     Hand Dominance   Dominant Hand: Right    Extremity/Trunk Assessment   Upper Extremity Assessment Upper Extremity Assessment: Generalized weakness    Lower Extremity Assessment Lower Extremity Assessment: Generalized weakness       Communication   Communication: No difficulties  Cognition Arousal/Alertness: Awake/alert Behavior During Therapy: WFL for tasks assessed/performed Overall Cognitive Status: No family/caregiver present to determine baseline cognitive functioning                                 General Comments: generally appropriate, follows commands, PRN VC for safety        General Comments General comments (skin integrity, edema, etc.): Pt endorsed dizziness upon sitting and after standing/walking for ~42min, see vitals section for BP details    Exercises Other Exercises Other Exercises: Pt educated in falls prevention and need for moving slowly when feeling dizzy.  Pt also edcuated in performing hip extension exercises due to lack of glute strength in the B LE.   Assessment/Plan    PT Assessment Patient needs continued PT services  PT Problem List Decreased strength;Decreased activity tolerance;Decreased balance;Decreased mobility       PT Treatment Interventions DME instruction;Gait training;Functional mobility training;Therapeutic activities;Therapeutic exercise;Balance training;Neuromuscular re-education    PT Goals (Current goals can be found in the Care Plan section)  Acute Rehab PT Goals Patient Stated Goal: to get home and stop falling. PT Goal Formulation: With patient Time For Goal Achievement:  10/31/21 Potential to Achieve Goals: Good    Frequency Min 2X/week     Co-evaluation               AM-PAC PT "6 Clicks" Mobility  Outcome Measure Help needed turning from your back to your side while in a flat bed without using bedrails?: None Help needed moving from lying on your back to sitting on the side of a flat bed without using bedrails?: None Help needed moving to and from a bed to a chair (including a wheelchair)?: A Little Help needed standing up from a chair using your arms (e.g., wheelchair or bedside chair)?: A Little Help needed to walk in hospital room?: A Little Help needed climbing 3-5 steps with a railing? : A Little 6 Click Score: 20    End of Session Equipment Utilized During Treatment: Gait belt Activity Tolerance: Patient tolerated treatment well Patient left: in bed;with call bell/phone within reach;with bed alarm set;Other (comment) (imaging arriving to room.) Nurse Communication: Mobility status PT Visit Diagnosis: Unsteadiness on feet (R26.81);Other abnormalities of gait and mobility (R26.89);Repeated falls (R29.6);Muscle weakness (generalized) (M62.81);History of falling (Z91.81);Difficulty in walking, not elsewhere classified (R26.2)  Time: 1610-9604 PT Time Calculation (min) (ACUTE ONLY): 20 min   Charges:   PT Evaluation $PT Eval Low Complexity: 1 Low PT Treatments $Gait Training: 8-22 mins        Nolon Bussing, PT, DPT 10/17/21, 11:33 AM   Phineas Real 10/17/2021, 11:29 AM

## 2021-10-17 NOTE — Assessment & Plan Note (Addendum)
   Continue home BuSpar, Valium, Pristiq, Cogentin, Lamictal, Ingrezza  Switched Risperdal to Zyprexa d/t concern for long QT, confirmed pt has never been on Zyprexa/olanzapine in the past so no known intolerance

## 2021-10-17 NOTE — Assessment & Plan Note (Addendum)
   Continue home medications  Lamictal and Dilantin levels  Dilantin slightly subtherapeutic, repeat level was normal 10/18/21

## 2021-10-17 NOTE — Assessment & Plan Note (Addendum)
Replacing for Mg 1.5 today. Monitor Mg, replace as needed. 

## 2021-10-17 NOTE — Assessment & Plan Note (Signed)
migrain history  On EMgality

## 2021-10-17 NOTE — Assessment & Plan Note (Addendum)
   Labile BP, orthostasis initially  Hydralazine as needed  Restart home medications, adjust per cardiology  With IV fluids given, blood pressure has been increasing, likely secondary to acute diastolic heart failure.  Gently diuresing.

## 2021-10-17 NOTE — Assessment & Plan Note (Addendum)
   Urine cultures growing out Citrobacter and strep viridans, both sensitive to Rocephin.  We will continue

## 2021-10-17 NOTE — Assessment & Plan Note (Addendum)
Probably multifactorial, new atrial flutter in setting of dehydration, orthostasis, autonomic dysfunction.  Echocardiogram no major findings  Lexiscan stress test 06/26: Low risk study. normal without evidence of significant ischemia or scar, LVEF >65%, no significant coronary calcification   Cardiology applied Zio patch prior to discharge and they will follow-up with her as outpatient

## 2021-10-18 DIAGNOSIS — R55 Syncope and collapse: Secondary | ICD-10-CM | POA: Diagnosis not present

## 2021-10-18 DIAGNOSIS — R9431 Abnormal electrocardiogram [ECG] [EKG]: Secondary | ICD-10-CM | POA: Diagnosis present

## 2021-10-18 DIAGNOSIS — R519 Headache, unspecified: Secondary | ICD-10-CM | POA: Diagnosis not present

## 2021-10-18 DIAGNOSIS — E876 Hypokalemia: Secondary | ICD-10-CM | POA: Diagnosis not present

## 2021-10-18 DIAGNOSIS — E039 Hypothyroidism, unspecified: Secondary | ICD-10-CM | POA: Diagnosis not present

## 2021-10-18 LAB — CBC
HCT: 36.2 % (ref 36.0–46.0)
Hemoglobin: 11.7 g/dL — ABNORMAL LOW (ref 12.0–15.0)
MCH: 27.7 pg (ref 26.0–34.0)
MCHC: 32.3 g/dL (ref 30.0–36.0)
MCV: 85.8 fL (ref 80.0–100.0)
Platelets: 160 10*3/uL (ref 150–400)
RBC: 4.22 MIL/uL (ref 3.87–5.11)
RDW: 14 % (ref 11.5–15.5)
WBC: 5.5 10*3/uL (ref 4.0–10.5)
nRBC: 0 % (ref 0.0–0.2)

## 2021-10-18 LAB — BASIC METABOLIC PANEL
Anion gap: 9 (ref 5–15)
BUN: 9 mg/dL (ref 8–23)
CO2: 25 mmol/L (ref 22–32)
Calcium: 8.8 mg/dL — ABNORMAL LOW (ref 8.9–10.3)
Chloride: 108 mmol/L (ref 98–111)
Creatinine, Ser: 1.01 mg/dL — ABNORMAL HIGH (ref 0.44–1.00)
GFR, Estimated: >60 mL/min (ref >60)
Glucose, Bld: 112 mg/dL — ABNORMAL HIGH (ref 70–99)
Potassium: 3.4 mmol/L — ABNORMAL LOW (ref 3.5–5.1)
Sodium: 142 mmol/L (ref 135–145)

## 2021-10-18 LAB — MAGNESIUM: Magnesium: 2.1 mg/dL (ref 1.7–2.4)

## 2021-10-18 LAB — GLUCOSE, CAPILLARY: Glucose-Capillary: 108 mg/dL — ABNORMAL HIGH (ref 70–99)

## 2021-10-18 MED ORDER — HYDRALAZINE HCL 50 MG PO TABS
100.0000 mg | ORAL_TABLET | Freq: Three times a day (TID) | ORAL | Status: DC
  Administered 2021-10-18 – 2021-10-20 (×7): 100 mg via ORAL
  Filled 2021-10-18 (×7): qty 2

## 2021-10-18 MED ORDER — LOSARTAN POTASSIUM-HCTZ 100-25 MG PO TABS: 1.0000 | ORAL_TABLET | Freq: Every day | ORAL | Status: DC

## 2021-10-18 MED ORDER — HYDROCHLOROTHIAZIDE 25 MG PO TABS
25.0000 mg | ORAL_TABLET | Freq: Every day | ORAL | Status: DC
  Administered 2021-10-18: 25 mg via ORAL
  Filled 2021-10-18: qty 1

## 2021-10-18 MED ORDER — RISPERIDONE 0.5 MG PO TABS
0.5000 mg | ORAL_TABLET | Freq: Every day | ORAL | Status: DC
Start: 2021-10-18 — End: 2021-10-18
  Administered 2021-10-18: 0.5 mg via ORAL
  Filled 2021-10-18: qty 1

## 2021-10-18 MED ORDER — POTASSIUM CHLORIDE CRYS ER 20 MEQ PO TBCR
40.0000 meq | EXTENDED_RELEASE_TABLET | Freq: Once | ORAL | Status: AC
  Administered 2021-10-18: 40 meq via ORAL
  Filled 2021-10-18: qty 2

## 2021-10-18 MED ORDER — LABETALOL HCL 200 MG PO TABS
300.0000 mg | ORAL_TABLET | Freq: Two times a day (BID) | ORAL | Status: DC
  Administered 2021-10-18 – 2021-10-20 (×5): 300 mg via ORAL
  Filled 2021-10-18 (×5): qty 1

## 2021-10-18 MED ORDER — CYCLOBENZAPRINE HCL 10 MG PO TABS
10.0000 mg | ORAL_TABLET | Freq: Three times a day (TID) | ORAL | Status: DC | PRN
Start: 2021-10-18 — End: 2021-10-20

## 2021-10-18 MED ORDER — VERAPAMIL HCL ER 120 MG PO TBCR
120.0000 mg | EXTENDED_RELEASE_TABLET | Freq: Every morning | ORAL | Status: DC
  Administered 2021-10-18 – 2021-10-19 (×2): 120 mg via ORAL
  Filled 2021-10-18 (×2): qty 1

## 2021-10-18 MED ORDER — LOSARTAN POTASSIUM 50 MG PO TABS
100.0000 mg | ORAL_TABLET | Freq: Every day | ORAL | Status: DC
  Administered 2021-10-18 – 2021-10-20 (×3): 100 mg via ORAL
  Filled 2021-10-18 (×3): qty 2

## 2021-10-18 MED ORDER — OLANZAPINE 2.5 MG PO TABS
2.5000 mg | ORAL_TABLET | Freq: Every day | ORAL | Status: DC
  Administered 2021-10-18 – 2021-10-19 (×2): 2.5 mg via ORAL
  Filled 2021-10-18 (×2): qty 1

## 2021-10-18 MED ORDER — LOPERAMIDE HCL 2 MG PO CAPS
4.0000 mg | ORAL_CAPSULE | Freq: Three times a day (TID) | ORAL | Status: DC | PRN
  Administered 2021-10-18: 4 mg via ORAL
  Filled 2021-10-18: qty 2

## 2021-10-18 NOTE — Assessment & Plan Note (Addendum)
   Have consulted pharmacy to do a detailed medication review to assess for potential changes  Discussed with patient and she was amenable to changing her Risperdal to Zyprexa

## 2021-10-18 NOTE — TOC Initial Note (Signed)
Transition of Care [facility]) - Initial/Assessment Note    Patient Details  Name: Robin Jensen MRN: [id number] Date of Birth: 06-02-1960  Transition of Care [facility] CM/SW Contact:    Truddie Hidden, RN Phone Number: 10/18/2021, 3:16 PM  Clinical Narrative:                  PCP:Dr. Karie Fetch Pharmacy:[facility]  Current home health/prior home health/DME:Walker, shower chair Patient lives with her fiance. States her fiance will transport her home. Advised of HH therapy recommendation. Patient declined but agreeable to outpatient therapy at [facility]. Patient provided list of rehab. Denied any questions  Referral for outpatient therapy for [facility] faxed         Patient Goals and CMS Choice        Expected Discharge Plan and Services                                                Prior Living Arrangements/Services                       Activities of Daily Living Home Assistive Devices/Equipment: Shower chair with back, Environmental consultant (specify type) (front wheels) ADL Screening (condition at time of admission) Patient's cognitive ability adequate to safely complete daily activities?: Yes Is the patient deaf or have difficulty hearing?: No Does the patient have difficulty seeing, even when wearing glasses/contacts?: No Does the patient have difficulty concentrating, remembering, or making decisions?: No Patient able to express need for assistance with ADLs?: Yes Does the patient have difficulty dressing or bathing?: No Independently performs ADLs?: Yes (appropriate for developmental age) Does the patient have difficulty walking or climbing stairs?: No Weakness of Legs: None Weakness of Arms/Hands: None  Permission Sought/Granted                  Emotional Assessment              Admission diagnosis:  Syncope and collapse [R55] Syncope [R55] Acute cystitis without hematuria [N30.00] AKI (acute kidney injury) (HCC) [N17.9] Atrial  flutter, unspecified type (HCC) [I48.92] Patient Active Problem List   Diagnosis Date Noted   Prolonged QT interval 10/18/2021   UTI (urinary tract infection) 10/17/2021   Uncontrolled hypertension 10/17/2021   Mental health problem: History of schizoaffective, bipolar 1, depression, anxiety 10/17/2021   QT prolongation    Syncope 10/16/2021   AMS (altered mental status) 08/28/2021   Altered mental status 08/27/2021   Seizure disorder (HCC) 08/27/2021   Anxiety and depression 08/27/2021   Dyslipidemia 08/27/2021   Acute lower urinary tract infection 08/27/2021   Hypokalemia 08/27/2021   AKI (acute kidney injury) (HCC) 08/27/2021   Hyponatremia 08/27/2021   Hypocalcemia 12/24/2020   Dizziness 11/20/2017   Ataxia 10/04/2017   Headache disorder 06/12/2017   Loss of memory 06/12/2017   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, without behavioral disturbance (HCC) 02/15/2016   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/15/2016   Agoraphobia 01/28/2016   Borderline personality disorder (HCC) 01/28/2016   Falls 01/28/2016   Panic disorder 01/28/2016   Schizoaffective disorder, bipolar type (HCC) 01/28/2016   Spells of decreased attentiveness 01/28/2016   Cervical cancer (HCC) 05/06/2013   Aneurysm, cerebral, nonruptured 08/14/2012   Hypothyroidism 08/14/2012   Other and unspecified hyperlipidemia 08/14/2012   Seizures (HCC) 08/14/2012   Insomnia 08/14/2012  Unspecified constipation 08/07/2012   FHx: colon cancer 08/07/2012   Right sided abdominal pain 08/07/2012   Essential hypertension 08/07/2012   Abnormal LFTs 08/07/2012   PCP:  Emogene Morgan, MD Pharmacy:   [phone] [place] [place] - [address] [place] [address] Phone: [phone] Fax: [phone]     Social Determinants of Health (SDOH) Interventions    Readmission Risk Interventions     No data to display

## 2021-10-19 DIAGNOSIS — E876 Hypokalemia: Secondary | ICD-10-CM | POA: Diagnosis not present

## 2021-10-19 DIAGNOSIS — I5033 Acute on chronic diastolic (congestive) heart failure: Secondary | ICD-10-CM | POA: Diagnosis not present

## 2021-10-19 DIAGNOSIS — R55 Syncope and collapse: Secondary | ICD-10-CM | POA: Diagnosis not present

## 2021-10-19 DIAGNOSIS — N3000 Acute cystitis without hematuria: Secondary | ICD-10-CM | POA: Diagnosis not present

## 2021-10-19 DIAGNOSIS — R9431 Abnormal electrocardiogram [ECG] [EKG]: Secondary | ICD-10-CM | POA: Diagnosis not present

## 2021-10-19 DIAGNOSIS — I10 Essential (primary) hypertension: Secondary | ICD-10-CM | POA: Diagnosis not present

## 2021-10-19 LAB — BASIC METABOLIC PANEL
Anion gap: 7 (ref 5–15)
BUN: 12 mg/dL (ref 8–23)
CO2: 26 mmol/L (ref 22–32)
Calcium: 8.9 mg/dL (ref 8.9–10.3)
Chloride: 106 mmol/L (ref 98–111)
Creatinine, Ser: 1.05 mg/dL — ABNORMAL HIGH (ref 0.44–1.00)
GFR, Estimated: >60 mL/min (ref >60)
Glucose, Bld: 99 mg/dL (ref 70–99)
Potassium: 3.5 mmol/L (ref 3.5–5.1)
Sodium: 139 mmol/L (ref 135–145)

## 2021-10-19 LAB — CBC
HCT: 32.6 % — ABNORMAL LOW (ref 36.0–46.0)
Hemoglobin: 10.5 g/dL — ABNORMAL LOW (ref 12.0–15.0)
MCH: 27.3 pg (ref 26.0–34.0)
MCHC: 32.2 g/dL (ref 30.0–36.0)
MCV: 84.9 fL (ref 80.0–100.0)
Platelets: 165 10*3/uL (ref 150–400)
RBC: 3.84 MIL/uL — ABNORMAL LOW (ref 3.87–5.11)
RDW: 14.1 % (ref 11.5–15.5)
WBC: 6.2 10*3/uL (ref 4.0–10.5)
nRBC: 0 % (ref 0.0–0.2)

## 2021-10-19 LAB — URINE CULTURE
Culture: >=100000 — AB
Culture: >=100000 — AB

## 2021-10-19 LAB — BRAIN NATRIURETIC PEPTIDE: B Natriuretic Peptide: 55.8 pg/mL (ref 0.0–100.0)

## 2021-10-19 LAB — PHENYTOIN LEVEL, FREE AND TOTAL
Phenytoin, Free: 1.3 ug/mL (ref 1.0–2.0)
Phenytoin, Total: 14 ug/mL (ref 10.0–20.0)

## 2021-10-19 LAB — GLUCOSE, CAPILLARY: Glucose-Capillary: 98 mg/dL (ref 70–99)

## 2021-10-19 MED ORDER — FUROSEMIDE 10 MG/ML IJ SOLN
20.0000 mg | Freq: Once | INTRAMUSCULAR | Status: AC
  Administered 2021-10-19: 20 mg via INTRAVENOUS
  Filled 2021-10-19: qty 2

## 2021-10-19 MED ORDER — VERAPAMIL HCL ER 240 MG PO TBCR
240.0000 mg | EXTENDED_RELEASE_TABLET | Freq: Every morning | ORAL | Status: DC
  Administered 2021-10-20: 240 mg via ORAL
  Filled 2021-10-19: qty 1

## 2021-10-19 MED ORDER — FUROSEMIDE 10 MG/ML IJ SOLN
20.0000 mg | Freq: Two times a day (BID) | INTRAMUSCULAR | Status: DC
  Administered 2021-10-19 – 2021-10-20 (×2): 20 mg via INTRAVENOUS
  Filled 2021-10-19 (×2): qty 2

## 2021-10-19 NOTE — Progress Notes (Signed)
Occupational Therapy Treatment Patient Details Name: Robin Jensen MRN: [id number] DOB: 14-Sep-1960 Today's Date: 10/19/2021   History of present illness Robin Jensen is a 61 y.o. 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 Pt presents to the ED 6/25 for evaluation after syncopal episode PTA witnessed by family.   OT comments  Pt seen for OT tx this date.  Confirmed with RN pt's ability to participate d/t elevated BP when last recorded.  RN stated she'd be in to give pt BP meds shortly.  Pt denied any pain, dizziness, or light headedness with all positional changes this date.  Pt performed toileting, standing grooming tasks, and functional mobility around nurse's station x1 with RW and supv.  Pt given RW to carry over PT recommendation for slight forward lean into walker as pt reports she has tendency to over correct her posture and exhibits slight posterior lean.  Pt reports she's hoping to d/c later today, with plan to go home with heart monitor.  Pt will continue to benefit from skilled OT in the acute setting to reinforce ADL safety, and continue to recommend [facility] OT to ensure ADL safety within home environment.     Recommendations for follow up therapy are one component of a multi-disciplinary discharge planning process, led by the attending physician.  Recommendations may be updated based on patient status, additional functional criteria and insurance authorization.    Follow Up Recommendations  Home health OT    Assistance Recommended at Discharge Intermittent Supervision/Assistance  Patient can return home with the following  A little help with walking and/or transfers;A little help with bathing/dressing/bathroom;Assistance with  cooking/housework;Assist for transportation;Help with stairs or ramp for entrance;Direct supervision/assist for medications management   Equipment Recommendations  None recommended by OT          Precautions / Restrictions Precautions Precautions: Fall Precaution Comments: RN stated tech had taken orthostatics earlier and OT did not need to repeat at this time Restrictions Weight Bearing Restrictions: No Other Position/Activity Restrictions: Pt with no report of dizziness or lightheadeness with positional changes.       Mobility Bed Mobility Overal bed mobility: Modified Independent Bed Mobility: Supine to Sit, Sit to Supine     Supine to sit: Modified independent (Device/Increase time), HOB elevated Sit to supine: Modified independent (Device/Increase time), HOB elevated   General bed mobility comments: increased time required. no physical assistance needed Patient Response: Cooperative  Transfers Overall transfer level: Needs assistance Equipment used: Rolling walker (2 wheels) Transfers: Sit to/from Stand Sit to Stand: Supervision           General transfer comment: reinforced slow positional changes and monitoring for symptoms of pre-syncope for fall prevention.     Balance Overall balance assessment: Needs assistance Sitting-balance support: No upper extremity supported, Feet supported Sitting balance-Leahy Scale: Good     Standing balance support: No upper extremity supported, During functional activity Standing balance-Leahy Scale: Fair Standing balance comment: tolerated standing at sink for grooming tasks, no UE support or LOB when reaching within BOS                           ADL either performed or assessed with clinical judgement   ADL Overall ADL's : Needs assistance/impaired     Grooming:  Wash/dry hands;Oral care;Supervision/safety;Standing                   Toilet Transfer: Supervision/safety;Rolling walker (2  wheels) Toilet Transfer Details (indicate cue type and reason): able to manage safely without use of grab bar Toileting- Clothing Manipulation and Hygiene: Supervision/safety;Sit to/from stand       Functional mobility during ADLs: Supervision/safety;Rolling walker (2 wheels) General ADL Comments: Pt tolerated toileting, standing grooming tasks, and functional mobility around the nurse's station x1 with supv and RW, all without reports of pain or dizziness.    Extremity/Trunk Assessment Upper Extremity Assessment Upper Extremity Assessment: Overall WFL for tasks assessed   Lower Extremity Assessment Lower Extremity Assessment: Generalized weakness        Vision Patient Visual Report: No change from baseline                Cognition Arousal/Alertness: Awake/alert Behavior During Therapy: WFL for tasks assessed/performed Overall Cognitive Status: Within Functional Limits for tasks assessed                                                       General Comments No dizziness reported today with any positional changes    Pertinent Vitals/ Pain       Pain Assessment Pain Assessment: No/denies pain                                                          Frequency  Min 2X/week        Progress Toward Goals  OT Goals(current goals can now be found in the care plan section)  Progress towards OT goals: Progressing toward goals  Acute Rehab OT Goals Patient Stated Goal: go home OT Goal Formulation: With patient Time For Goal Achievement: 10/31/21 Potential to Achieve Goals: Good  Plan Discharge plan remains appropriate                     AM-PAC OT "6 Clicks" Daily Activity     Outcome Measure   Help from another person eating meals?: None Help from another person taking care of personal grooming?: A Little Help from another person toileting, which includes using toliet, bedpan, or urinal?: A Little Help  from another person bathing (including washing, rinsing, drying)?: A Little Help from another person to put on and taking off regular upper body clothing?: None Help from another person to put on and taking off regular lower body clothing?: A Little 6 Click Score: 20    End of Session Equipment Utilized During Treatment: Gait belt;Rolling walker (2 wheels)  OT Visit Diagnosis: Other abnormalities of gait and mobility (R26.89);Repeated falls (R29.6);Muscle weakness (generalized) (M62.81)   Activity Tolerance Patient tolerated treatment well   Patient Left in bed;with call bell/phone within reach;Other (comment) (Confirmed with RN that bed alarm can be left off.)   Nurse Communication Mobility status        Time: 9628-3662 OT Time Calculation (min): 17 min  Charges: OT General Charges $OT Visit: 1 Visit OT Treatments $Self Care/Home Management : 8-22 mins  Leta Speller, MS, OTR/L   Darleene Cleaver 10/19/2021, 2:26 PM

## 2021-10-19 NOTE — Assessment & Plan Note (Signed)
Blood pressure remaining persistently elevated despite multiple medications plus hydralazine.  Have given IV Lasix and patient is diuresed over 4 L and is more than -2 L deficient.

## 2021-10-19 NOTE — Progress Notes (Signed)
Physical Therapy Treatment Patient Details Name: Robin Jensen MRN: [id number] DOB: October 29, 1960 Today's Date: 10/19/2021   History of Present Illness Robin Jensen is a 61 y.o. 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 Pt presents to the ED 6/25 for evaluation after syncopal episode PTA witnessed by family.    PT Comments    Patient is agreeable to PT and is making progress with functional independence and with increased activity tolerance this session. Orthostatic vitals taken during session with no reported dizziness  with activity. Patient was educated on monitoring for pre-syncope symptoms and taking time with transitions for fall prevention. No symptoms were reported during this session. She walked 2 laps around the nursing unit with Min guard assistance. She feels generalized weakness and fatigue after walking but feels improved overall from arrival. She is agreeable to outpatient PT at discharge which is recommended for strengtening, conditioning, and high level balance activity. PT will continue to follow.    Recommendations for follow up therapy are one component of a multi-disciplinary discharge planning process, led by the attending physician.  Recommendations may be updated based on patient status, additional functional criteria and insurance authorization.  Follow Up Recommendations  Home health PT     Assistance Recommended at Discharge PRN  Patient can return home with the following Help with stairs or ramp for entrance;Assist for transportation   Equipment Recommendations  None recommended by PT (patient declined)    Recommendations for Other Services       Precautions / Restrictions Precautions Precautions:  Fall Precaution Comments: check orthostatics Restrictions Weight Bearing Restrictions: No     Mobility  Bed Mobility Overal bed mobility: Needs Assistance Bed Mobility: Supine to Sit, Sit to Supine     Supine to sit: Modified independent (Device/Increase time), HOB elevated Sit to supine: Modified independent (Device/Increase time), HOB elevated   General bed mobility comments: increased time required. no physical assistance needed    Transfers Overall transfer level: Needs assistance Equipment used: None Transfers: Sit to/from Stand Sit to Stand: Supervision           General transfer comment: generalized safety instructions provided for taking time with transitions and monitoring for symptoms of pre-syncope for fall prevention at home. no dizziness is reported today with functional mobility    Ambulation/Gait Ambulation/Gait assistance: Min guard Gait Distance (Feet): 330 Feet Assistive device: None (she declined using rolling walker) Gait Pattern/deviations: Step-through pattern, Narrow base of support Gait velocity: decreased     General Gait Details: slow but steady cadence with walking 2 laps around nursing unit. Min gaurd provided for safety with no dizziness reported with activity. she feels she is improving overall, but feels generalized weakness after mobilizing. education provided for progressing activity slowly, energy conservation, and how to establish a walking program at home. mild shortness of breath after walking that improved with rest breaks. heart rate in the 80's after mobilizing. she may benefit from a rolling walker or rollator for improving gait pattern and confidence with walking, however she declined at this time.   Stairs             Wheelchair Mobility    Modified Rankin (Stroke Patients Only)       Balance Overall balance assessment: Needs assistance Sitting-balance support: No upper extremity supported, Feet supported  Sitting  balance-Leahy Scale: Good     Standing balance support: No upper extremity supported, During functional activity Standing balance-Leahy Scale: Fair Standing balance comment: no external support required                            Cognition Arousal/Alertness: Awake/alert Behavior During Therapy: WFL for tasks assessed/performed Overall Cognitive Status: Within Functional Limits for tasks assessed                                 General Comments: increased time and effort required with mobility efforts. she is able to follow single step commands consistently        Exercises      General Comments        Pertinent Vitals/Pain Pain Assessment Pain Assessment: No/denies pain    Home Living                          Prior Function            PT Goals (current goals can now be found in the care plan section) Acute Rehab PT Goals Patient Stated Goal: to get stronger and go home PT Goal Formulation: With patient Time For Goal Achievement: 10/31/21 Potential to Achieve Goals: Good Progress towards PT goals: Progressing toward goals    Frequency    Min 2X/week      PT Plan Current plan remains appropriate    Co-evaluation              AM-PAC PT "6 Clicks" Mobility   Outcome Measure  Help needed turning from your back to your side while in a flat bed without using bedrails?: None Help needed moving from lying on your back to sitting on the side of a flat bed without using bedrails?: None Help needed moving to and from a bed to a chair (including a wheelchair)?: A Little Help needed standing up from a chair using your arms (e.g., wheelchair or bedside chair)?: A Little Help needed to walk in hospital room?: A Little Help needed climbing 3-5 steps with a railing? : A Little 6 Click Score: 20    End of Session Equipment Utilized During Treatment: Gait belt Activity Tolerance: Patient tolerated treatment well Patient left:  in bed;with call bell/phone within reach Nurse Communication: Mobility status (blood pressure) PT Visit Diagnosis: Unsteadiness on feet (R26.81);Other abnormalities of gait and mobility (R26.89);Repeated falls (R29.6);Muscle weakness (generalized) (M62.81);History of falling (Z91.81);Difficulty in walking, not elsewhere classified (R26.2)     Time: 2683-4196 PT Time Calculation (min) (ACUTE ONLY): 19 min  Charges:  $Gait Training: 8-22 mins                     Robin Jensen, PT, MPT    Robin Jensen 10/19/2021, 9:53 AM

## 2021-10-19 NOTE — Progress Notes (Signed)
[facility] Progress Note  Patient: Robin Jensen    KVQ:[id number]  DOA: 10/16/2021    Date of Service: the patient was seen and examined on 10/19/2021  Brief hospital course: 61 year old female with past medical history of bipolar disorder, hypertension, hypothyroidism, seizure disorder and previous CVA presented to the emergency room on 6/25 after a syncopal event witnessed by family.  In the emergency room, found to be in new atrial flutter and cardiology consulted.  Patient brought in for further evaluation.  Patient underwent echocardiogram which noted only grade 1 diastolic dysfunction.  A Lexiscan test was unremarkable.  Assessment and Plan: Assessment and Plan: * Acute on chronic diastolic CHF (congestive heart failure) (HCC) Blood pressure remaining persistently elevated despite multiple medications plus hydralazine.  Have given IV Lasix and patient is diuresed over 4 L and is more than -2 L deficient.  Syncope Probably multifactorial, new atrial flutter in setting of dehydration, orthostasis, autonomic dysfunction. Echocardiogram no major findings Lexiscan stress test 06/26: Low risk study. normal without evidence of significant ischemia or scar, LVEF >65%, no significant coronary calcification   Uncontrolled hypertension Labile BP, orthostasis initially Hydralazine as needed Restart home medications, adjust per cardiology With IV fluids given, blood pressure has been increasing, likely secondary to acute diastolic heart failure.  Gently diuresing.  Hypokalemia repleted Monitor am BMP  Prolonged QT interval Have consulted pharmacy to do a detailed medication review to assess for potential changes Discussed with patient about changing meds w/ plan for her to follow outpatient, vs consult neurology +/- psychiatry here to adjust meds --> never been on olanzapine, this is reasonable alternative to risperidone, will trial this   Typical absence seizure ([place]) Continue  home medications Lamictal and Dilantin levels Dilantin slightly subtherapeutic, repeat level was normal today 10/18/21   Hypothyroidism Continue synthroid TSH WNL  Mental health problem: History of schizoaffective, bipolar 1, depression, anxiety Continue home BuSpar, Valium, Pristiq, Cogentin, Lamictal, Ingrezza Switched Risperdal to Zyprexa d/t concern for long QT, confirmed pt has never been on Zyprexa/olanzapine in the past so no known intolerance   UTI (urinary tract infection) Urine cultures growing out Citrobacter and strep viridans, both sensitive to Rocephin.  We will continue  Headache disorder migrain history On EMgality       Body mass index is 33.62 kg/m.        Consultants: Cardiology  Procedures: Echocardiogram noting grade 1 diastolic dysfunction Lexiscan noting low risk  Antimicrobials: IV Rocephin 6/25-6/29   Code Status: Full code   Subjective: Patient complains of fatigue  Objective: Noted elevated blood pressures Vitals:   10/19/21 1157 10/19/21 1540  BP: (!) 187/96 (!) 169/80  Pulse: 69 66  Resp: 18 18  Temp: 97.7 F (36.5 C) 97.6 F (36.4 C)  SpO2: 98% 99%    Intake/Output Summary (Last 24 hours) at 10/19/2021 1811 Last data filed at 10/19/2021 1539 Gross per 24 hour  Intake 340 ml  Output 1450 ml  Net -1110 ml   Filed Weights   10/16/21 1600 10/17/21 0500  Weight: 82.1 kg 80.7 kg   Body mass index is 33.62 kg/m.  Exam:  General: Alert and oriented x3, no acute distress HEENT: Normocephalic, atraumatic, mucous membranes are moist Cardiovascular: Regular rate and rhythm, S1-S2 Respiratory: Clear to auscultation bilaterally Abdomen: Soft, nontender, nondistended, positive bowel sounds Musculoskeletal: No clubbing or cyanosis, trace pitting edema Skin: No skin breaks, tears or lesions.  Patient is noted to have some morbilliform rash on face Psychiatry: Appropriate, no evidence  of psychoses Neurology: No focal  deficits  Data Reviewed: Hemoglobin at 10.5  Disposition:  Status is: Inpatient Control of blood pressure and diuresis    Anticipated discharge date: 6/29  Family Communication: Left message for family DVT Prophylaxis: heparin injection 5,000 Units Start: 10/16/21 2200 TED hose Start: 10/16/21 2027    Author: Annita Brod ,MD 10/19/2021 6:11 PM  To reach On-call, see care teams to locate the attending and reach out via [email or web address]. Between 7PM-7AM, please contact night-coverage If you still have difficulty reaching the attending provider, please page the [facility] (Director on Call) for [facility] on amion for assistance.

## 2021-10-19 NOTE — Progress Notes (Addendum)
Progress Note  Patient Name: Robin Jensen Date of Encounter: 10/19/2021  [facility] Cardiologist: Nelva Bush, MD   Subjective   Denies chest pain or shortness of breath.  Denies dizziness or further episode of passing out.  Denies palpitations.  Inpatient Medications    Scheduled Meds:  atorvastatin  40 mg Oral QHS   benztropine  2 mg Oral Daily   busPIRone  10 mg Oral TID   desvenlafaxine  150 mg Oral QHS   diazepam  5 mg Oral QHS   donepezil  10 mg Oral QHS   gabapentin  300 mg Oral QHS   heparin  5,000 Units Subcutaneous Q8H   hydrALAZINE  100 mg Oral TID   labetalol  300 mg Oral BID   lamoTRIgine  200 mg Oral Daily   levothyroxine  75 mcg Oral Q0600   linaclotide  72 mcg Oral QAC breakfast   losartan  100 mg Oral Daily   OLANZapine  2.5 mg Oral QHS   pantoprazole  40 mg Oral BID   phenytoin  400 mg Oral Daily   pneumococcal 23 valent vaccine  0.5 mL Intramuscular Tomorrow-1000   sodium chloride flush  3 mL Intravenous Q12H   valbenazine  80 mg Oral QHS   verapamil  120 mg Oral q morning   Continuous Infusions:  cefTRIAXone (ROCEPHIN)  IV Stopped (10/18/21 1917)   PRN Meds: acetaminophen **OR** acetaminophen, cyclobenzaprine, hydrALAZINE, loperamide   Vital Signs    Vitals:   10/18/21 2005 10/18/21 2317 10/19/21 0335 10/19/21 0809  BP: (!) 152/97 (!) 157/116 (!) 159/95 (!) 192/92  Pulse: 76 74 70 68  Resp: '19 18 17 17  '$ Temp: 98 F (36.7 C) 98 F (36.7 C) 97.8 F (36.6 C) (!) 97.5 F (36.4 C)  TempSrc:  Oral  Oral  SpO2: 97% 97% 97%   Weight:      Height:        Intake/Output Summary (Last 24 hours) at 10/19/2021 1054 Last data filed at 10/19/2021 1031 Gross per 24 hour  Intake 340 ml  Output 500 ml  Net -160 ml      10/17/2021    5:00 AM 10/16/2021    4:00 PM 08/27/2021    8:44 PM  Last 3 Weights  Weight (lbs) 177 lb 14.6 oz 181 lb 179 lb 14.3 oz  Weight (kg) 80.7 kg 82.101 kg 81.6 kg      Telemetry    Sinus rhythm-  Personally Reviewed  ECG     - Personally Reviewed  Physical Exam   GEN: No acute distress.   Neck: No JVD Cardiac: RRR, no murmurs, rubs, or gallops.  Respiratory: Clear to auscultation bilaterally. GI: Soft, nontender, non-distended  MS: No edema; No deformity. Neuro:  Nonfocal  Psych: Normal affect   Labs    High Sensitivity Troponin:   Recent Labs  Lab 10/16/21 1614  TROPONINIHS 10     Chemistry Recent Labs  Lab 10/16/21 1614 10/16/21 2114 10/17/21 0550 10/18/21 0642 10/19/21 0634  NA 136  --  140 142 139  K 3.2*  --  3.0* 3.4* 3.5  CL 103  --  107 108 106  CO2 25  --  '26 25 26  '$ GLUCOSE 115*  --  82 112* 99  BUN 21  --  '13 9 12  '$ CREATININE 1.45*   < > 0.81 1.01* 1.05*  CALCIUM 8.9  --  8.6* 8.8* 8.9  MG 1.9  --   --  2.1  --   PROT 6.6  --  5.9*  --   --   ALBUMIN 3.5  --  3.1*  --   --   AST 17  --  14*  --   --   ALT 15  --  15  --   --   ALKPHOS 90  --  80  --   --   BILITOT 0.5  --  0.6  --   --   GFRNONAA 41*   < > >60 >60 >60  ANIONGAP 8  --  '7 9 7   '$ < > = values in this interval not displayed.    Lipids No results for input(s): "CHOL", "TRIG", "HDL", "LABVLDL", "LDLCALC", "CHOLHDL" in the last 168 hours.  Hematology Recent Labs  Lab 10/17/21 0550 10/18/21 0642 10/19/21 0634  WBC 6.7 5.5 6.2  RBC 4.11 4.22 3.84*  HGB 11.3* 11.7* 10.5*  HCT 35.1* 36.2 32.6*  MCV 85.4 85.8 84.9  MCH 27.5 27.7 27.3  MCHC 32.2 32.3 32.2  RDW 13.8 14.0 14.1  PLT 171 160 165   Thyroid  Recent Labs  Lab 10/16/21 2114  TSH 4.097    BNPNo results for input(s): "BNP", "PROBNP" in the last 168 hours.  DDimer  Recent Labs  Lab 10/16/21 1915  DDIMER 0.55*     Radiology    NM Myocar Multi W/Spect W/Wall Motion / EF  Result Date: 10/17/2021   Normal pharmacologic myocardial perfusion stress test without evidence of significant ischemia or scar.   Normal left ventricular systolic function (LVEF > 65%).   There is no significant coronary artery  calcification.   Aortic atherosclerosis is noted on the attenuation correction CT.   Allowing for differences in technique, there is no significant change compared to the prior study from 07/15/2010.   This is a low risk study.    Cardiac Studies   Lexiscan myoview 10/17/2021   Normal pharmacologic myocardial perfusion stress test without evidence of significant ischemia or scar.   Normal left ventricular systolic function (LVEF > 65%).   There is no significant coronary artery calcification.   Aortic atherosclerosis is noted on the attenuation correction CT.   Allowing for differences in technique, there is no significant change compared to the prior study from 07/15/2010.   This is a low risk study  Echo 10/17/2021 1. Left ventricular ejection fraction, by estimation, is 60 to 65%. The  left ventricle has normal function. The left ventricle has no regional  wall motion abnormalities. There is mild left ventricular hypertrophy.  Left ventricular diastolic parameters  are consistent with Grade I diastolic dysfunction (impaired relaxation).  Elevated left atrial pressure.   2. Right ventricular systolic function is normal. The right ventricular  size is normal. Mildly increased right ventricular wall thickness.  Tricuspid regurgitation signal is inadequate for assessing PA pressure.   3. Left atrial size was mildly dilated.   4. The mitral valve is abnormal. Trivial mitral valve regurgitation.   5. The aortic valve is tricuspid. Aortic valve regurgitation is not  visualized. No aortic stenosis is present.   6. The inferior vena cava is normal in size with greater than 50%  respiratory variability, suggesting right atrial pressure of 3 mmHg.   Patient Profile     61 y.o. female with history of hypertension, hyperlipidemia, bipolar disorder presenting due to syncope, etiology possibly orthostasis.  Assessment & Plan    Syncope -Etiology includes orthostasis, intravascular volume  depletion -Echo grossly normal,  EF 55%, Myoview with no ischemia. -No significant arrhythmias noted on telemetry monitoring -Recommend cardiac monitor placement /Zio patch upon discharge. -No additional cardiac testing planned this admission  2.  Hypertension -BP elevated -Verapamil increased to 240 mg daily -Continue PTA hydralazine, losartan, -Titrate verapamil as needed for adequate BP control. -Holding HCTZ due to ?orthostasis in #1  3.  Bipolar disorder -PTA medications, management as per primary team  4.  UTI -Antibiotics per IM  Follow-up as outpatient recommended, place cardiac monitor upon discharge.  No additional cardiac testing or intervention planned during this admission.  Total encounter time more than 50 minutes  Greater than 50% was spent in counseling and coordination of care with the patient      Signed, Kate Sable, MD  10/19/2021, 10:54 AM

## 2021-10-19 NOTE — Hospital Course (Signed)
61 year old female with past medical history of bipolar disorder, hypertension, hypothyroidism, seizure disorder and previous CVA presented to the emergency room on 6/25 after a syncopal event witnessed by family.  In the emergency room, found to be in new atrial flutter and cardiology consulted.  Patient brought in for further evaluation.  Patient underwent echocardiogram which noted only grade 1 diastolic dysfunction.  A Lexiscan test was unremarkable.

## 2021-10-19 NOTE — Consult Note (Addendum)
Donepezil has a known risk of Qtc/tdP.  Risperidone switched to olanzapine, has olanzapine has lower risk of Qtc/TdP Valbenazine has a possible risk of Qtc/TdP  Loperamide has a risk of Qtc/TdP, but if used is excessively.   Recommend maintaining potassium > 4.   Recommend consulting Psych and neuro of the use of donepezil. Pt is on it due to memory lost issues. No note of alzheimer disease but patient has note of dementia due to another medical condition listed per chart review.   Eleonore Chiquito, PharmD, BCPS

## 2021-10-20 ENCOUNTER — Inpatient Hospital Stay (HOSPITAL_COMMUNITY)
Admit: 2021-10-20 | Discharge: 2021-10-20 | Disposition: A | Discharge status: Home / Self Care | Payer: Medicare Other | Attending: Physician Assistant | Admitting: Physician Assistant

## 2021-10-20 DIAGNOSIS — N3000 Acute cystitis without hematuria: Secondary | ICD-10-CM | POA: Diagnosis not present

## 2021-10-20 DIAGNOSIS — R9431 Abnormal electrocardiogram [ECG] [EKG]: Secondary | ICD-10-CM | POA: Diagnosis not present

## 2021-10-20 DIAGNOSIS — R55 Syncope and collapse: Secondary | ICD-10-CM | POA: Diagnosis not present

## 2021-10-20 DIAGNOSIS — I5033 Acute on chronic diastolic (congestive) heart failure: Secondary | ICD-10-CM | POA: Diagnosis not present

## 2021-10-20 LAB — BASIC METABOLIC PANEL
Anion gap: 7 (ref 5–15)
BUN: 16 mg/dL (ref 8–23)
CO2: 28 mmol/L (ref 22–32)
Calcium: 8.8 mg/dL — ABNORMAL LOW (ref 8.9–10.3)
Chloride: 100 mmol/L (ref 98–111)
Creatinine, Ser: 1.06 mg/dL — ABNORMAL HIGH (ref 0.44–1.00)
GFR, Estimated: 60 mL/min — ABNORMAL LOW (ref >60)
Glucose, Bld: 93 mg/dL (ref 70–99)
Potassium: 3.2 mmol/L — ABNORMAL LOW (ref 3.5–5.1)
Sodium: 135 mmol/L (ref 135–145)

## 2021-10-20 LAB — CBC
HCT: 31.6 % — ABNORMAL LOW (ref 36.0–46.0)
Hemoglobin: 10.5 g/dL — ABNORMAL LOW (ref 12.0–15.0)
MCH: 27.9 pg (ref 26.0–34.0)
MCHC: 33.2 g/dL (ref 30.0–36.0)
MCV: 84 fL (ref 80.0–100.0)
Platelets: 178 10*3/uL (ref 150–400)
RBC: 3.76 MIL/uL — ABNORMAL LOW (ref 3.87–5.11)
RDW: 14 % (ref 11.5–15.5)
WBC: 7 10*3/uL (ref 4.0–10.5)
nRBC: 0 % (ref 0.0–0.2)

## 2021-10-20 MED ORDER — POTASSIUM CHLORIDE CRYS ER 20 MEQ PO TBCR
40.0000 meq | EXTENDED_RELEASE_TABLET | Freq: Once | ORAL | Status: AC
Start: 2021-10-20 — End: 2021-10-20
  Administered 2021-10-20: 40 meq via ORAL
  Filled 2021-10-20: qty 2

## 2021-10-20 MED ORDER — VERAPAMIL HCL ER 240 MG PO TBCR: 240.0000 mg | EXTENDED_RELEASE_TABLET | Freq: Every morning | ORAL | 2 refills | Status: AC

## 2021-10-20 MED ORDER — OLANZAPINE 2.5 MG PO TABS: 2.5000 mg | ORAL_TABLET | Freq: Every day | ORAL | 2 refills | Status: AC

## 2021-10-20 NOTE — Discharge Summary (Signed)
Physician Discharge Summary   Patient: Robin Jensen MRN: [id number] DOB: [birth year]  Admit date:     10/16/2021  Discharge date: 10/20/21  Discharge Physician: Annita Brod   PCP: Donnie Coffin, MD   Recommendations at discharge:   Verapamil increased from 120 to 240 mg daily Medication clarification: Patient had been on Cozaar 100 mg and the day of hospitalization, had had her medication changed to Hyzaar 100/25.  On her instructions it reads to stop her Cozaar and formally start Hyzaar. Medication change: Risperdal discontinued due to concerns for QT prolongation New medication: Zyprexa 2.5 mg nightly Patient discharged with a Zio patch and will follow up with cardiology  Discharge Diagnoses: Principal Problem:   Acute on chronic diastolic CHF (congestive heart failure) (HCC) Active Problems:   Syncope   Hypokalemia   Uncontrolled hypertension   Prolonged QT interval   Hypothyroidism   Typical absence seizure (HCC)   UTI (urinary tract infection)   Mental health problem: History of schizoaffective, bipolar 1, depression, anxiety   Headache disorder  Resolved Problems:   * No resolved hospital problems. *  Hospital Course: 61 year old female with past medical history of bipolar disorder, hypertension, hypothyroidism, seizure disorder and previous CVA presented to the emergency room on 6/25 after a syncopal event witnessed by family.  In the emergency room, found to be in new atrial flutter and cardiology consulted.  Patient brought in for further evaluation.  Patient underwent echocardiogram which noted only grade 1 diastolic dysfunction.  A Lexiscan test was unremarkable.  Assessment and Plan: * Acute on chronic diastolic CHF (congestive heart failure) (HCC) Blood pressure remaining persistently elevated despite multiple medications plus hydralazine.  Have given IV Lasix and by time of discharge, patient had diuresed over 6 L and was not more than -3 L  deficient.  Syncope Probably multifactorial, new atrial flutter in setting of dehydration, orthostasis, autonomic dysfunction. Echocardiogram no major findings Lexiscan stress test 06/26: Low risk study. normal without evidence of significant ischemia or scar, LVEF >65%, no significant coronary calcification  Cardiology applied Zio patch prior to discharge and they will follow-up with her as outpatient  Uncontrolled hypertension Labile BP, orthostasis initially Hydralazine as needed Restart home medications, adjust per cardiology specifically increasing verapamil Following diuresis, blood pressure much more manageable  Hypokalemia Replaced as needed  Prolonged QT interval Have consulted pharmacy to do a detailed medication review to assess for potential changes Discussed with patient and she was amenable to changing her Risperdal to Zyprexa  Typical absence seizure ([place]) Continue home medications Lamictal and Dilantin levels Dilantin slightly subtherapeutic, repeat level was normal 10/18/21   Hypothyroidism Continue synthroid TSH WNL  Mental health problem: History of schizoaffective, bipolar 1, depression, anxiety Continue home BuSpar, Valium, Pristiq, Cogentin, Lamictal, Ingrezza Switched Risperdal to Zyprexa d/t concern for long QT, confirmed pt has never been on Zyprexa/olanzapine in the past so no known intolerance   UTI (urinary tract infection) Urine cultures growing out Citrobacter and strep viridans, both sensitive to Rocephin.  Completed 3 days of IV antibiotics  Headache disorder migrain history On EMgality         Consultants: Cardiology Procedures performed: Lexiscan, echocardiogram Disposition: Home Diet recommendation:  Discharge Diet Orders (From admission, onward)     Start     Ordered   10/20/21 0000  Diet - low sodium heart healthy        10/20/21 1146           Cardiac diet DISCHARGE MEDICATION:  Allergies as of 10/20/2021        Reactions   Bee Venom Anaphylaxis   Penicillins Shortness Of Breath, Rash   Latex    Lisinopril    Other    Sulfa Antibiotics         Medication List     STOP taking these medications    cyclobenzaprine 10 MG tablet Commonly known as: FLEXERIL   losartan 100 MG tablet Commonly known as: COZAAR   risperiDONE 0.5 MG tablet Commonly known as: RISPERDAL       TAKE these medications    atorvastatin 40 MG tablet Commonly known as: LIPITOR Take 40 mg by mouth at bedtime.   benztropine 2 MG tablet Commonly known as: COGENTIN Take 2 mg by mouth daily.   busPIRone 10 MG tablet Commonly known as: BUSPAR Take 10 mg by mouth 3 (three) times daily.   butalbital-acetaminophen-caffeine 50-325-40 MG tablet Commonly known as: FIORICET Take 1 tablet by mouth daily as needed.   desvenlafaxine 100 MG 24 hr tablet Commonly known as: PRISTIQ Take 150 mg by mouth at bedtime.   desvenlafaxine 50 MG 24 hr tablet Commonly known as: PRISTIQ Take 50 mg by mouth at bedtime.   diazepam 5 MG tablet Commonly known as: VALIUM Take 5 mg by mouth at bedtime.   donepezil 10 MG tablet Commonly known as: ARICEPT Take 10 mg by mouth at bedtime.   Emgality 120 MG/ML Sosy Generic drug: Galcanezumab-gnlm Inject 120 mg into the skin every 28 (twenty-eight) days.   EPINEPHrine 0.3 mg/0.3 mL Soaj injection Commonly known as: EPI-PEN Inject 0.3 mg into the muscle as needed. For anaphylactic reaction   gabapentin 400 MG capsule Commonly known as: NEURONTIN Take 400 mg by mouth 3 (three) times daily. What changed: Another medication with the same name was removed. Continue taking this medication, and follow the directions you see here.   hydrALAZINE 100 MG tablet Commonly known as: APRESOLINE Take 1 tablet (100 mg total) by mouth 3 (three) times daily.   Ingrezza 80 MG capsule Generic drug: valbenazine Take 80 mg by mouth at bedtime.   Lorayne Bender Sustenna 234 MG/1.5ML injection Generic  drug: paliperidone Inject 234 mg into the muscle every 30 (thirty) days.   labetalol 300 MG tablet Commonly known as: NORMODYNE Take 1 tablet (300 mg total) by mouth 2 (two) times daily.   lamoTRIgine 200 MG tablet Commonly known as: LAMICTAL Take 200 mg by mouth daily.   levothyroxine 75 MCG tablet Commonly known as: SYNTHROID Take 1 tablet (75 mcg total) by mouth daily before breakfast.   linaclotide 72 MCG capsule Commonly known as: LINZESS Take 72 mcg by mouth daily before breakfast.   losartan-hydrochlorothiazide 100-25 MG tablet Commonly known as: HYZAAR Take 1 tablet by mouth daily.   OLANZapine 2.5 MG tablet Commonly known as: ZYPREXA Take 1 tablet (2.5 mg total) by mouth at bedtime.   pantoprazole 40 MG tablet Commonly known as: PROTONIX Take 1 tablet (40 mg total) by mouth 2 (two) times daily.   phenytoin 200 MG ER capsule Commonly known as: DILANTIN Take 400 mg by mouth daily.   verapamil 240 MG CR tablet Commonly known as: CALAN-SR Take 1 tablet (240 mg total) by mouth every morning. Start taking on: October 21, 2021 What changed:  medication strength how much to take        Follow-up Information     Agbor-Etang, Aaron Edelman, MD. Go in 1 month(s).   Specialties: Cardiology, Radiology Why: Appointment on 12/19/21 at  9:40am. You have been added to a call back list if any cancellations occur. Contact information: [place] [address] [phone]                Discharge Exam: Danley Danker Weights   10/16/21 1600 10/17/21 0500 10/20/21 0344  Weight: 82.1 kg 80.7 kg 80.6 kg   General: Alert and oriented x3, no acute distress Cardiovascular: Regular rate and rhythm, S1-S2 Lungs: Clear auscultation bilaterally  Condition at discharge: good  The results of significant diagnostics from this hospitalization (including imaging, microbiology, ancillary and laboratory) are listed below for reference.   Imaging Studies: NM Myocar Multi  W/Spect W/Wall Motion / EF  Result Date: 10/17/2021   Normal pharmacologic myocardial perfusion stress test without evidence of significant ischemia or scar.   Normal left ventricular systolic function (LVEF > 65%).   There is no significant coronary artery calcification.   Aortic atherosclerosis is noted on the attenuation correction CT.   Allowing for differences in technique, there is no significant change compared to the prior study from 07/15/2010.   This is a low risk study.   ECHOCARDIOGRAM COMPLETE  Result Date: 10/17/2021    ECHOCARDIOGRAM REPORT   Patient Name:   ADELAYDE MINNEY Date of Exam: 10/17/2021 Medical Rec #:  [id number]          Height:       61.0 in Accession #:    [id number]         Weight:       177.9 lb Date of Birth:  1960/10/25          BSA:          1.797 m Patient Age:    89 years           BP:           187/94 mmHg Patient Gender: F                  HR:           69 bpm. Exam Location:  [facility] Procedure: 2D Echo, Cardiac Doppler and Color Doppler Indications:     Syncope R55  History:         Patient has no prior history of Echocardiogram examinations.                  Risk Factors:Hypertension. Anxiety, DVT.  Sonographer:     Sherrie Sport Referring Phys:  [id number] SARA-MAIZ A THOMAS Diagnosing Phys: Nelva Bush MD  Sonographer Comments: Suboptimal apical window. IMPRESSIONS  1. Left ventricular ejection fraction, by estimation, is 60 to 65%. The left ventricle has normal function. The left ventricle has no regional wall motion abnormalities. There is mild left ventricular hypertrophy. Left ventricular diastolic parameters are consistent with Grade I diastolic dysfunction (impaired relaxation). Elevated left atrial pressure.  2. Right ventricular systolic function is normal. The right ventricular size is normal. Mildly increased right ventricular wall thickness. Tricuspid regurgitation signal is inadequate for assessing PA pressure.  3. Left atrial size was mildly dilated.  4. The  mitral valve is abnormal. Trivial mitral valve regurgitation.  5. The aortic valve is tricuspid. Aortic valve regurgitation is not visualized. No aortic stenosis is present.  6. The inferior vena cava is normal in size with greater than 50% respiratory variability, suggesting right atrial pressure of 3 mmHg. FINDINGS  Left Ventricle: Left ventricular ejection fraction, by estimation, is 60 to 65%. The left ventricle has normal function. The  left ventricle has no regional wall motion abnormalities. The left ventricular internal cavity size was normal in size. There is  mild left ventricular hypertrophy. Left ventricular diastolic parameters are consistent with Grade I diastolic dysfunction (impaired relaxation). Elevated left atrial pressure. Right Ventricle: The right ventricular size is normal. Mildly increased right ventricular wall thickness. Right ventricular systolic function is normal. Tricuspid regurgitation signal is inadequate for assessing PA pressure. Left Atrium: Left atrial size was mildly dilated. Right Atrium: Right atrial size was normal in size. Pericardium: There is no evidence of pericardial effusion. Mitral Valve: The mitral valve is abnormal. Mild mitral annular calcification. Trivial mitral valve regurgitation. Tricuspid Valve: The tricuspid valve is not well visualized. Tricuspid valve regurgitation is trivial. Aortic Valve: The aortic valve is tricuspid. Aortic valve regurgitation is not visualized. No aortic stenosis is present. Aortic valve mean gradient measures 3.0 mmHg. Aortic valve peak gradient measures 6.2 mmHg. Aortic valve area, by VTI measures 3.22 cm. Pulmonic Valve: The pulmonic valve was not well visualized. Pulmonic valve regurgitation is not visualized. No evidence of pulmonic stenosis. Aorta: The aortic root is normal in size and structure. Pulmonary Artery: The pulmonary artery is of normal size. Venous: The inferior vena cava is normal in size with greater than 50%  respiratory variability, suggesting right atrial pressure of 3 mmHg. IAS/Shunts: The interatrial septum was not well visualized.  LEFT VENTRICLE PLAX 2D LVIDd:         4.80 cm   Diastology LVIDs:         3.10 cm   LV e' medial:    5.33 cm/s LV PW:         0.90 cm   LV E/e' medial:  18.6 LV IVS:        1.10 cm   LV e' lateral:   4.13 cm/s LVOT diam:     2.00 cm   LV E/e' lateral: 24.0 LV SV:         79 LV SV Index:   44 LVOT Area:     3.14 cm  RIGHT VENTRICLE RV S prime:     16.30 cm/s TAPSE (M-mode): 2.1 cm LEFT ATRIUM             Index        RIGHT ATRIUM           Index LA diam:        3.50 cm 1.95 cm/m   RA Area:     14.60 cm LA Vol (A2C):   71.5 ml 39.79 ml/m  RA Volume:   33.00 ml  18.36 ml/m LA Vol (A4C):   53.2 ml 29.60 ml/m LA Biplane Vol: 66.9 ml 37.23 ml/m  AORTIC VALVE AV Area (Vmax):    3.17 cm AV Area (Vmean):   2.97 cm AV Area (VTI):     3.22 cm AV Vmax:           124.00 cm/s AV Vmean:          79.250 cm/s AV VTI:            0.245 m AV Peak Grad:      6.2 mmHg AV Mean Grad:      3.0 mmHg LVOT Vmax:         125.00 cm/s LVOT Vmean:        74.900 cm/s LVOT VTI:          0.251 m LVOT/AV VTI ratio: 1.02  AORTA Ao Root diam: 3.67 cm MITRAL VALVE  MV Area (PHT): 2.62 cm     SHUNTS MV Decel Time: 290 msec     Systemic VTI:  0.25 m MV E velocity: 99.00 cm/s   Systemic Diam: 2.00 cm MV A velocity: 132.00 cm/s MV E/A ratio:  0.75 Nelva Bush MD Electronically signed by Nelva Bush MD Signature Date/Time: 10/17/2021/11:10:55 AM    Final    DG Chest 2 View  Result Date: 10/16/2021 CLINICAL DATA:  weakness, syncope EXAM: CHEST - 2 VIEW COMPARISON:  12/24/2020 FINDINGS: The heart size and mediastinal contours are within normal limits. Both lungs are clear. The visualized skeletal structures are unremarkable. IMPRESSION: Negative. Electronically Signed   By: Rolm Baptise M.D.   On: 10/16/2021 18:09   CT HEAD WO CONTRAST (5MM)  Result Date: 10/16/2021 CLINICAL DATA:  Mental status change. EXAM:  CT HEAD WITHOUT CONTRAST TECHNIQUE: Contiguous axial images were obtained from the base of the skull through the vertex without intravenous contrast. RADIATION DOSE REDUCTION: This exam was performed according to the departmental dose-optimization program which includes automated exposure control, adjustment of the mA and/or kV according to patient size and/or use of iterative reconstruction technique. 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 The ventricles are in the midline without mass effect or shift. They are normal in size and configuration. No extra-axial fluid collections are identified. No CT findings for acute hemispheric infarction or intracranial hemorrhage. Vascular: Scattered vascular calcifications.  No hyperdense vessels. Skull: No skull fracture bone lesions.  Stable surgical changes. Sinuses/Orbits: The paranasal sinuses and mastoid air cells clear. The globes are intact. Other: No scalp lesions or scalp hematoma. IMPRESSION: 1. 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 2. No acute intracranial findings or mass lesions. Electronically Signed   By: Marijo Sanes M.D.   On: 10/16/2021 17:44    Microbiology: Results for orders placed or performed during the hospital encounter of 10/16/21  Urine Culture     Status: Abnormal   Collection Time: 10/16/21  4:14 PM   Specimen: Urine, Clean Catch  Result Value Ref Range Status   Specimen Description   Final    URINE, CLEAN CATCH Performed at [facility], [address], [place], [place] [address]    Special Requests   Final    NONE Performed at [facility], [place], [place] [address]    Culture (A)  Final    >=100,000 COLONIES/mL CITROBACTER SPECIES 50,000 COLONIES/mL VIRIDANS STREPTOCOCCUS    Report Status 10/19/2021 FINAL  Final   Organism ID, Bacteria CITROBACTER  SPECIES (A)  Final   Organism ID, Bacteria VIRIDANS STREPTOCOCCUS (A)  Final      Susceptibility   Citrobacter species - MIC*    CEFAZOLIN >=64 RESISTANT Resistant     CEFEPIME <=0.12 SENSITIVE Sensitive     CEFTRIAXONE <=0.25 SENSITIVE Sensitive     CIPROFLOXACIN <=0.25 SENSITIVE Sensitive     GENTAMICIN <=1 SENSITIVE Sensitive     IMIPENEM <=0.25 SENSITIVE Sensitive     NITROFURANTOIN <=16 SENSITIVE Sensitive     TRIMETH/SULFA <=20 SENSITIVE Sensitive     PIP/TAZO <=4 SENSITIVE Sensitive     * >=100,000 COLONIES/mL CITROBACTER SPECIES   Viridans streptococcus - MIC*    PENICILLIN <=0.06 SENSITIVE Sensitive     CEFTRIAXONE <=0.12 SENSITIVE Sensitive     ERYTHROMYCIN <=0.12 SENSITIVE Sensitive     LEVOFLOXACIN 2 SENSITIVE Sensitive  VANCOMYCIN 0.5 SENSITIVE Sensitive     * 50,000 COLONIES/mL VIRIDANS STREPTOCOCCUS  Urine Culture     Status: Abnormal   Collection Time: 10/16/21  4:14 PM   Specimen: Urine, Clean Catch  Result Value Ref Range Status   Specimen Description   Final    URINE, CLEAN CATCH Performed at [facility], [address] [place], [place] [address]    Special Requests   Final    NONE Performed at [facility], [address] [place], [place] [address]    Culture >=100,000 COLONIES/mL CITROBACTER SPECIES (A)  Final   Report Status 10/19/2021 FINAL  Final   Organism ID, Bacteria CITROBACTER SPECIES (A)  Final      Susceptibility   Citrobacter species - MIC*    CEFAZOLIN >=64 RESISTANT Resistant     CEFEPIME <=0.12 SENSITIVE Sensitive     CEFTRIAXONE <=0.25 SENSITIVE Sensitive     CIPROFLOXACIN <=0.25 SENSITIVE Sensitive     GENTAMICIN <=1 SENSITIVE Sensitive     IMIPENEM <=0.25 SENSITIVE Sensitive     NITROFURANTOIN <=16 SENSITIVE Sensitive     TRIMETH/SULFA <=20 SENSITIVE Sensitive     PIP/TAZO <=4 SENSITIVE Sensitive     * >=100,000 COLONIES/mL CITROBACTER SPECIES    Labs: CBC: Recent Labs  Lab 10/16/21 2114  10/17/21 0550 10/18/21 0642 10/19/21 0634 10/20/21 0508  WBC 8.1 6.7 5.5 6.2 7.0  HGB 11.6* 11.3* 11.7* 10.5* 10.5*  HCT 36.1 35.1* 36.2 32.6* 31.6*  MCV 84.9 85.4 85.8 84.9 84.0  PLT 198 171 160 165 983   Basic Metabolic Panel: Recent Labs  Lab 10/16/21 1614 10/16/21 2114 10/17/21 0550 10/18/21 0642 10/19/21 0634 10/20/21 0508  NA 136  --  140 142 139 135  K 3.2*  --  3.0* 3.4* 3.5 3.2*  CL 103  --  107 108 106 100  CO2 25  --  '26 25 26 28  '$ GLUCOSE 115*  --  82 112* 99 93  BUN 21  --  '13 9 12 16  '$ CREATININE 1.45* 1.11* 0.81 1.01* 1.05* 1.06*  CALCIUM 8.9  --  8.6* 8.8* 8.9 8.8*  MG 1.9  --   --  2.1  --   --    Liver Function Tests: Recent Labs  Lab 10/16/21 1614 10/17/21 0550  AST 17 14*  ALT 15 15  ALKPHOS 90 80  BILITOT 0.5 0.6  PROT 6.6 5.9*  ALBUMIN 3.5 3.1*   CBG: Recent Labs  Lab 10/17/21 1212 10/17/21 1558 10/17/21 2058 10/18/21 0754 10/19/21 0805  GLUCAP 96 165* 109* 108* 98    Discharge time spent: less than 30 minutes.  Signed: Annita Brod, MD [facility] 10/20/2021

## 2021-10-20 NOTE — Care Management Important Message (Signed)
Important Message  Patient Details  Name: Robin Jensen MRN: [id number] Date of Birth: [birth year]   Medicare Important Message Given:  Yes  Asleep upon time of visit.  Copy of Medicare IM left in room for reference.   Dannette Barbara 10/20/2021, 10:01 AM

## 2021-11-14 ENCOUNTER — Telehealth: Payer: Self-pay | Admitting: *Deleted

## 2021-11-14 NOTE — Telephone Encounter (Signed)
-----   Message from Rise Mu, PA-C sent at 11/14/2021  1:05 PM EDT ----- Heart monitor showed a predominant rhythm of sinus with an average rate of 70 bpm (range 51 to 143 bpm, 6 episodes of SVT with the fastest interval lasting just 8 beats and the longest interval lasting 15 seconds.  Rare extra beats from the top and bottom portion of the heart.  No significant arrhythmia to explain syncope.  Follow-up as outpatient as directed.

## 2021-11-14 NOTE — Addendum Note (Signed)
Encounter addended by: Valora Corporal, RN on: 11/14/2021 8:08 AM  Actions taken: Imaging Exam ended

## 2021-11-14 NOTE — Telephone Encounter (Signed)
Left voicemail message to call back for review of results.  

## 2021-11-14 NOTE — Addendum Note (Signed)
Encounter addended by: Valora Corporal, RN on: 11/14/2021 8:05 AM  Actions taken: Imaging Exam ended

## 2021-11-15 ENCOUNTER — Telehealth: Payer: Self-pay | Admitting: Physician Assistant

## 2021-11-15 NOTE — Telephone Encounter (Signed)
Reviewed results with patient and she verbalized understanding with no further questions at this time.  

## 2021-11-15 NOTE — Telephone Encounter (Signed)
Patient returned call for monitor results.

## 2021-11-15 NOTE — Telephone Encounter (Signed)
Duplicate telephone encounter

## 2021-12-19 ENCOUNTER — Ambulatory Visit: Payer: Medicare Other | Admitting: Cardiology

## 2022-01-31 ENCOUNTER — Other Ambulatory Visit: Payer: Self-pay | Admitting: Family Medicine

## 2022-01-31 DIAGNOSIS — Z1231 Encounter for screening mammogram for malignant neoplasm of breast: Secondary | ICD-10-CM

## 2022-02-06 ENCOUNTER — Ambulatory Visit: Payer: Medicare Other | Admitting: Cardiology

## 2022-02-17 ENCOUNTER — Ambulatory Visit: Payer: Medicare Other | Admitting: Nurse Practitioner

## 2022-02-17 NOTE — Progress Notes (Deleted)
Cardiology Clinic Note   Patient Name: Robin Jensen Date of Encounter: 02/17/2022  Primary Care Provider:  Donnie Coffin, MD Primary Cardiologist:  Nelva Bush, MD  Patient Profile    Robin Jensen, Robin Jensen is a 61 year-old female with a past medical history of chronic diastolic CHF, hypertension, hyperlipidemia, and hypothyroid who presents to the clinic today for hospital follow-up. 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 ON meds   Stroke ([place]) 1997   No deficits   Suicidal ideation    attempted overdoses in past   Past Surgical History:  Procedure Laterality Date   ABDOMINAL HYSTERECTOMY     CEREBRAL ANEURYSM REPAIR  1997   CESAREAN SECTION     CHOLECYSTECTOMY     COLONOSCOPY WITH PROPOFOL N/A 08/20/2012   Procedure: COLONOSCOPY WITH PROPOFOL;  Surgeon: [name], MD;  Location: [facility];  Service: Endoscopy;  Laterality: N/A;  In cecum at [place]; Total Withdrawal Time= 12 minutes   COLONOSCOPY WITH PROPOFOL N/A 09/28/2017   Procedure: COLONOSCOPY WITH PROPOFOL;  Surgeon: [name], MD;  Location: [facility] ENDOSCOPY;  Service: Endoscopy;  Laterality: N/A;   ESOPHAGOGASTRODUODENOSCOPY (EGD) WITH PROPOFOL N/A 11/20/2016   Procedure: ESOPHAGOGASTRODUODENOSCOPY (EGD) WITH PROPOFOL;  Surgeon: [name], MD;  Location: [facility] ENDOSCOPY;  Service: Endoscopy;  Laterality: N/A;   ESOPHAGOGASTRODUODENOSCOPY (EGD) WITH PROPOFOL N/A 09/28/2017   Procedure: ESOPHAGOGASTRODUODENOSCOPY (EGD) WITH PROPOFOL;   Surgeon: [name], MD;  Location: [facility] ENDOSCOPY;  Service: Endoscopy;  Laterality: N/A;   RADICAL HYSTERECTOMY      Allergies  Allergies  Allergen Reactions   Bee Venom Anaphylaxis   Penicillins Shortness Of Breath and Rash   Latex    Lisinopril    Other    Sulfa Antibiotics     History of Present Illness    Robin Jensen has a past medical history of: Chronic diastolic CHF. Echo 10/17/2021: EF 60 to 65%.  Mild LVH.  Grade I DD.  Mildly increased right ventricular wall thickness.  Mildly dilated left atrium.  Trivial mitral valve regurgitation. Nuclear stress test 10/17/2021: No significant coronary artery calcification.  Aortic atherosclerosis.  Low risk study.  No significant change from prior study 07/15/2010. Syncopal episode June 2023. 14-day ZIO 11/14/2021: Occasional paroxysmal SVT noted.  No significant arrhythmias. Tobacco use disorder. ***  Patient was last seen for an office visit 12/18/2012 by Dr. Percival Spanish.  At that time she was having atypical chest pain.  A dobutamine echo showed no wall motion abnormalities or evidence of ischemia.  She was noted to have prolonged QT on EKG.  Her blood pressure was also low at that time so Cozaar was reduced.  More recently, patient was seen in the emergency room for syncopal event at home.  She was admitted and underwent work-up with  Echo and stress test.  Echo showed EF of 60 to 65% with trivial mitral valve regurgitation.  Nuclear stress test revealed a low risk study with no significant change from prior study in March 2012.  She was discharged with 14-day ZIO which showed some runs of SVT but no significant arrhythmias to account for her syncopal episode.  Today, patient ***  Home Medications    No outpatient medications have been marked as taking for the 02/17/22 encounter (Appointment) with Theora Gianotti, NP.    Family History    Family History  Problem Relation Age of Onset   Colon cancer Father         less than age 44   Colon cancer Paternal Uncle        55s   Colon cancer Paternal Grandfather    Breast cancer Maternal Aunt    Diabetes Sister    Arthritis Brother    Hematuria Mother    Kidney cancer Maternal Grandmother    Kidney disease Maternal Grandmother    Prostate cancer Neg Hx    She indicated that her mother is deceased. She indicated that her father is deceased. She indicated that the status of her sister is unknown. She indicated that the status of her brother is unknown. She indicated that the status of her maternal grandmother is unknown. She indicated that the status of her paternal grandfather is unknown. She indicated that the status of her maternal aunt is unknown. She indicated that the status of her paternal uncle is unknown. She indicated that the status of her neg hx is unknown.   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  Cardiovascular:  No  chest pain, dyspnea on exertion, edema, orthopnea, palpitations, paroxysmal nocturnal dyspnea. Dermatological: No rash, lesions/masses Respiratory: No cough, dyspnea Urologic: No hematuria, dysuria Abdominal:   No nausea, vomiting, diarrhea, bright red blood per rectum, melena, or hematemesis Neurologic:  No visual changes, weakness, changes in mental status. All other systems reviewed and are otherwise negative except as noted above.  Physical Exam    VS:  There were no vitals taken for this visit. , BMI There is no height or weight on file to calculate BMI. GEN: *** Well nourished,  well developed, in no acute distress. HEENT: Normal. Neck: Supple, no JVD, carotid bruits, or masses. Cardiac: RRR, no murmurs, rubs, or gallops. No clubbing, cyanosis, edema.  Radials/DP/PT 2+ and equal bilaterally.  Respiratory:  Respirations regular and unlabored, clear to auscultation bilaterally. GI: Soft, nontender, nondistended. Robin Jensen: No deformity or atrophy. Skin: Warm and dry, no rash. Neuro: Strength and sensation are intact. Psych: Normal affect.  Accessory Clinical Findings    The following studies were reviewed for this visit: 14-day ZIO 11/14/2021: Study highlights Patch Wear Time:  14 days and 0 hours (2023-06-29T11:29:13-0400 to 2023-07-13T11:29:13-0400)   Patient had a min HR of 51 bpm, max HR of 143 bpm, and avg HR of 70 bpm. Predominant underlying rhythm was Sinus Rhythm. 6 Supraventricular Tachycardia runs occurred, the run with the fastest interval lasting 8 beats with a max rate of 143 bpm, the  longest lasting 15.0 secs with an avg rate of 118 bpm. Isolated SVEs were rare (<1.0%), SVE Couplets were rare (<1.0%), and SVE Triplets were rare (<1.0%). Isolated VEs were rare (<1.0%), and no VE Couplets or VE Triplets were present.   Conclusion Occasional paroxysmal SVTs noted, no significant arrhythmias to suggest etiology of syncope.  Recent Labs: 10/16/2021: TSH 4.097 10/17/2021: ALT  15 10/18/2021: Magnesium 2.1 10/19/2021: B Natriuretic Peptide 55.8 10/20/2021: BUN 16; Creatinine, Ser 1.06; Hemoglobin 10.5; Platelets 178; Potassium 3.2; Sodium 135   Recent Lipid Panel    Component Value Date/Time   CHOL 124 08/28/2021 0549   TRIG 146 08/28/2021 0549   HDL 35 (L) 08/28/2021 0549   CHOLHDL 3.5 08/28/2021 0549   VLDL 29 08/28/2021 0549   LDLCALC 60 08/28/2021 0549    No BP recorded.  {Refresh Note OR Click here to enter BP  :1}***    ECG personally reviewed by me today ***  Unchanged from ***  CHA2DS2-VASc Score =    The patient's score is based upon:   {Click here to calculate score.  REFRESH note before signing. :1}      Assessment & Plan   ***   Disposition: ***   [place]. Alvilda Mckenna, NP-C     02/17/2022, 9:59 AM [facility] [address] Office [phone] Fax [phone]   I spent *** minutes examining this patient, reviewing medications, and using patient centered shared decision making involving her cardiac care.  Prior to her visit I spent greater than 20 minutes reviewing her past medical history,  medications, and prior cardiac tests.

## 2022-04-10 ENCOUNTER — Encounter: Payer: Self-pay | Admitting: *Deleted

## 2022-04-13 ENCOUNTER — Ambulatory Visit: Payer: Medicare Other | Admitting: Cardiology

## 2022-04-18 ENCOUNTER — Ambulatory Visit: Payer: Medicare Other | Attending: Cardiology | Admitting: Cardiology

## 2022-04-18 ENCOUNTER — Encounter: Payer: Self-pay | Admitting: Cardiology

## 2022-04-18 VITALS — BP 124/80 | HR 66 | Ht 62.0 in | Wt 169.4 lb

## 2022-04-18 DIAGNOSIS — R55 Syncope and collapse: Secondary | ICD-10-CM

## 2022-04-18 DIAGNOSIS — F17200 Nicotine dependence, unspecified, uncomplicated: Secondary | ICD-10-CM | POA: Diagnosis not present

## 2022-04-18 DIAGNOSIS — E7800 Pure hypercholesterolemia, unspecified: Secondary | ICD-10-CM

## 2022-04-18 DIAGNOSIS — I10 Essential (primary) hypertension: Secondary | ICD-10-CM | POA: Diagnosis not present

## 2022-04-18 NOTE — Progress Notes (Signed)
Cardiology Office Note:    Date:  04/18/2022   ID:  DANNIELA Jensen, DOB 1960-10-05, MRN [id number]  PCP:  Donnie Coffin, MD   [facility] Providers Cardiologist:  Kate Sable, MD     Referring MD: Donnie Coffin, MD   Chief Complaint  Patient presents with   New Patient (Initial Visit)    Establish care, Cardiac Hx     History of Present Illness:    Robin Jensen is a 61 y.o. female with a hx of hypertension, hyperlipidemia, bipolar disorder, current smoker presenting to establish care.  She was recently admitted to the hospital with syncope, etiology included orthostasis versus intravascular volume depletion.  Workup with echocardiogram, Lexiscan Myoview were unrevealing.  Cardiac monitor was placed did not show any significant arrhythmias to suggest etiology of syncope, occasional paroxysmal SVT is noted.  She still has occasional episodes of lightheadedness.  Symptoms are not associated with changing position.  She could be sitting watching TV and suddenly feels dizzy, lasting a few seconds.  She will adjust with the dizziness to pass.  Overall feels well, no other concerns at this time.  Recently lost her mom, started smoking again.  Prior notes/studies Echo 09/2021 EF 60 to 65% Lexiscan Myoview 09/2021 low risk study, no evidence for ischemia Cardiac monitor 10/2021 paroxysmal SVTs, no significant sustained arrhythmias.  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 ON meds   Stroke ([place]) 1997   No deficits    Suicidal ideation    attempted overdoses in past    Past Surgical History:  Procedure Laterality Date   ABDOMINAL HYSTERECTOMY     CEREBRAL ANEURYSM REPAIR  1997   CESAREAN SECTION     CHOLECYSTECTOMY     COLONOSCOPY WITH PROPOFOL N/A 08/20/2012   Procedure: COLONOSCOPY WITH PROPOFOL;  Surgeon: [name], MD;  Location: [facility];  Service: Endoscopy;  Laterality: N/A;  In cecum at [place]; Total Withdrawal Time= 12 minutes   COLONOSCOPY WITH PROPOFOL N/A 09/28/2017   Procedure: COLONOSCOPY WITH PROPOFOL;  Surgeon: [name], MD;  Location: [facility] ENDOSCOPY;  Service: Endoscopy;  Laterality: N/A;   ESOPHAGOGASTRODUODENOSCOPY (EGD) WITH PROPOFOL N/A 11/20/2016   Procedure: ESOPHAGOGASTRODUODENOSCOPY (EGD) WITH PROPOFOL;  Surgeon: [name], MD;  Location: [facility] ENDOSCOPY;  Service: Endoscopy;  Laterality: N/A;   ESOPHAGOGASTRODUODENOSCOPY (EGD) WITH PROPOFOL N/A 09/28/2017   Procedure: ESOPHAGOGASTRODUODENOSCOPY (EGD) WITH PROPOFOL;  Surgeon: [name], MD;  Location: [facility] ENDOSCOPY;  Service: Endoscopy;  Laterality: N/A;   RADICAL HYSTERECTOMY      Current Medications: Current Meds  Medication Sig   atorvastatin (LIPITOR) 40 MG tablet Take 40 mg by mouth at bedtime.    benztropine (COGENTIN) 2 MG tablet Take 2 mg by mouth daily.   busPIRone (BUSPAR) 10 MG tablet Take 10 mg by mouth 3 (three) times daily.  butalbital-acetaminophen-caffeine (FIORICET) 50-325-40 MG tablet Take 1 tablet by mouth daily as needed.   desvenlafaxine (PRISTIQ) 100 MG 24 hr tablet Take 150 mg by mouth at bedtime.   desvenlafaxine (PRISTIQ) 50 MG 24 hr tablet Take 50 mg by mouth at bedtime.   diazepam (VALIUM) 5 MG tablet Take 5 mg by mouth at bedtime.   donepezil (ARICEPT) 10 MG tablet Take 10 mg by mouth at bedtime.   EMGALITY 120 MG/ML SOSY Inject 120 mg into the skin every 28 (twenty-eight) days.   EPINEPHrine 0.3 mg/0.3 mL IJ SOAJ injection Inject 0.3 mg into the muscle as needed. For  anaphylactic reaction   gabapentin (NEURONTIN) 400 MG capsule Take 400 mg by mouth 3 (three) times daily.   hydrALAZINE (APRESOLINE) 100 MG tablet Take 1 tablet (100 mg total) by mouth 3 (three) times daily.   INGREZZA 80 MG capsule Take 80 mg by mouth at bedtime.   INVEGA SUSTENNA 234 MG/1.5ML SUSY injection Inject 234 mg into the muscle every 30 (thirty) days.   labetalol (NORMODYNE) 300 MG tablet Take 1 tablet (300 mg total) by mouth 2 (two) times daily.   lamoTRIgine (LAMICTAL) 200 MG tablet Take 200 mg by mouth daily.   levothyroxine (SYNTHROID, LEVOTHROID) 75 MCG tablet Take 1 tablet (75 mcg total) by mouth daily before breakfast.   linaclotide (LINZESS) 72 MCG capsule Take 72 mcg by mouth daily before breakfast.   losartan-hydrochlorothiazide (HYZAAR) 100-25 MG tablet Take 1 tablet by mouth daily.   OLANZapine (ZYPREXA) 2.5 MG tablet Take 1 tablet (2.5 mg total) by mouth at bedtime.   pantoprazole (PROTONIX) 40 MG tablet Take 1 tablet (40 mg total) by mouth 2 (two) times daily.   phenytoin (DILANTIN) 200 MG ER capsule Take 400 mg by mouth daily.    verapamil (CALAN-SR) 240 MG CR tablet Take 1 tablet (240 mg total) by mouth every morning.     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   Drug use: No    Comment: H/O marijuana and cocaine, in 1980s   Sexual activity: Yes    Birth control/protection: Surgical  Other Topics Concern   Not on file   Social History Narrative   ** Merged History Encounter **       Social Determinants of Health   Financial Resource Strain: Not on file  Food Insecurity: Not on file  Transportation Needs: Not on file  Physical Activity: Not on file  Stress: Not on file  Social Connections: Not on file     Family History: The patient's family history includes Arthritis in her brother; Breast cancer in her maternal aunt; Colon cancer in her father, paternal grandfather, and paternal uncle; Diabetes in her sister; Hematuria in her mother; Kidney cancer in her maternal grandmother; Kidney disease in her maternal grandmother. There is no history of Prostate cancer.  ROS:   Please see the history of present illness.     All other systems reviewed and are negative.  EKGs/Labs/Other Studies Reviewed:    The following studies were reviewed today:   EKG:  EKG is  ordered today.  The ekg ordered today demonstrates normal sinus rhythm  Recent Labs: 10/16/2021: TSH 4.097 10/17/2021: ALT 15 10/18/2021: Magnesium 2.1 10/19/2021: B Natriuretic Peptide 55.8 10/20/2021: BUN 16; Creatinine, Ser 1.06; Hemoglobin 10.5; Platelets 178; Potassium 3.2; Sodium 135  Recent Lipid Panel    Component Value Date/Time   CHOL 124 08/28/2021 0549   TRIG 146 08/28/2021 0549   HDL 35 (L) 08/28/2021 0549   CHOLHDL 3.5 08/28/2021 0549   VLDL 29 08/28/2021 0549   LDLCALC 60 08/28/2021 0549     Risk Assessment/Calculations:             Physical Exam:    VS:  BP 124/80 (BP Location: Right Arm, Patient Position: Sitting, Cuff Size: Normal)   Pulse 66   Ht '5\' 2"'$  (1.575 m)   Wt 169 lb 6.4 oz (76.8 kg)   SpO2 95%   BMI 30.98 kg/m     Wt Readings from Last 3 Encounters:  04/18/22 169 lb 6.4 oz (76.8 kg)  10/20/21 177 lb 9.6 oz (80.6 kg)  08/27/21 179 lb 14.3 oz (81.6 kg)     GEN:  Well nourished, well developed in no acute distress HEENT: Normal NECK: No JVD; No carotid bruits CARDIAC: RRR, no murmurs, rubs,  gallops RESPIRATORY:  Clear to auscultation without rales, wheezing or rhonchi  ABDOMEN: Soft, non-tender, non-distended MUSCULOSKELETAL:  No edema; No deformity  SKIN: Warm and dry NEUROLOGIC:  Alert and oriented x 3 PSYCHIATRIC:  Normal affect   ASSESSMENT:    1. Syncope, unspecified syncope type   2. Primary hypertension   3. Pure hypercholesterolemia   4. Smoking    PLAN:    In order of problems listed above:  Syncope, etiology unclear.  Not consistent with orthostasis, BP controlled.  Cardiac workup with echo, Myoview, cardiac monitor were all unrevealing.  Follow-up with PCP regarding trial of Antivert for possible vertigo. Hypertension, BP controlled.  Continue current meds. Hyperlipidemia, cholesterol controlled, continue Lipitor 40 mg daily. Current smoker, smoking cessation advised.  Follow-up in 1 year     Medication Adjustments/Labs and Tests Ordered: Current medicines are reviewed at length with the patient today.  Concerns regarding medicines are outlined above.  Orders Placed This Encounter  Procedures   EKG 12-Lead   No orders of the defined types were placed in this encounter.   Patient Instructions  Medication Instructions:  No changes *If you need a refill on your cardiac medications before your next appointment, please call your pharmacy*   Lab Work: None ordered If you have labs (blood work) drawn today and your tests are completely normal, you will receive your results only by: [place] (if you have MyChart) OR A paper copy in the mail If you have any lab test that is abnormal or we need to change your treatment, we will call you to review the results.   Testing/Procedures: None ordered   Follow-Up: At [facility], you and your health needs are our priority.  As part of our continuing mission to provide you with exceptional heart care, we have created designated Provider Care Teams.  These Care Teams include your primary  Cardiologist (physician) and Advanced Practice Providers (APPs -  Physician Assistants and Nurse Practitioners) who all work together  to provide you with the care you need, when you need it.  We recommend signing up for the patient portal called "MyChart".  Sign up information is provided on this After Visit Summary.  MyChart is used to connect with patients for Virtual Visits (Telemedicine).  Patients are able to view lab/test results, encounter notes, upcoming appointments, etc.  Non-urgent messages can be sent to your provider as well.   To learn more about what you can do with MyChart, go to [email or web address].    Your next appointment:   12 month(s)  The format for your next appointment:   In Person  Provider:   You may see Dr. Launa Grill or one of the following Advanced Practice Providers on your designated Care Team:   Murray Hodgkins, NP Christell Faith, PA-C Cadence Kathlen Mody, PA-C Gerrie Nordmann, NP     Important Information About Sugar         Signed, Kate Sable, MD  04/18/2022 10:11 AM    [place]

## 2022-04-18 NOTE — Patient Instructions (Signed)
Medication Instructions:  No changes *If you need a refill on your cardiac medications before your next appointment, please call your pharmacy*   Lab Work: None ordered If you have labs (blood work) drawn today and your tests are completely normal, you will receive your results only by: [place] (if you have MyChart) OR A paper copy in the mail If you have any lab test that is abnormal or we need to change your treatment, we will call you to review the results.   Testing/Procedures: None ordered   Follow-Up: At [facility], you and your health needs are our priority.  As part of our continuing mission to provide you with exceptional heart care, we have created designated Provider Care Teams.  These Care Teams include your primary Cardiologist (physician) and Advanced Practice Providers (APPs -  Physician Assistants and Nurse Practitioners) who all work together to provide you with the care you need, when you need it.  We recommend signing up for the patient portal called "MyChart".  Sign up information is provided on this After Visit Summary.  MyChart is used to connect with patients for Virtual Visits (Telemedicine).  Patients are able to view lab/test results, encounter notes, upcoming appointments, etc.  Non-urgent messages can be sent to your provider as well.   To learn more about what you can do with MyChart, go to [email or web address].    Your next appointment:   12 month(s)  The format for your next appointment:   In Person  Provider:   You may see Dr. Launa Grill or one of the following Advanced Practice Providers on your designated Care Team:   Murray Hodgkins, NP Christell Faith, PA-C Cadence Kathlen Mody, PA-C Gerrie Nordmann, NP     Important Information About Sugar

## 2022-12-08 ENCOUNTER — Other Ambulatory Visit: Payer: Self-pay | Admitting: Family Medicine

## 2022-12-08 DIAGNOSIS — Z1231 Encounter for screening mammogram for malignant neoplasm of breast: Secondary | ICD-10-CM

## 2023-11-07 ENCOUNTER — Encounter: Payer: Self-pay | Admitting: Cardiology
